# Patient Record
Sex: Female | Born: 1955
Health system: Southern US, Community
[De-identification: ages and names within clinical notes are randomized; demographics above are authoritative.]

## PROBLEM LIST (undated history)

## (undated) DIAGNOSIS — C801 Malignant (primary) neoplasm, unspecified: Secondary | ICD-10-CM

## (undated) DIAGNOSIS — C539 Malignant neoplasm of cervix uteri, unspecified: Secondary | ICD-10-CM

## (undated) DIAGNOSIS — J4520 Mild intermittent asthma, uncomplicated: Secondary | ICD-10-CM

## (undated) DIAGNOSIS — T7840XA Allergy, unspecified, initial encounter: Secondary | ICD-10-CM

## (undated) DIAGNOSIS — K219 Gastro-esophageal reflux disease without esophagitis: Secondary | ICD-10-CM

## (undated) DIAGNOSIS — Z5189 Encounter for other specified aftercare: Secondary | ICD-10-CM

## (undated) DIAGNOSIS — N189 Chronic kidney disease, unspecified: Secondary | ICD-10-CM

## (undated) DIAGNOSIS — Z87442 Personal history of urinary calculi: Secondary | ICD-10-CM

## (undated) HISTORY — DX: Gastro-esophageal reflux disease without esophagitis: K21.9

## (undated) HISTORY — DX: Mild intermittent asthma, uncomplicated: J45.20

## (undated) HISTORY — DX: Chronic kidney disease, unspecified: N18.9

## (undated) HISTORY — DX: Encounter for other specified aftercare: Z51.89

## (undated) HISTORY — DX: Allergy, unspecified, initial encounter: T78.40XA

## (undated) HISTORY — PX: OTHER SURGICAL HISTORY: SHX169

## (undated) HISTORY — PX: HAND SURGERY: SHX662

---
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
## 1998-01-10 ENCOUNTER — Other Ambulatory Visit: Admission: RE | Admit: 1998-01-10 | Discharge: 1998-01-10 | Payer: Self-pay | Admitting: Obstetrics & Gynecology

## 1998-02-12 ENCOUNTER — Ambulatory Visit (HOSPITAL_COMMUNITY): Admission: RE | Admit: 1998-02-12 | Discharge: 1998-02-12 | Payer: Self-pay | Admitting: Obstetrics & Gynecology

## 1998-04-17 ENCOUNTER — Other Ambulatory Visit: Admission: RE | Admit: 1998-04-17 | Discharge: 1998-04-17 | Payer: Self-pay | Admitting: Obstetrics & Gynecology

## 1998-12-09 ENCOUNTER — Other Ambulatory Visit: Admission: RE | Admit: 1998-12-09 | Discharge: 1998-12-09 | Payer: Self-pay | Admitting: Obstetrics & Gynecology

## 2000-03-04 ENCOUNTER — Other Ambulatory Visit: Admission: RE | Admit: 2000-03-04 | Discharge: 2000-03-04 | Payer: Self-pay | Admitting: Obstetrics & Gynecology

## 2001-07-01 ENCOUNTER — Other Ambulatory Visit: Admission: RE | Admit: 2001-07-01 | Discharge: 2001-07-01 | Payer: Self-pay | Admitting: Obstetrics & Gynecology

## 2002-10-19 ENCOUNTER — Other Ambulatory Visit: Admission: RE | Admit: 2002-10-19 | Discharge: 2002-10-19 | Payer: Self-pay | Admitting: Obstetrics & Gynecology

## 2004-03-10 ENCOUNTER — Other Ambulatory Visit: Admission: RE | Admit: 2004-03-10 | Discharge: 2004-03-10 | Payer: Self-pay | Admitting: Obstetrics & Gynecology

## 2005-02-12 ENCOUNTER — Emergency Department (HOSPITAL_COMMUNITY): Admission: EM | Admit: 2005-02-12 | Discharge: 2005-02-12 | Payer: Self-pay | Admitting: Emergency Medicine

## 2006-07-03 ENCOUNTER — Emergency Department (HOSPITAL_COMMUNITY): Admission: EM | Admit: 2006-07-03 | Discharge: 2006-07-04 | Payer: Self-pay | Admitting: Emergency Medicine

## 2006-07-05 ENCOUNTER — Ambulatory Visit: Payer: Self-pay | Admitting: Internal Medicine

## 2006-07-05 ENCOUNTER — Ambulatory Visit (HOSPITAL_COMMUNITY): Admission: RE | Admit: 2006-07-05 | Discharge: 2006-07-06 | Payer: Self-pay | Admitting: Orthopedic Surgery

## 2006-12-17 ENCOUNTER — Ambulatory Visit: Payer: Self-pay | Admitting: Internal Medicine

## 2007-04-29 DIAGNOSIS — K219 Gastro-esophageal reflux disease without esophagitis: Secondary | ICD-10-CM | POA: Insufficient documentation

## 2007-04-29 DIAGNOSIS — J309 Allergic rhinitis, unspecified: Secondary | ICD-10-CM | POA: Insufficient documentation

## 2007-09-21 ENCOUNTER — Ambulatory Visit: Payer: Self-pay | Admitting: Internal Medicine

## 2008-03-05 ENCOUNTER — Emergency Department (HOSPITAL_COMMUNITY): Admission: EM | Admit: 2008-03-05 | Discharge: 2008-03-05 | Payer: Self-pay | Admitting: Emergency Medicine

## 2008-03-13 ENCOUNTER — Ambulatory Visit: Payer: Self-pay | Admitting: Internal Medicine

## 2008-03-13 DIAGNOSIS — M25569 Pain in unspecified knee: Secondary | ICD-10-CM | POA: Insufficient documentation

## 2008-03-14 ENCOUNTER — Ambulatory Visit: Payer: Self-pay | Admitting: Internal Medicine

## 2008-03-26 ENCOUNTER — Telehealth: Payer: Self-pay | Admitting: Internal Medicine

## 2008-03-28 ENCOUNTER — Ambulatory Visit: Payer: Self-pay | Admitting: Internal Medicine

## 2009-03-20 ENCOUNTER — Emergency Department (HOSPITAL_COMMUNITY): Admission: EM | Admit: 2009-03-20 | Discharge: 2009-03-20 | Payer: Self-pay | Admitting: Emergency Medicine

## 2010-12-26 NOTE — Op Note (Signed)
NAMEZAYLEY, ARRAS                 ACCOUNT NO.:  [id number]   MEDICAL RECORD NO.:  [id number]          PATIENT TYPE:  OIB   LOCATION:  5015                         FACILITY:  [facility]   PHYSICIAN:  Nadara Mustard, MD     DATE OF BIRTH:  [birth year]   DATE OF PROCEDURE:  07/05/2006  DATE OF DISCHARGE:                               OPERATIVE REPORT   PREOPERATIVE DIAGNOSIS:  Abscess right hand.   PROCEDURE:  Irrigation debridement abscess right hand.   SURGEON.:  Nadara Mustard, M.D.   ANESTHESIA:  General.   ESTIMATED BLOOD LOSS:  Minimal.   ANTIBIOTICS:  Vancomycin 1 gram started intraoperatively. Preoperatively  the patient was on Augmentin. Cultures were obtained x2.   TOURNIQUET TIME:  None.   DISPOSITION:  To PACU in stable condition.   INDICATIONS FOR PROCEDURE:  The patient is a 55 year old woman who 5  days ago had a fragment of glass get stuck in her right hand.  The  patient went to [facility] emergency room after the  trauma and after she had removed the glass and states that she received  an IM antibiotic injection and was appropriately started on Augmentin.  The patient has had progressive  redness and swelling despite  appropriate antibiotic care and was see my office today. Examination  showed a large swelling abscess over the dorsal aspect of the first web  space right hand.  She also had some swelling in the thenar eminence.  Due to the patient's swelling and abscess despite appropriate antibiotic  treatment, the patient presents at this time for irrigation debridement  of the abscess.  Risks and benefits were discussed including persistent  infection, neurovascular injury, need for additional surgery.  The  patient states she understands and wished proceed at this time.   DESCRIPTION OF PROCEDURE:  The patient was brought to OR room one and  underwent general anesthetic.  After adequate level of anesthesia  obtained the patient's right  upper extremity was prepped using DuraPrep  and draped into a sterile field.  An incision was made over the thenar  eminence in line with the skin creases.  Blunt dissection was carried  down and this was irrigated.  There was no purulence, no abscess, no  foreign body.  A second incision was then made dorsally over the first  web space.  There was a large purulent abscess.  Cultures were obtained  x2.  This was irrigated with normal saline. There was no foreign body.  After irrigation debridement, the extent of the abscess was irrigated  and there was no further pockets of purulence.  A one-quarter inch  Penrose drain was placed in both wounds after irrigation and the wounds  were loosely closed with 2-0 nylon.  The wounds were  covered with Adaptic, orthopedic sponges, sterile Webril, ABD and a  Coban dressing.  The patient was extubated, taken to PACU in stable  condition.  Plan for IV vancomycin. Plan on discharge most likely with  doxycycline with the presumed infection of MRSA and discharge once she  is  stable and the wound is healing and cellulitis resolving.      Nadara Mustard, MD  Electronically Signed     MVD/MEDQ  D:  07/05/2006  T:  07/06/2006  Job:  [id number]

## 2010-12-26 NOTE — Consult Note (Signed)
Laura Mcdonald, Laura Mcdonald                 ACCOUNT NO.:  [id number]   MEDICAL RECORD NO.:  [id number]          PATIENT TYPE:  EMS   LOCATION:  ED                           FACILITY:  [facility]   PHYSICIAN:  Valetta Mole. Swords, MD    DATE OF BIRTH:  06-May-1956   DATE OF CONSULTATION:  DATE OF DISCHARGE:                                 CONSULTATION   DATE OF EVALUATION:  July 04, 2006.   REQUESTING PHYSICIAN:  Dr. Joen Laura.   HISTORY OF PRESENT ILLNESS:  Ms. Halls is a healthy 55 year old female.  While doing outside work 2 days ago she fell on an outside lantern.  The  lantern broke, and a large piece of glass penetrated the webbing between  her first and second fingers on her right hand.  After removing the  glass she soaked the wound in hydrogen peroxide and alcohol.  Over the  past 2 days she has had progressive swelling of the area.  The area  remains uncomfortable.  She has noted swelling and mild erythema of the  hand, especially the palmar aspect next to the thumb.  She has not noted  any streaking erythema or swelling proximal to the hand.  She denies any  fevers or chills.  The pain is rated as 4/10.  It is much better after  taking oral pain relievers in the Emergency Department.  She has also  been given a dose of Unasyn.   PAST MEDICAL HISTORY:  Essentially unremarkable.   MEDICATIONS:  None.   SOCIAL HISTORY:  She owns a Education officer, environmental business.  She is a nonsmoker.   FAMILY HISTORY:  Noncontributory.   REVIEW OF SYSTEMS:  She denies any fevers, chills, chest pain, shortness  of breath, PND, orthopnea, syncope, rashes.  She denies any other  complaints on a complete review of systems.   PHYSICAL EXAMINATION:  Afebrile, blood pressure 124/64, heart rate 80,  respirations 20.  GENERAL:  She appears as a well-developed, well-nourished female in no  acute distress.  HEENT EXAM:  Atraumatic, normocephalic.  NECK:  Supple without lymphadenopathy, thyromegaly, jugular venous  distention, or carotid bruits.  CHEST:  Clear to auscultation without any increased work of breathing.  CARDIAC EXAM:  S1 and S2 are regular.  ABDOMINAL EXAM:  Normoactive bowel sounds, soft, nontender.  EXTREMITIES:  There is no clubbing, cyanosis, or edema.  There is no  axillary or cervical adenopathy.  On the right hand there is some soft  tissue swelling of the right hand on the flexor and extensor aspects.  There is no swelling proximal to the wrist.  The area is somewhat warm.  There is no drainage from the wound site which is between the first and  second fingers.  There is no pitting edema.  There is no streaking  erythema proximal to the wrist.  There is no axillary adenopathy.  She  has full range of motion of her wrist.   ASSESSMENT AND PLAN:  Puncture wound to the hand.  She has had her  tetanus immunization.  She has been given a dose  of Unasyn.  She is not  having any systemic signs or symptoms.  I do not think she needs any  laboratory evaluation at this time.  I think she needs aggressive  antibiotic therapy which has been given with IV Unasyn.  We  will continue p.o. Augmentin.  I will follow the patient up in  approximately 6 hours and will reevaluate her at that time.  She will  take an Augmentin 875 mg p.o. one-time dose in approximately 4 hours.  She will call me if she has any questions.  I have given her my cell  phone number and pager number.      Bruce Rexene Edison Swords, MD  Electronically Signed     BHS/MEDQ  D:  07/04/2006  T:  07/04/2006  Job:  [id number]   cc:   Joen Laura, MD  [address] [place], [place] [address]

## 2019-10-19 ENCOUNTER — Ambulatory Visit: Payer: Medicare Other | Attending: Internal Medicine

## 2019-10-19 DIAGNOSIS — Z23 Encounter for immunization: Secondary | ICD-10-CM

## 2019-10-19 NOTE — Progress Notes (Signed)
   Covid-19 Vaccination Clinic  Name:  Laura Mcdonald    MRN: [id number] DOB: 11/21/1955  10/19/2019  Laura Mcdonald was observed post Covid-19 immunization for 15 minutes without incident. She was provided with Vaccine Information Sheet and instruction to access the V-Safe system.   Laura Mcdonald was instructed to call 911 with any severe reactions post vaccine: Marland Kitchen Difficulty breathing  . Swelling of face and throat  . A fast heartbeat  . A bad rash all over body  . Dizziness and weakness   Immunizations Administered    Name Date Dose VIS Date Route   Pfizer COVID-19 Vaccine 10/19/2019  8:42 AM 0.3 mL 07/21/2019 Intramuscular   Manufacturer: [place]   Lot: [id number]   [place]: [id number]

## 2019-11-06 ENCOUNTER — Other Ambulatory Visit: Payer: Self-pay

## 2019-11-07 ENCOUNTER — Ambulatory Visit (INDEPENDENT_AMBULATORY_CARE_PROVIDER_SITE_OTHER): Payer: Medicare Other | Admitting: Internal Medicine

## 2019-11-07 ENCOUNTER — Encounter: Payer: Self-pay | Admitting: Gastroenterology

## 2019-11-07 ENCOUNTER — Encounter: Payer: Self-pay | Admitting: Internal Medicine

## 2019-11-07 VITALS — BP 140/90 | HR 71 | Temp 97.2°F | Ht 65.5 in | Wt 202.7 lb

## 2019-11-07 DIAGNOSIS — Z1231 Encounter for screening mammogram for malignant neoplasm of breast: Secondary | ICD-10-CM

## 2019-11-07 DIAGNOSIS — R030 Elevated blood-pressure reading, without diagnosis of hypertension: Secondary | ICD-10-CM

## 2019-11-07 DIAGNOSIS — Z13820 Encounter for screening for osteoporosis: Secondary | ICD-10-CM

## 2019-11-07 DIAGNOSIS — E669 Obesity, unspecified: Secondary | ICD-10-CM | POA: Diagnosis not present

## 2019-11-07 DIAGNOSIS — J4520 Mild intermittent asthma, uncomplicated: Secondary | ICD-10-CM

## 2019-11-07 DIAGNOSIS — Z780 Asymptomatic menopausal state: Secondary | ICD-10-CM

## 2019-11-07 DIAGNOSIS — C539 Malignant neoplasm of cervix uteri, unspecified: Secondary | ICD-10-CM | POA: Insufficient documentation

## 2019-11-07 DIAGNOSIS — Z1211 Encounter for screening for malignant neoplasm of colon: Secondary | ICD-10-CM

## 2019-11-07 NOTE — Patient Instructions (Signed)
-  Nice seeing you today!!  -Pneumonia vaccine 6 weeks after last COVID vaccine.  -Schedule follow up for your annual wellness visit as soon as possible.

## 2019-11-07 NOTE — Progress Notes (Signed)
New Patient Office Visit     This visit occurred during the SARS-CoV-2 public health emergency.  Safety protocols were in place, including screening questions prior to the visit, additional usage of staff PPE, and extensive cleaning of exam room while observing appropriate contact time as indicated for disinfecting solutions.    CC/Reason for Visit: Establish care, discuss chronic conditions Previous PCP: None Last Visit: Unknown  HPI: Laura Mcdonald is a 64 y.o. female who is coming in today for the above mentioned reasons. Past Medical History is significant for: Mild intermittent asthma only uses rescue inhaler, and a history of cervical cancer who has not seen her gynecologist in at least 3 years since he retired.  She has no acute complaints today.  She is here to establish care.  She still works part-time in Probation officer.  She has 5 children, all grown.  She was a smoker but quit 36 years ago, no alcohol, no known drug allergies.  Her past surgical history is significant for 2 ectopic pregnancies and a LEEP procedure for cervical cancer.  Family history significant for a mother with a stroke and a father who is deceased from cancer.   Past Medical/Surgical History: Past Medical History:  Diagnosis Date  . Cervical cancer ([place])   . Mild intermittent asthma     History reviewed. No pertinent surgical history.  Social History:  reports that she has quit smoking. She has never used smokeless tobacco. She reports that she does not drink alcohol or use drugs.  Allergies: No Known Allergies  Family History:  Family History  Problem Relation Age of Onset  . CVA Mother   . Lung cancer Father      Current Outpatient Medications:  .  albuterol (VENTOLIN HFA) 108 (90 Base) MCG/ACT inhaler, Inhale into the lungs every 6 (six) hours as needed for wheezing or shortness of breath., Disp: , Rfl:  .  BLACK ELDERBERRY PO, Take by mouth., Disp: , Rfl:  .  cetirizine (ZYRTEC)  10 MG tablet, Take 10 mg by mouth daily., Disp: , Rfl:  .  Multiple Vitamin (MULTIVITAMIN ADULT PO), Take by mouth., Disp: , Rfl:  .  Thiamine HCl (VITAMIN B-1) 250 MG tablet, Take 250 mg by mouth daily., Disp: , Rfl:   Review of Systems:  Constitutional: Denies fever, chills, diaphoresis, appetite change and fatigue.  HEENT: Denies photophobia, eye pain, redness, hearing loss, ear pain, congestion, sore throat, rhinorrhea, sneezing, mouth sores, trouble swallowing, neck pain, neck stiffness and tinnitus.   Respiratory: Denies SOB, DOE, cough, chest tightness,  and wheezing.   Cardiovascular: Denies chest pain, palpitations and leg swelling.  Gastrointestinal: Denies nausea, vomiting, abdominal pain, diarrhea, constipation, blood in stool and abdominal distention.  Genitourinary: Denies dysuria, urgency, frequency, hematuria, flank pain and difficulty urinating.  Endocrine: Denies: hot or cold intolerance, sweats, changes in hair or nails, polyuria, polydipsia. Musculoskeletal: Denies myalgias, back pain, joint swelling, arthralgias and gait problem.  Skin: Denies pallor, rash and wound.  Neurological: Denies dizziness, seizures, syncope, weakness, light-headedness, numbness and headaches.  Hematological: Denies adenopathy. Easy bruising, personal or family bleeding history  Psychiatric/Behavioral: Denies suicidal ideation, mood changes, confusion, nervousness, sleep disturbance and agitation    Physical Exam: Vitals:   11/07/19 1034  BP: 140/90  Pulse: 71  Temp: (!) 97.2 F (36.2 C)  TempSrc: Temporal  SpO2: 97%  Weight: 202 lb 11.2 oz (91.9 kg)  Height: 5' 5.5" (1.664 m)   Body mass index is 33.[place]  kg/m.  Constitutional: NAD, calm, comfortable Eyes: PERRL, lids and conjunctivae normal, wears lenses ENMT: Mucous membranes are moist.  Respiratory: clear to auscultation bilaterally, no wheezing, no crackles. Normal respiratory effort. No accessory muscle use.  Cardiovascular:  Regular rate and rhythm, no murmurs / rubs / gallops. No extremity edema.  Neurologic: Grossly intact and nonfocal Psychiatric: Normal judgment and insight. Alert and oriented x 3. Normal mood.    Impression and Plan:  Mild intermittent asthma without complication -Well-controlled, does not recall the last time she had to use her rescue inhaler.  Screening for malignant neoplasm of colon  - Plan: Ambulatory referral to Gastroenterology  Malignant neoplasm of cervix, unspecified site [name])  - Plan: Ambulatory referral to Gynecology  Obesity (BMI 30.0-34.9) -Discussed healthy lifestyle, including increased physical activity and better food choices to promote weight loss.  Elevated BP without diagnosis of hypertension -His blood pressure today is 140/90. -Have advised ambulatory blood pressure monitoring, will follow at next in office visit.  Screening for osteoporosis -DEXA scan requested  Encounter for screening mammogram for malignant neoplasm of breast  - Plan: MM Digital Screening    Patient Instructions  -Nice seeing you today!!  -Pneumonia vaccine 6 weeks after last COVID vaccine.  -Schedule follow up for your annual wellness visit as soon as possible.       Lelon Frohlich, MD [facility]

## 2019-11-07 NOTE — Addendum Note (Signed)
Addended by: Westley Hummer B on: 11/07/2019 11:09 AM   Modules accepted: Orders

## 2019-11-13 ENCOUNTER — Ambulatory Visit: Payer: Medicare Other | Attending: Internal Medicine

## 2019-11-13 DIAGNOSIS — Z23 Encounter for immunization: Secondary | ICD-10-CM

## 2019-11-13 NOTE — Progress Notes (Signed)
   Covid-19 Vaccination Clinic  Name:  Laura Mcdonald    MRN: [id number] DOB: 05-Jul-1956  11/13/2019  [name] was observed post Covid-19 immunization for 15 minutes without incident. She was provided with Vaccine Information Sheet and instruction to access the V-Safe system.   [name] was instructed to call 911 with any severe reactions post vaccine: Marland Kitchen Difficulty breathing  . Swelling of face and throat  . A fast heartbeat  . A bad rash all over body  . Dizziness and weakness   Immunizations Administered    Name Date Dose VIS Date Route   Pfizer COVID-19 Vaccine 11/13/2019  8:42 AM 0.3 mL 07/21/2019 Intramuscular   Manufacturer: [place]   Lot: [id number]   [place]: [id number]

## 2019-11-29 ENCOUNTER — Ambulatory Visit (AMBULATORY_SURGERY_CENTER): Payer: Self-pay | Admitting: *Deleted

## 2019-11-29 ENCOUNTER — Other Ambulatory Visit: Payer: Self-pay

## 2019-11-29 VITALS — Temp 97.1°F | Ht 65.5 in | Wt 205.0 lb

## 2019-11-29 DIAGNOSIS — Z1211 Encounter for screening for malignant neoplasm of colon: Secondary | ICD-10-CM

## 2019-11-29 MED ORDER — SUTAB 1479-225-188 MG PO TABS: 24.0000 | ORAL_TABLET | ORAL | 0 refills | Status: DC

## 2019-11-29 NOTE — Progress Notes (Signed)
11-13-19 comp covid vaccines   No egg or soy allergy known to patient  No issues with past sedation with any surgeries  or procedures, no intubation problems  No diet pills per patient No home 02 use per patient  No blood thinners per patient  Pt denies issues with constipation  No A fib or A flutter  EMMI video sent to pt's e mail   Due to the COVID-19 pandemic we are asking patients to follow these guidelines. Please only bring one care partner. Please be aware that your care partner may wait in the car in the parking lot or if they feel like they will be too hot to wait in the car, they may wait in the lobby on the 4th floor. All care partners are required to wear a mask the entire time (we do not have any that we can provide them), they need to practice social distancing, and we will do a Covid check for all patient's and care partners when you arrive. Also we will check their temperature and your temperature. If the care partner waits in their car they need to stay in the parking lot the entire time and we will call them on their cell phone when the patient is ready for discharge so they can bring the car to the front of the building. Also all patient's will need to wear a mask into building. Sutab code and coupon

## 2019-12-05 ENCOUNTER — Telehealth: Payer: Self-pay | Admitting: Gastroenterology

## 2019-12-05 DIAGNOSIS — Z1211 Encounter for screening for malignant neoplasm of colon: Secondary | ICD-10-CM

## 2019-12-05 MED ORDER — SUTAB 1479-225-188 MG PO TABS: 24.0000 | ORAL_TABLET | ORAL | 0 refills | Status: DC

## 2019-12-05 NOTE — Telephone Encounter (Signed)
Pt called to inform that [facility] has not yet receive order for sutab. Pt is requesting if prescription can be sent to a different [facility] on [address].

## 2019-12-05 NOTE — Telephone Encounter (Signed)
Sent Sutab to [address]  Lelan Pons pV

## 2019-12-11 NOTE — Telephone Encounter (Signed)
Patient returned your call.

## 2019-12-11 NOTE — Telephone Encounter (Signed)
Pt states that copay for sutab is over $180. She would like something more affordable. Pls call her.

## 2019-12-11 NOTE — Telephone Encounter (Signed)
Coupon Sutab called into [facility], cost $40 per pharmacist.   Patient then called and notified. She will pick it up tomorrow, they are going to order this for her.

## 2019-12-13 ENCOUNTER — Ambulatory Visit (AMBULATORY_SURGERY_CENTER): Payer: Medicare Other | Admitting: Gastroenterology

## 2019-12-13 ENCOUNTER — Encounter: Payer: Self-pay | Admitting: Gastroenterology

## 2019-12-13 ENCOUNTER — Other Ambulatory Visit: Payer: Self-pay

## 2019-12-13 VITALS — BP 152/80 | HR 55 | Temp 96.9°F | Resp 16 | Ht 65.0 in | Wt 205.0 lb

## 2019-12-13 DIAGNOSIS — K635 Polyp of colon: Secondary | ICD-10-CM

## 2019-12-13 DIAGNOSIS — K6289 Other specified diseases of anus and rectum: Secondary | ICD-10-CM

## 2019-12-13 DIAGNOSIS — Z1211 Encounter for screening for malignant neoplasm of colon: Secondary | ICD-10-CM

## 2019-12-13 DIAGNOSIS — D125 Benign neoplasm of sigmoid colon: Secondary | ICD-10-CM

## 2019-12-13 DIAGNOSIS — D122 Benign neoplasm of ascending colon: Secondary | ICD-10-CM

## 2019-12-13 DIAGNOSIS — D123 Benign neoplasm of transverse colon: Secondary | ICD-10-CM

## 2019-12-13 DIAGNOSIS — D128 Benign neoplasm of rectum: Secondary | ICD-10-CM | POA: Diagnosis not present

## 2019-12-13 MED ORDER — SODIUM CHLORIDE 0.9 % IV SOLN: 500.0000 mL | Freq: Once | INTRAVENOUS | Status: DC

## 2019-12-13 NOTE — Op Note (Signed)
[place] Patient Name: Laura Mcdonald Procedure Date: 12/13/2019 7:02 AM MRN: [id number] Endoscopist: Mauri Pole , MD Age: 64 Referring MD:  Date of Birth: [birth year] Gender: Female Account #: [id number] Procedure:                Colonoscopy Indications:              Screening for colorectal malignant neoplasm Medicines:                Monitored Anesthesia Care Procedure:                Pre-Anesthesia Assessment:                           - Prior to the procedure, a History and Physical                            was performed, and patient medications and                            allergies were reviewed. The patient's tolerance of                            previous anesthesia was also reviewed. The risks                            and benefits of the procedure and the sedation                            options and risks were discussed with the patient.                            All questions were answered, and informed consent                            was obtained. Prior Anticoagulants: The patient has                            taken no previous anticoagulant or antiplatelet                            agents. ASA Grade Assessment: II - A patient with                            mild systemic disease. After reviewing the risks                            and benefits, the patient was deemed in                            satisfactory condition to undergo the procedure.                           After obtaining informed consent, the colonoscope  was passed under direct vision. Throughout the                            procedure, the patient's blood pressure, pulse, and                            oxygen saturations were monitored continuously. The                            Colonoscope was introduced through the anus and                            advanced to the the cecum, identified by                            appendiceal orifice and  ileocecal valve. The                            colonoscopy was performed without difficulty. The                            patient tolerated the procedure well. The quality                            of the bowel preparation was excellent. The                            ileocecal valve, appendiceal orifice, and rectum                            were photographed. Scope In: 8:04:53 AM Scope Out: 8:29:51 AM Scope Withdrawal Time: 0 hours 21 minutes 22 seconds  Total Procedure Duration: 0 hours 24 minutes 58 seconds  Findings:                 The perianal and digital rectal examinations were                            normal.                           Four sessile polyps were found in the rectum,                            sigmoid colon and hepatic flexure. The polyps were                            5 to 11 mm in size. These polyps were removed with                            a cold snare. Resection and retrieval were complete.                           Three sessile polyps were found in the transverse  colon and ascending colon. The polyps were 1 to 2                            mm in size. These polyps were removed with a cold                            biopsy forceps. Resection and retrieval were                            complete.                           A 20 mm polypoid lesion was found in the rectum,                            15cm from anal verge. The lesion was infiltrative.                            No bleeding was present. This was biopsied with a                            cold forceps for histology. Opposite fold area was                            tattooed with an injection of 1 mL of Spot (carbon                            black).                           A few small-mouthed diverticula were found in the                            sigmoid colon.                           Non-bleeding internal hemorrhoids were found during                             retroflexion. The hemorrhoids were small. Complications:            No immediate complications. Estimated Blood Loss:     Estimated blood loss was minimal. Impression:               - Four 5 to 11 mm polyps in the rectum, in the                            sigmoid colon and at the hepatic flexure, removed                            with a cold snare. Resected and retrieved.                           - Three 1 to 2 mm polyps in  the transverse colon                            and in the ascending colon, removed with a cold                            biopsy forceps. Resected and retrieved.                           - Rule out malignancy, polypoid lesion in the                            rectum. Biopsied. Tattooed.                           - Diverticulosis in the sigmoid colon.                           - Non-bleeding internal hemorrhoids. Recommendation:           - Patient has a contact number available for                            emergencies. The signs and symptoms of potential                            delayed complications were discussed with the                            patient. Return to normal activities tomorrow.                            Written discharge instructions were provided to the                            patient.                           - Resume previous diet.                           - Continue present medications.                           - Await pathology results.                           - Repeat colonoscopy date to be determined after                            pending pathology results are reviewed for                            surveillance based on pathology results. Mauri Pole, MD 12/13/2019 8:36:57 AM This report has been signed electronically.

## 2019-12-13 NOTE — Patient Instructions (Addendum)
NO NSAIDS FOR 2 WEEKS - NO ASPIRIN ADVIL,MOTRIN,ALEVE  Handouts on polyps, diverticulosis,& hemorrhoids given to you today  Await pathology results on polyps removed and on biopsies done      YOU HAD AN ENDOSCOPIC PROCEDURE TODAY AT [place]:   Refer to the procedure report that was given to you for any specific questions about what was found during the examination.  If the procedure report does not answer your questions, please call your gastroenterologist to clarify.  If you requested that your care partner not be given the details of your procedure findings, then the procedure report has been included in a sealed envelope for you to review at your convenience later.  YOU SHOULD EXPECT: Some feelings of bloating in the abdomen. Passage of more gas than usual.  Walking can help get rid of the air that was put into your GI tract during the procedure and reduce the bloating. If you had a lower endoscopy (such as a colonoscopy or flexible sigmoidoscopy) you may notice spotting of blood in your stool or on the toilet paper. If you underwent a bowel prep for your procedure, you may not have a normal bowel movement for a few days.  Please Note:  You might notice some irritation and congestion in your nose or some drainage.  This is from the oxygen used during your procedure.  There is no need for concern and it should clear up in a day or so.  SYMPTOMS TO REPORT IMMEDIATELY:   Following lower endoscopy (colonoscopy or flexible sigmoidoscopy):  Excessive amounts of blood in the stool  Significant tenderness or worsening of abdominal pains  Swelling of the abdomen that is new, acute  Fever of 100F or higher   Following upper endoscopy (EGD)  Vomiting of blood or coffee ground material  New chest pain or pain under the shoulder blades  Painful or persistently difficult swallowing  New shortness of breath  Fever of 100F or higher  Black, tarry-looking stools  For  urgent or emergent issues, a gastroenterologist can be reached at any hour by calling [phone]. Do not use MyChart messaging for urgent concerns.    DIET:  We do recommend a small meal at first, but then you may proceed to your regular diet.  Drink plenty of fluids but you should avoid alcoholic beverages for 24 hours.  ACTIVITY:  You should plan to take it easy for the rest of today and you should NOT DRIVE or use heavy machinery until tomorrow (because of the sedation medicines used during the test).    FOLLOW UP: Our staff will call the number listed on your records 48-72 hours following your procedure to check on you and address any questions or concerns that you may have regarding the information given to you following your procedure. If we do not reach you, we will leave a message.  We will attempt to reach you two times.  During this call, we will ask if you have developed any symptoms of COVID 19. If you develop any symptoms (ie: fever, flu-like symptoms, shortness of breath, cough etc.) before then, please call [phone].  If you test positive for Covid 19 in the 2 weeks post procedure, please call and report this information to [place].    If any biopsies were taken you will be contacted by phone or by letter within the next 1-3 weeks.  Please call us at [phone] if you have not heard about the biopsies in 3  weeks.    SIGNATURES/CONFIDENTIALITY: You and/or your care partner have signed paperwork which will be entered into your electronic medical record.  These signatures attest to the fact that that the information above on your After Visit Summary has been reviewed and is understood.  Full responsibility of the confidentiality of this discharge information lies with you and/or your care-partner.

## 2019-12-13 NOTE — Progress Notes (Signed)
Temp check by:JB Vital check by:DT  The patient states no changes in medical or surgical history since pre-visit screening on 11/29/19.

## 2019-12-13 NOTE — Progress Notes (Signed)
A and O x3. Report to RN. Tolerated MAC anesthesia well.

## 2019-12-13 NOTE — Progress Notes (Signed)
Called to room to assist during endoscopic procedure.  Patient ID and intended procedure confirmed with present staff. Received instructions for my participation in the procedure from the performing physician.  

## 2019-12-15 ENCOUNTER — Telehealth: Payer: Self-pay | Admitting: *Deleted

## 2019-12-15 NOTE — Telephone Encounter (Signed)
  Follow up Call-  Call back number 12/13/2019  Post procedure Call Back phone  # [phone]  Permission to leave phone message Yes  Some recent data might be hidden     Patient questions:  Do you have a fever, pain , or abdominal swelling? No. Pain Score  0 *  Have you tolerated food without any problems? Yes.    Have you been able to return to your normal activities? Yes.    Do you have any questions about your discharge instructions: Diet   No. Medications  No. Follow up visit  No.  Do you have questions or concerns about your Care? No.  Actions: * If pain score is 4 or above: No action needed, pain <4.  1. Have you developed a fever since your procedure? no  2.   Have you had an respiratory symptoms (SOB or cough) since your procedure? no  3.   Have you tested positive for COVID 19 since your procedure no  4.   Have you had any family members/close contacts diagnosed with the COVID 19 since your procedure?  no   If yes to any of these questions please route to Joylene John, RN and Erenest Rasher, RN

## 2019-12-28 ENCOUNTER — Ambulatory Visit (INDEPENDENT_AMBULATORY_CARE_PROVIDER_SITE_OTHER): Payer: Medicare Other | Admitting: Gastroenterology

## 2019-12-28 ENCOUNTER — Encounter: Payer: Self-pay | Admitting: Gastroenterology

## 2019-12-28 ENCOUNTER — Other Ambulatory Visit (INDEPENDENT_AMBULATORY_CARE_PROVIDER_SITE_OTHER): Payer: Medicare Other

## 2019-12-28 VITALS — BP 140/74 | HR 68 | Ht 65.5 in | Wt 205.4 lb

## 2019-12-28 DIAGNOSIS — D128 Benign neoplasm of rectum: Secondary | ICD-10-CM

## 2019-12-28 DIAGNOSIS — K621 Rectal polyp: Secondary | ICD-10-CM | POA: Diagnosis not present

## 2019-12-28 DIAGNOSIS — R933 Abnormal findings on diagnostic imaging of other parts of digestive tract: Secondary | ICD-10-CM

## 2019-12-28 LAB — BASIC METABOLIC PANEL
BUN: 13 mg/dL (ref 6–23)
CO2: 24 mEq/L (ref 19–32)
Calcium: 10.3 mg/dL (ref 8.4–10.5)
Chloride: 108 mEq/L (ref 96–112)
Creatinine, Ser: 0.81 mg/dL (ref 0.40–1.20)
GFR: 85.57 mL/min (ref >60.00)
Glucose, Bld: 105 mg/dL — ABNORMAL HIGH (ref 70–99)
Potassium: 3.9 mEq/L (ref 3.5–5.1)
Sodium: 137 mEq/L (ref 135–145)

## 2019-12-28 LAB — PROTIME-INR
INR: 1.1 ratio — ABNORMAL HIGH (ref 0.8–1.0)
Prothrombin Time: 12.2 s (ref 9.6–13.1)

## 2019-12-28 LAB — CBC
HCT: 41.4 % (ref 36.0–46.0)
Hemoglobin: 14 g/dL (ref 12.0–15.0)
MCHC: 33.7 g/dL (ref 30.0–36.0)
MCV: 86.9 fl (ref 78.0–100.0)
Platelets: 311 10*3/uL (ref 150.0–400.0)
RBC: 4.77 Mil/uL (ref 3.87–5.11)
RDW: 13.4 % (ref 11.5–15.5)
WBC: 9.5 10*3/uL (ref 4.0–10.5)

## 2019-12-28 MED ORDER — SUTAB 1479-225-188 MG PO TABS: 24.0000 | ORAL_TABLET | ORAL | 0 refills | Status: DC

## 2019-12-28 NOTE — Patient Instructions (Addendum)
You have been scheduled for an endoscopy. Please follow written instructions given to you at your visit today. If you use inhalers (even only as needed), please bring them with you on the day of your procedure.   If you are age 66 or older, your body mass index should be between 23-30. Your Body mass index is 33.66 kg/m. If this is out of the aforementioned range listed, please consider follow up with your Primary Care Provider.  If you are age 76 or younger, your body mass index should be between 19-25. Your Body mass index is 33.66 kg/m. If this is out of the aformentioned range listed, please consider follow up with your Primary Care Provider.   We have sent the following medications to your pharmacy for you to pick up at your convenience: Sutab  Your provider has requested that you go to the basement level for lab work before leaving today. Press "B" on the elevator. The lab is located at the first door on the left as you exit the elevator.  Due to recent changes in healthcare laws, you may see the results of your imaging and laboratory studies on MyChart before your provider has had a chance to review them.  We understand that in some cases there may be results that are confusing or concerning to you. Not all laboratory results come back in the same time frame and the provider may be waiting for multiple results in order to interpret others.  Please give [place] 48 hours in order for your provider to thoroughly review all the results before contacting the office for clarification of your results.   Thank you for choosing me and [facility].  Dr. Rush Landmark

## 2019-12-31 DIAGNOSIS — K621 Rectal polyp: Secondary | ICD-10-CM | POA: Insufficient documentation

## 2019-12-31 DIAGNOSIS — R933 Abnormal findings on diagnostic imaging of other parts of digestive tract: Secondary | ICD-10-CM | POA: Insufficient documentation

## 2019-12-31 NOTE — Progress Notes (Signed)
GASTROENTEROLOGY OUTPATIENT CLINIC VISIT   Primary Care Provider Isaac Bliss, Rayford Halsted, MD [place] [address] [phone]  Referring Provider Dr. Silverio Decamp  Patient Profile: Laura Mcdonald is a 64 y.o. female with a pmh significant for allergies, asthma, prior cervical cancer, GERD, diverticulosis, adenomatous colon polyps.  The patient presents to the [facility] for an evaluation and management of problem(s) noted below:  Problem List 1. Adenomatous rectal polyp   2. Rectal polyp   3. Abnormal colonoscopy     History of Present Illness The patient presented earlier this month to Dr. Silverio Decamp for a screening colonoscopy.  At that time multiple polyps were found as well as abnormal polyp in the proximal rectum.  This was biopsied and returned as a tubular adenoma with high-grade dysplasia.  Due to the size as well as the surrounding mucosa, it was felt that an attempt at advanced polyp resection may be best.  It is for this reason that the patient is referred for further discussion of potential polypectomy.  Patient has no other significant GI complaints at this time.  She does have some concern regards to the finding but is hopeful that this can be taken care of.  She understands that this is a precancerous lesion.  GI Review of Systems Positive as above Negative for dysphagia, odynophagia, pain, change in bowel habits, melena, hematochezia  Review of Systems General: Denies fevers/chills/weight loss unintentionally HEENT: Denies oral lesions Cardiovascular: Denies chest pain Pulmonary: Denies shortness of breath Gastroenterological: See HPI Genitourinary: Denies darkened urine Hematological: Denies easy bruising/bleeding Dermatological: Denies jaundice Psychological: Mood is stable   Medications Current Outpatient Medications  Medication Sig Dispense Refill  . albuterol (VENTOLIN HFA) 108 (90 Base) MCG/ACT inhaler  Inhale into the lungs every 6 (six) hours as needed for wheezing or shortness of breath.    . APPLE CIDER VINEGAR PO Take by mouth. 2 gummies a day    . BLACK ELDERBERRY PO Take by mouth.    . cetirizine (ZYRTEC) 10 MG tablet Take 10 mg by mouth daily.    . cholecalciferol (VITAMIN D3) 25 MCG (1000 UNIT) tablet Take 1,000 Units by mouth daily.    . Multiple Vitamin (MULTIVITAMIN ADULT PO) Take by mouth.    . Thiamine HCl (VITAMIN B-1) 250 MG tablet Take 250 mg by mouth daily.    . Sodium Sulfate-Mag Sulfate-KCl (SUTAB) [phone] MG TABS Take 24 tablets by mouth as directed. 24 tablet 0   No current facility-administered medications for this visit.    Allergies No Known Allergies  Histories Past Medical History:  Diagnosis Date  . Allergy   . Cervical cancer ([place])   . GERD (gastroesophageal reflux disease)    past hx but better wirg weight loss   . Mild intermittent asthma    Past Surgical History:  Procedure Laterality Date  . cervical cancer surgery  1995~   Dr Stann Mainland   . HAND SURGERY Right   . tubal preganancy     Social History   Socioeconomic History  . Marital status: Married    Spouse name: Not on file  . Number of children: Not on file  . Years of education: Not on file  . Highest education level: Not on file  Occupational History  . Not on file  Tobacco Use  . Smoking status: Former Research scientist (life sciences)  . Smokeless tobacco: Never Used  Substance and Sexual Activity  . Alcohol use: Never  . Drug use: Never  .  Sexual activity: Not on file  Other Topics Concern  . Not on file  Social History Narrative  . Not on file   Social Determinants of Health   Financial Resource Strain:   . Difficulty of Paying Living Expenses:   Food Insecurity:   . Worried About Charity fundraiser in the Last Year:   . Arboriculturist in the Last Year:   Transportation Needs:   . Film/video editor (Medical):   Marland Kitchen Lack of Transportation (Non-Medical):   Physical Activity:   . Days  of Exercise per Week:   . Minutes of Exercise per Session:   Stress:   . Feeling of Stress :   Social Connections:   . Frequency of Communication with Friends and Family:   . Frequency of Social Gatherings with Friends and Family:   . Attends Religious Services:   . Active Member of Clubs or Organizations:   . Attends Archivist Meetings:   Marland Kitchen Marital Status:   Intimate Partner Violence:   . Fear of Current or Ex-Partner:   . Emotionally Abused:   Marland Kitchen Physically Abused:   . Sexually Abused:    Family History  Problem Relation Age of Onset  . CVA Mother   . Lung cancer Father   . Colon cancer Neg Hx   . Colon polyps Neg Hx   . Esophageal cancer Neg Hx   . Stomach cancer Neg Hx   . Rectal cancer Neg Hx    I have reviewed her medical, social, and family history in detail and updated the electronic medical record as necessary.    PHYSICAL EXAMINATION  BP 140/74   Pulse 68   Ht 5' 5.5" (1.664 m)   Wt 205 lb 6.4 oz (93.2 kg)   BMI 33.66 kg/m  Wt Readings from Last 3 Encounters:  12/28/19 205 lb 6.4 oz (93.2 kg)  12/13/19 205 lb (93 kg)  11/29/19 205 lb (93 kg)  GEN: NAD, appears stated age, doesn't appear chronically ill PSYCH: Cooperative, without pressured speech EYE: Conjunctivae pink, sclerae anicteric ENT: MMM CV: Nontachycardic RESP: No audible wheezing present GI: Soft, NT/ND, without rebound or guarding MSK/EXT: No lower extremity edema SKIN: No jaundice NEURO:  Alert & Oriented x 3, no focal deficits   REVIEW OF DATA  I reviewed the following data at the time of this encounter:  GI Procedures and Studies  May 2021 colonoscopy - Four 5 to 11 mm polyps in the rectum, in the sigmoid colon and at the hepatic flexure, removed with a cold snare. Resected and retrieved. - Three 1 to 2 mm polyps in the transverse colon and in the ascending colon, removed with a cold biopsy forceps. Resected and retrieved. - Rule out malignancy, polypoid lesion in the  rectum. Biopsied. Tattooed. - Diverticulosis in the sigmoid colon. - Non-bleeding internal hemorrhoids. Pathology Diagnosis 1. Colon, polyp(s), transverse, hepatic flexure and ascending, polyp (4) - TUBULAR ADENOMA(S) WITHOUT HIGH-GRADE DYSPLASIA OR MALIGNANCY - SESSILE SERRATED ADENOMA(S) WITHOUT CYTOLOGIC DYSPLASIA 2. Colon, polyp(s), sigmoid, rectum, polyps (3) - HYPERPLASTIC POLYP(S) 3. Rectum, biopsy, polyp - TUBULAR ADENOMA(S) WITH HIGH-GRADE DYSPLASIA. SEE NOTE  Laboratory Studies  Reviewed those in epic  Imaging Studies  No relevant studies to review   ASSESSMENT  Laura Mcdonald is a 64 y.o. female with a pmh significant for allergies, asthma, prior cervical cancer, GERD, diverticulosis, adenomatous colon polyps.  The patient is seen today for evaluation and management of:  1. Adenomatous rectal polyp  2. Rectal polyp   3. Abnormal colonoscopy    The patient is clinically and hemodynamically stable.  Based upon the description and endoscopic pictures I do feel that it is reasonable to pursue an Advanced Polypectomy attempt of the polyp/lesion.  We discussed some of the techniques of advanced polypectomy which include Endoscopic Mucosal Resection, OVESCO Full-Thickness Resection, Endorotor Morcellation, and Tissue Ablation via Fulguration.  The risks and benefits of endoscopic evaluation were discussed with the patient; these include but are not limited to the risk of perforation, infection, bleeding, missed lesions, lack of diagnosis, severe illness requiring hospitalization, as well as anesthesia and sedation related illnesses.  During attempts at advanced resection, the risks of bleeding and perforation/leak are increased as opposed to diagnostic and screening procedures, and that was discussed with the patient as well.   In addition, I explained that with the possible need for piecemeal resection, subsequent short-interval endoscopic evaluation for follow up and potential  retreatment of the lesion/area may be necessary.  I did offer, a referral to surgery in order for patient to have opportunity to discuss surgical management/intervention prior to finalizing decision for attempt at endoscopic removal, however, the patient deferred on this.  If, after attempt at removal of the polyp/lesion, it is found that the patient has a complication or that an invasive lesion or malignant lesion is found, or that the polyp/lesion continues to recur, the patient is aware and understands that surgery may still be indicated/required.  All patient questions were answered, to the best of my ability, and the patient agrees to the aforementioned plan of action with follow-up as indicated.   PLAN  Preprocedure laboratories as outlined below Proceed with scheduling colonoscopy with EMR Follow-up based on final pathology based on resection attempt   Orders Placed This Encounter  Procedures  . Procedural/ Surgical Case Request: COLONOSCOPY WITH PROPOFOL, ENDOSCOPIC MUCOSAL RESECTION  . CBC  . Basic Metabolic Panel (BMET)  . INR/PT  . Ambulatory referral to Gastroenterology    New Prescriptions   SODIUM SULFATE-MAG SULFATE-KCL (SUTAB) [phone] MG TABS    Take 24 tablets by mouth as directed.   Modified Medications   No medications on file    Planned Follow Up No follow-ups on file.   Total Time in Face-to-Face and in Coordination of Care for patient including independent/personal interpretation/review of prior testing, medical history, examination, medication adjustment, communicating results with the patient directly, and documentation with the EHR is 30 minutes.   Justice Britain, MD [facility] Advanced Endoscopy Office # [id number]

## 2019-12-31 NOTE — H&P (View-Only) (Signed)
GASTROENTEROLOGY OUTPATIENT CLINIC VISIT   Primary Care Provider Isaac Bliss, Rayford Halsted, MD [place] [address] [phone]  Referring Provider Dr. Silverio Decamp  Patient Profile: Laura Mcdonald is a 64 y.o. female with a pmh significant for allergies, asthma, prior cervical cancer, GERD, diverticulosis, adenomatous colon polyps.  The patient presents to the [facility] for an evaluation and management of problem(s) noted below:  Problem List 1. Adenomatous rectal polyp   2. Rectal polyp   3. Abnormal colonoscopy     History of Present Illness The patient presented earlier this month to Dr. Silverio Decamp for a screening colonoscopy.  At that time multiple polyps were found as well as abnormal polyp in the proximal rectum.  This was biopsied and returned as a tubular adenoma with high-grade dysplasia.  Due to the size as well as the surrounding mucosa, it was felt that an attempt at advanced polyp resection may be best.  It is for this reason that the patient is referred for further discussion of potential polypectomy.  Patient has no other significant GI complaints at this time.  She does have some concern regards to the finding but is hopeful that this can be taken care of.  She understands that this is a precancerous lesion.  GI Review of Systems Positive as above Negative for dysphagia, odynophagia, pain, change in bowel habits, melena, hematochezia  Review of Systems General: Denies fevers/chills/weight loss unintentionally HEENT: Denies oral lesions Cardiovascular: Denies chest pain Pulmonary: Denies shortness of breath Gastroenterological: See HPI Genitourinary: Denies darkened urine Hematological: Denies easy bruising/bleeding Dermatological: Denies jaundice Psychological: Mood is stable   Medications Current Outpatient Medications  Medication Sig Dispense Refill  . albuterol (VENTOLIN HFA) 108 (90 Base) MCG/ACT inhaler  Inhale into the lungs every 6 (six) hours as needed for wheezing or shortness of breath.    . APPLE CIDER VINEGAR PO Take by mouth. 2 gummies a day    . BLACK ELDERBERRY PO Take by mouth.    . cetirizine (ZYRTEC) 10 MG tablet Take 10 mg by mouth daily.    . cholecalciferol (VITAMIN D3) 25 MCG (1000 UNIT) tablet Take 1,000 Units by mouth daily.    . Multiple Vitamin (MULTIVITAMIN ADULT PO) Take by mouth.    . Thiamine HCl (VITAMIN B-1) 250 MG tablet Take 250 mg by mouth daily.    . Sodium Sulfate-Mag Sulfate-KCl (SUTAB) [phone] MG TABS Take 24 tablets by mouth as directed. 24 tablet 0   No current facility-administered medications for this visit.    Allergies No Known Allergies  Histories Past Medical History:  Diagnosis Date  . Allergy   . Cervical cancer ([place])   . GERD (gastroesophageal reflux disease)    past hx but better wirg weight loss   . Mild intermittent asthma    Past Surgical History:  Procedure Laterality Date  . cervical cancer surgery  1995~   Dr Stann Mainland   . HAND SURGERY Right   . tubal preganancy     Social History   Socioeconomic History  . Marital status: Married    Spouse name: Not on file  . Number of children: Not on file  . Years of education: Not on file  . Highest education level: Not on file  Occupational History  . Not on file  Tobacco Use  . Smoking status: Former Research scientist (life sciences)  . Smokeless tobacco: Never Used  Substance and Sexual Activity  . Alcohol use: Never  . Drug use: Never  .  Sexual activity: Not on file  Other Topics Concern  . Not on file  Social History Narrative  . Not on file   Social Determinants of Health   Financial Resource Strain:   . Difficulty of Paying Living Expenses:   Food Insecurity:   . Worried About Charity fundraiser in the Last Year:   . Arboriculturist in the Last Year:   Transportation Needs:   . Film/video editor (Medical):   Marland Kitchen Lack of Transportation (Non-Medical):   Physical Activity:   . Days  of Exercise per Week:   . Minutes of Exercise per Session:   Stress:   . Feeling of Stress :   Social Connections:   . Frequency of Communication with Friends and Family:   . Frequency of Social Gatherings with Friends and Family:   . Attends Religious Services:   . Active Member of Clubs or Organizations:   . Attends Archivist Meetings:   Marland Kitchen Marital Status:   Intimate Partner Violence:   . Fear of Current or Ex-Partner:   . Emotionally Abused:   Marland Kitchen Physically Abused:   . Sexually Abused:    Family History  Problem Relation Age of Onset  . CVA Mother   . Lung cancer Father   . Colon cancer Neg Hx   . Colon polyps Neg Hx   . Esophageal cancer Neg Hx   . Stomach cancer Neg Hx   . Rectal cancer Neg Hx    I have reviewed her medical, social, and family history in detail and updated the electronic medical record as necessary.    PHYSICAL EXAMINATION  BP 140/74   Pulse 68   Ht 5' 5.5" (1.664 m)   Wt 205 lb 6.4 oz (93.2 kg)   BMI 33.66 kg/m  Wt Readings from Last 3 Encounters:  12/28/19 205 lb 6.4 oz (93.2 kg)  12/13/19 205 lb (93 kg)  11/29/19 205 lb (93 kg)  GEN: NAD, appears stated age, doesn't appear chronically ill PSYCH: Cooperative, without pressured speech EYE: Conjunctivae pink, sclerae anicteric ENT: MMM CV: Nontachycardic RESP: No audible wheezing present GI: Soft, NT/ND, without rebound or guarding MSK/EXT: No lower extremity edema SKIN: No jaundice NEURO:  Alert & Oriented x 3, no focal deficits   REVIEW OF DATA  I reviewed the following data at the time of this encounter:  GI Procedures and Studies  May 2021 colonoscopy - Four 5 to 11 mm polyps in the rectum, in the sigmoid colon and at the hepatic flexure, removed with a cold snare. Resected and retrieved. - Three 1 to 2 mm polyps in the transverse colon and in the ascending colon, removed with a cold biopsy forceps. Resected and retrieved. - Rule out malignancy, polypoid lesion in the  rectum. Biopsied. Tattooed. - Diverticulosis in the sigmoid colon. - Non-bleeding internal hemorrhoids. Pathology Diagnosis 1. Colon, polyp(s), transverse, hepatic flexure and ascending, polyp (4) - TUBULAR ADENOMA(S) WITHOUT HIGH-GRADE DYSPLASIA OR MALIGNANCY - SESSILE SERRATED ADENOMA(S) WITHOUT CYTOLOGIC DYSPLASIA 2. Colon, polyp(s), sigmoid, rectum, polyps (3) - HYPERPLASTIC POLYP(S) 3. Rectum, biopsy, polyp - TUBULAR ADENOMA(S) WITH HIGH-GRADE DYSPLASIA. SEE NOTE  Laboratory Studies  Reviewed those in epic  Imaging Studies  No relevant studies to review   ASSESSMENT  Ms. Basch is a 64 y.o. female with a pmh significant for allergies, asthma, prior cervical cancer, GERD, diverticulosis, adenomatous colon polyps.  The patient is seen today for evaluation and management of:  1. Adenomatous rectal polyp  2. Rectal polyp   3. Abnormal colonoscopy    The patient is clinically and hemodynamically stable.  Based upon the description and endoscopic pictures I do feel that it is reasonable to pursue an Advanced Polypectomy attempt of the polyp/lesion.  We discussed some of the techniques of advanced polypectomy which include Endoscopic Mucosal Resection, OVESCO Full-Thickness Resection, Endorotor Morcellation, and Tissue Ablation via Fulguration.  The risks and benefits of endoscopic evaluation were discussed with the patient; these include but are not limited to the risk of perforation, infection, bleeding, missed lesions, lack of diagnosis, severe illness requiring hospitalization, as well as anesthesia and sedation related illnesses.  During attempts at advanced resection, the risks of bleeding and perforation/leak are increased as opposed to diagnostic and screening procedures, and that was discussed with the patient as well.   In addition, I explained that with the possible need for piecemeal resection, subsequent short-interval endoscopic evaluation for follow up and potential  retreatment of the lesion/area may be necessary.  I did offer, a referral to surgery in order for patient to have opportunity to discuss surgical management/intervention prior to finalizing decision for attempt at endoscopic removal, however, the patient deferred on this.  If, after attempt at removal of the polyp/lesion, it is found that the patient has a complication or that an invasive lesion or malignant lesion is found, or that the polyp/lesion continues to recur, the patient is aware and understands that surgery may still be indicated/required.  All patient questions were answered, to the best of my ability, and the patient agrees to the aforementioned plan of action with follow-up as indicated.   PLAN  Preprocedure laboratories as outlined below Proceed with scheduling colonoscopy with EMR Follow-up based on final pathology based on resection attempt   Orders Placed This Encounter  Procedures  . Procedural/ Surgical Case Request: COLONOSCOPY WITH PROPOFOL, ENDOSCOPIC MUCOSAL RESECTION  . CBC  . Basic Metabolic Panel (BMET)  . INR/PT  . Ambulatory referral to Gastroenterology    New Prescriptions   SODIUM SULFATE-MAG SULFATE-KCL (SUTAB) [phone] MG TABS    Take 24 tablets by mouth as directed.   Modified Medications   No medications on file    Planned Follow Up No follow-ups on file.   Total Time in Face-to-Face and in Coordination of Care for patient including independent/personal interpretation/review of prior testing, medical history, examination, medication adjustment, communicating results with the patient directly, and documentation with the EHR is 30 minutes.   Justice Britain, MD [facility] Office # [phone]

## 2020-01-05 ENCOUNTER — Other Ambulatory Visit: Payer: Self-pay | Admitting: Obstetrics & Gynecology

## 2020-01-05 DIAGNOSIS — R928 Other abnormal and inconclusive findings on diagnostic imaging of breast: Secondary | ICD-10-CM

## 2020-01-11 ENCOUNTER — Ambulatory Visit
Admission: RE | Admit: 2020-01-11 | Discharge: 2020-01-11 | Disposition: A | Discharge status: Home / Self Care | Payer: Medicare Other | Source: Ambulatory Visit | Attending: Obstetrics & Gynecology | Admitting: Obstetrics & Gynecology

## 2020-01-11 ENCOUNTER — Other Ambulatory Visit (HOSPITAL_COMMUNITY)
Admission: RE | Admit: 2020-01-11 | Discharge: 2020-01-11 | Disposition: A | Discharge status: Home / Self Care | Payer: Medicare Other | Source: Ambulatory Visit | Attending: Gastroenterology | Admitting: Gastroenterology

## 2020-01-11 ENCOUNTER — Other Ambulatory Visit: Payer: Self-pay

## 2020-01-11 DIAGNOSIS — Z20822 Contact with and (suspected) exposure to covid-19: Secondary | ICD-10-CM | POA: Insufficient documentation

## 2020-01-11 DIAGNOSIS — R928 Other abnormal and inconclusive findings on diagnostic imaging of breast: Secondary | ICD-10-CM

## 2020-01-11 DIAGNOSIS — Z01812 Encounter for preprocedural laboratory examination: Secondary | ICD-10-CM | POA: Insufficient documentation

## 2020-01-11 LAB — SARS CORONAVIRUS 2 (TAT 6-24 HRS): SARS Coronavirus 2: NEGATIVE

## 2020-01-12 NOTE — Progress Notes (Addendum)
I was unable to reach Laura Mcdonald by phone.  I left  A message on voice mail.  I instructed the patient to arrive at [facility] entrance at 1215  , register in the [facility]. Do not eat after midnight, follow prep.  I  instructed the patient to take the following medications: Zyrtec.; Albuterol inhaler if needed- bring it with you in the am with just enough water to get them down:  I asked patient to shower, not wear any lotions, powders, cologne, jewelry, piercing, make-up or nail polish.  Wear clean clothes. Brush teeth. I informed patient that there will need to be a driver and someone to stay with him/her for the first 24 hours after procedure   I instructed  patient to call [phone] in the am if there were any questions or problems.

## 2020-01-15 ENCOUNTER — Ambulatory Visit (HOSPITAL_COMMUNITY): Payer: Medicare Other | Admitting: Anesthesiology

## 2020-01-15 ENCOUNTER — Encounter (HOSPITAL_COMMUNITY): Payer: Self-pay | Admitting: Gastroenterology

## 2020-01-15 ENCOUNTER — Other Ambulatory Visit: Payer: Self-pay

## 2020-01-15 ENCOUNTER — Encounter (HOSPITAL_COMMUNITY)
Admission: RE | Disposition: A | Discharge status: Home / Self Care | Payer: Self-pay | Source: Home / Self Care | Attending: Gastroenterology

## 2020-01-15 ENCOUNTER — Ambulatory Visit (HOSPITAL_COMMUNITY)
Admission: RE | Admit: 2020-01-15 | Discharge: 2020-01-15 | Disposition: A | Discharge status: Home / Self Care | Payer: Medicare Other | Attending: Gastroenterology | Admitting: Gastroenterology

## 2020-01-15 DIAGNOSIS — J4520 Mild intermittent asthma, uncomplicated: Secondary | ICD-10-CM | POA: Diagnosis not present

## 2020-01-15 DIAGNOSIS — D128 Benign neoplasm of rectum: Secondary | ICD-10-CM | POA: Diagnosis present

## 2020-01-15 DIAGNOSIS — D123 Benign neoplasm of transverse colon: Secondary | ICD-10-CM | POA: Insufficient documentation

## 2020-01-15 DIAGNOSIS — K5140 Inflammatory polyps of colon without complications: Secondary | ICD-10-CM | POA: Diagnosis not present

## 2020-01-15 DIAGNOSIS — C20 Malignant neoplasm of rectum: Secondary | ICD-10-CM | POA: Insufficient documentation

## 2020-01-15 DIAGNOSIS — K641 Second degree hemorrhoids: Secondary | ICD-10-CM | POA: Insufficient documentation

## 2020-01-15 DIAGNOSIS — Z87891 Personal history of nicotine dependence: Secondary | ICD-10-CM | POA: Diagnosis not present

## 2020-01-15 DIAGNOSIS — D122 Benign neoplasm of ascending colon: Secondary | ICD-10-CM | POA: Diagnosis not present

## 2020-01-15 DIAGNOSIS — K635 Polyp of colon: Secondary | ICD-10-CM

## 2020-01-15 HISTORY — PX: COLONOSCOPY WITH PROPOFOL: SHX5780

## 2020-01-15 HISTORY — PX: BIOPSY: SHX5522

## 2020-01-15 HISTORY — PX: SUBMUCOSAL LIFTING INJECTION: SHX6855

## 2020-01-15 SURGERY — COLONOSCOPY WITH PROPOFOL
Anesthesia: Monitor Anesthesia Care

## 2020-01-15 MED ORDER — LACTATED RINGERS IV SOLN: INTRAVENOUS | Status: DC

## 2020-01-15 MED ORDER — FENTANYL CITRATE (PF) 250 MCG/5ML IJ SOLN
INTRAMUSCULAR | Status: DC | PRN
  Administered 2020-01-15 (×2): 25 ug via INTRAVENOUS

## 2020-01-15 MED ORDER — PROPOFOL 10 MG/ML IV BOLUS
INTRAVENOUS | Status: DC | PRN
  Administered 2020-01-15: 15 mg via INTRAVENOUS
  Administered 2020-01-15: 25 mg via INTRAVENOUS

## 2020-01-15 MED ORDER — PROPOFOL 500 MG/50ML IV EMUL
INTRAVENOUS | Status: DC | PRN
  Administered 2020-01-15 (×2): 200 ug/kg/min via INTRAVENOUS
  Administered 2020-01-15: 150 ug/kg/min via INTRAVENOUS

## 2020-01-15 MED ORDER — GLYCOPYRROLATE PF 0.2 MG/ML IJ SOSY
PREFILLED_SYRINGE | INTRAMUSCULAR | Status: DC | PRN
Start: 2020-01-15 — End: 2020-01-15
  Administered 2020-01-15: .2 mg via INTRAVENOUS

## 2020-01-15 MED ORDER — FENTANYL CITRATE (PF) 100 MCG/2ML IJ SOLN
INTRAMUSCULAR | Status: AC
  Filled 2020-01-15: qty 2

## 2020-01-15 MED ORDER — PHENYLEPHRINE 40 MCG/ML (10ML) SYRINGE FOR IV PUSH (FOR BLOOD PRESSURE SUPPORT)
PREFILLED_SYRINGE | INTRAVENOUS | Status: DC | PRN
  Administered 2020-01-15: 160 ug via INTRAVENOUS
  Administered 2020-01-15: 120 ug via INTRAVENOUS

## 2020-01-15 SURGICAL SUPPLY — 22 items

## 2020-01-15 NOTE — Anesthesia Postprocedure Evaluation (Signed)
Anesthesia Post Note  Patient: Laura Mcdonald  Procedure(s) Performed: COLONOSCOPY WITH PROPOFOL (N/A ) SUBMUCOSAL LIFTING INJECTION BIOPSY     Patient location during evaluation: Endoscopy Anesthesia Type: MAC Level of consciousness: awake and alert Pain management: pain level controlled Vital Signs Assessment: post-procedure vital signs reviewed and stable Respiratory status: spontaneous breathing, nonlabored ventilation and respiratory function stable Cardiovascular status: stable and blood pressure returned to baseline Postop Assessment: no apparent nausea or vomiting Anesthetic complications: no    Last Vitals:  Vitals:   01/15/20 1426 01/15/20 1446  BP: 129/62 (!) 152/79  Pulse: 75   Resp: 12 15  Temp:    SpO2: 100% 100%    Last Pain:  Vitals:   01/15/20 1405  TempSrc: Axillary  PainSc: 0-No pain                 Sky Primo,W. EDMOND

## 2020-01-15 NOTE — Interval H&P Note (Signed)
History and Physical Interval Note:  01/15/2020 12:16 PM  Laura Mcdonald  has presented today for surgery, with the diagnosis of Rectal TA, Rectal Polyp.  The various methods of treatment have been discussed with the patient and family. After consideration of risks, benefits and other options for treatment, the patient has consented to  Procedure(s): COLONOSCOPY WITH PROPOFOL (N/A) ENDOSCOPIC MUCOSAL RESECTION (N/A) as a surgical intervention.  The patient's history has been reviewed, patient examined, no change in status, stable for surgery.  I have reviewed the patient's chart and labs.  Questions were answered to the patient's satisfaction.     [employer]

## 2020-01-15 NOTE — Transfer of Care (Signed)
Immediate Anesthesia Transfer of Care Note  Patient: Laura Mcdonald  Procedure(s) Performed: COLONOSCOPY WITH PROPOFOL (N/A ) SUBMUCOSAL LIFTING INJECTION BIOPSY  Patient Location: Endoscopy Unit  Anesthesia Type:MAC  Level of Consciousness: drowsy  Airway & Oxygen Therapy: Patient Spontanous Breathing and Patient connected to nasal cannula oxygen  Post-op Assessment: Report given to RN and Post -op Vital signs reviewed and stable  Post vital signs: Reviewed and stable  Last Vitals:  Vitals Value Taken Time  BP 94/54 01/15/20 1405  Temp    Pulse 90 01/15/20 1405  Resp 22 01/15/20 1405  SpO2 99 % 01/15/20 1405  Vitals shown include unvalidated device data.  Last Pain:  Vitals:   01/15/20 1233  TempSrc: Oral  PainSc: 0-No pain         Complications: No apparent anesthesia complications

## 2020-01-15 NOTE — Anesthesia Preprocedure Evaluation (Addendum)
Anesthesia Evaluation  Patient identified by MRN, date of birth, ID band Patient awake    Reviewed: Allergy & Precautions, H&P , NPO status , Patient's Chart, lab work & pertinent test results  Airway Mallampati: II  TM Distance: >3 FB Neck ROM: Full    Dental no notable dental hx. (+) Partial Upper, Dental Advisory Given   Pulmonary asthma , former smoker,    Pulmonary exam normal breath sounds clear to auscultation       Cardiovascular negative cardio ROS   Rhythm:Regular Rate:Normal     Neuro/Psych negative neurological ROS  negative psych ROS   GI/Hepatic Neg liver ROS, GERD  ,  Endo/Other  negative endocrine ROS  Renal/GU negative Renal ROS  negative genitourinary   Musculoskeletal   Abdominal   Peds  Hematology negative hematology ROS (+)   Anesthesia Other Findings   Reproductive/Obstetrics negative OB ROS                            Anesthesia Physical Anesthesia Plan  ASA: II  Anesthesia Plan: MAC   Post-op Pain Management:    Induction: Intravenous  PONV Risk Score and Plan: 2 and Propofol infusion and Treatment may vary due to age or medical condition  Airway Management Planned: Simple Face Mask and Natural Airway  Additional Equipment:   Intra-op Plan:   Post-operative Plan:   Informed Consent: I have reviewed the patients History and Physical, chart, labs and discussed the procedure including the risks, benefits and alternatives for the proposed anesthesia with the patient or authorized representative who has indicated his/her understanding and acceptance.     Dental advisory given  Plan Discussed with: CRNA  Anesthesia Plan Comments:         Anesthesia Quick Evaluation

## 2020-01-15 NOTE — Discharge Instructions (Signed)
YOU HAD AN ENDOSCOPIC PROCEDURE TODAY: Refer to the procedure report and other information in the discharge instructions given to you for any specific questions about what was found during the examination. If this information does not answer your questions, please call [facility] office at [phone] to clarify.  ° °YOU SHOULD EXPECT: Some feelings of bloating in the abdomen. Passage of more gas than usual. Walking can help get rid of the air that was put into your GI tract during the procedure and reduce the bloating. If you had a lower endoscopy (such as a colonoscopy or flexible sigmoidoscopy) you may notice spotting of blood in your stool or on the toilet paper. Some abdominal soreness may be present for a day or two, also. ° °DIET: Your first meal following the procedure should be a light meal and then it is ok to progress to your normal diet. A half-sandwich or bowl of soup is an example of a good first meal. Heavy or fried foods are harder to digest and may make you feel nauseous or bloated. Drink plenty of fluids but you should avoid alcoholic beverages for 24 hours. If you had a esophageal dilation, please see attached instructions for diet.   ° °ACTIVITY: Your care partner should take you home directly after the procedure. You should plan to take it easy, moving slowly for the rest of the day. You can resume normal activity the day after the procedure however YOU SHOULD NOT DRIVE, use power tools, machinery or perform tasks that involve climbing or major physical exertion for 24 hours (because of the sedation medicines used during the test).  ° °SYMPTOMS TO REPORT IMMEDIATELY: °A gastroenterologist can be reached at any hour. Please call [phone]  for any of the following symptoms:  °Following lower endoscopy (colonoscopy, flexible sigmoidoscopy) °Excessive amounts of blood in the stool  °Significant tenderness, worsening of abdominal pains  °Swelling of the abdomen that is new, acute  °Fever of 100° or  higher  °Following upper endoscopy (EGD, EUS, ERCP, esophageal dilation) °Vomiting of blood or coffee ground material  °New, significant abdominal pain  °New, significant chest pain or pain under the shoulder blades  °Painful or persistently difficult swallowing  °New shortness of breath  °Black, tarry-looking or red, bloody stools ° °FOLLOW UP:  °If any biopsies were taken you will be contacted by phone or by letter within the next 1-3 weeks. Call [phone]  if you have not heard about the biopsies in 3 weeks.  °Please also call with any specific questions about appointments or follow up tests. ° °

## 2020-01-15 NOTE — Anesthesia Procedure Notes (Signed)
Procedure Name: MAC Date/Time: 01/15/2020 1:05 PM Performed by: Imagene Riches, CRNA Pre-anesthesia Checklist: Patient identified, Emergency Drugs available, Suction available, Patient being monitored and Timeout performed Patient Re-evaluated:Patient Re-evaluated prior to induction Oxygen Delivery Method: Simple face mask

## 2020-01-15 NOTE — Op Note (Signed)
[facility] Patient Name: Laura Mcdonald Procedure Date : 01/15/2020 MRN: [id number] Attending MD: Justice Britain , MD Date of Birth: [birth year] CSN: [id number] Age: 64 Admit Type: Outpatient Procedure:                Colonoscopy Indications:              Excision of colonic polyp Providers:                Justice Britain, MD, Burtis Junes, RN, Laverda Sorenson, Technician Referring MD:             Mauri Pole, MD, Rayford Halsted Isaac Bliss Medicines:                Monitored Anesthesia Care Complications:            No immediate complications. Estimated Blood Loss:     Estimated blood loss was minimal. Procedure:                Pre-Anesthesia Assessment:                           - Prior to the procedure, a History and Physical                            was performed, and patient medications and                            allergies were reviewed. The patient's tolerance of                            previous anesthesia was also reviewed. The risks                            and benefits of the procedure and the sedation                            options and risks were discussed with the patient.                            All questions were answered, and informed consent                            was obtained. Prior Anticoagulants: The patient has                            taken no previous anticoagulant or antiplatelet                            agents. ASA Grade Assessment: II - A patient with                            mild systemic disease. After reviewing the risks  and benefits, the patient was deemed in                            satisfactory condition to undergo the procedure.                           After obtaining informed consent, the colonoscope                            was passed under direct vision. Throughout the                            procedure, the patient's blood pressure, pulse, and                             oxygen saturations were monitored continuously. The                            [id number]-H190DL ([id number] Olympus pediatric colonoscope                            was introduced through the anus and advanced to the                            5 cm into the ileum. The CF-[id number] ([id number])                            Olympus colonoscope was introduced through the anus                            and advanced to the the sigmoid colon for                            evaluation. This was the intended extent. The                            colonoscopy was technically difficult and complex.                            Successful completion of the procedure was aided by                            performing the maneuvers documented (below) in this                            report. The patient tolerated the procedure. The                            quality of the bowel preparation was good. The                            terminal ileum, ileocecal valve, appendiceal  orifice, and rectum were photographed. Scope In: 1:05:13 PM Scope Out: 1:56:21 PM Scope Withdrawal Time: 0 hours 46 minutes 24 seconds  Total Procedure Duration: 0 hours 51 minutes 8 seconds  Findings:      The digital rectal exam findings include palpable rectal nodularity/mass       at the very extent of my digit as well as hemorrhoids.      The in the ileum appeared normal.      Three sessile polyps were found in the transverse colon (1) and       ascending colon (2). The polyps were 3 to 6 mm in size. These polyps       were removed with a cold snare. Resection and retrieval were complete.      A 25 mm polypoid lesion was found in the mid rectum (9-11 cm). There was       tattoo that was seen in the rectum and under the lesion. The lesion was       infiltrative and ulcerated. There was loss of all Pit-Pattern in the       middle of the lesion as well as altered vascularity. It was hard to        palpation in the middle of the lesion. Attempt at Underwater EMR,       allowed some of the lesion to float but did not allow for complete       enclosure of the snare around the polyp (it kept flailing off). I then       attempted a typical EMR lift and the area was successfully injected with       10 mL Orise gel for lesion assessment, but the lesion could not be       lifted adequately. I could not ensnare the polyp. I transitioned to the       Adult Colonoscope and placed a PROVE-IT OVESCO FTRD cap and went to the       lesion. I tried to grasp it in multiple areas, but the lesion did not       want to come completely into the FTRD cap and it kept ripping tissue       from the lesion. Thus an attempt at resection via OVESCO was not       performed. I have a high concern for underlying invasiveness. I       proceeded with obtaining biopsies with a cold forceps for histology.      Normal mucosa was found in the entire colon otherwise.      Non-bleeding non-thrombosed internal hemorrhoids were found during       retroflexion, during perianal exam and during digital exam. The       hemorrhoids were Grade II (internal hemorrhoids that prolapse but reduce       spontaneously). Impression:               - Palpable rectal mass/nodularity at distal tip of                            digit and hemorrhoids found on digital rectal exam.                           - The examined portion of the ileum was normal.                           -  Three 3 to 6 mm polyps in the transverse colon                            and in the ascending colon, removed with a cold                            snare. Resected and retrieved.                           - Rule out malignancy, polypoid lesion in the mid                            rectum (9-11 cm). Attempts at resection noted above                            were not successful. Biopsied.                           - Normal mucosa in the entire examined colon                             otherwise.                           - Non-bleeding non-thrombosed internal hemorrhoids. Recommendation:           - The patient will be observed post-procedure,                            until all discharge criteria are met.                           - Discharge patient to home.                           - Patient has a contact number available for                            emergencies. The signs and symptoms of potential                            delayed complications were discussed with the                            patient. Return to normal activities tomorrow.                            Written discharge instructions were provided to the                            patient.                           - High fiber diet.                           -  Continue present medications.                           - Await pathology results - Rushed today.                           - Repeat colonoscopy for surveillance based on                            pathology results.                           - If pathology is confirmatory of malignancy                            (cancer) then I will recommend and order a                            CT-Chest/Abdomen/Pelvis as well as a Pelvic MRI for                            further staging as well as have a CEA level                            obtained. She will need Oncology/Colorectal Surgery                            referrals.                           - If pathology is not-confirmatory of malignancy                            (cancer) but rather just showing HGD changes or                            precancer, I do not believe that I can offer her an                            endoscopic resection technique that would be                            curative. She will need a CT-Abdomen/Pelvis at a                            minimum and possibly a Pelvic MRI. She will need a                            Colorectal Surgery  referral for TAMIS/TENS vs                            consideration of [facility] ESD attempt.                           -  I think the patient is in excellent, health, so                            if this is malignant true surgical options should                            be entertained for curative intent if possible.                           - The findings and recommendations were discussed                            with the patient.                           - The findings and recommendations were discussed                            with the patient's family. Procedure Code(s):        --- Professional ---                           [phone], 52, Colonoscopy, flexible; with removal of                            tumor(s), polyp(s), or other lesion(s) by snare                            technique                           [id number], 52, Colonoscopy, flexible; with directed                            submucosal injection(s), any substance Diagnosis Code(s):        --- Professional ---                           K62.89, Other specified diseases of anus and rectum                           K64.1, Second degree hemorrhoids                           K63.5, Polyp of colon                           D49.0, Neoplasm of unspecified behavior of                            digestive system CPT copyright 2019 [employer]. All rights reserved. The codes documented in this report are preliminary and upon coder review may  be revised to meet current compliance requirements. Justice Britain, MD 01/15/2020 2:34:45 PM Number of Addenda: 0

## 2020-01-16 ENCOUNTER — Other Ambulatory Visit: Payer: Self-pay

## 2020-01-16 ENCOUNTER — Encounter: Payer: Self-pay | Admitting: Gastroenterology

## 2020-01-16 DIAGNOSIS — D128 Benign neoplasm of rectum: Secondary | ICD-10-CM

## 2020-01-16 LAB — SURGICAL PATHOLOGY

## 2020-01-17 ENCOUNTER — Telehealth: Payer: Self-pay | Admitting: Nurse Practitioner

## 2020-01-17 ENCOUNTER — Other Ambulatory Visit: Payer: Medicare Other

## 2020-01-17 DIAGNOSIS — D128 Benign neoplasm of rectum: Secondary | ICD-10-CM

## 2020-01-17 NOTE — Telephone Encounter (Signed)
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 Laura Mcdonald has been cld and scheduled to see [name] 6/14 at 945am. Pt aware to arrive 15 minutes.

## 2020-01-18 LAB — CEA: CEA: <0.5 ng/mL

## 2020-01-18 NOTE — Progress Notes (Signed)
Spoke with patient regarding referral from Dr. Rush Landmark and abnormal findings.  She is aware of her upcoming appointment with medical oncology on Monday 6/14 at 9:45.  She knows our location.  I explained my role as GI nurse navigator.  She verbalized an understanding.

## 2020-01-19 ENCOUNTER — Telehealth: Payer: Self-pay | Admitting: Hematology

## 2020-01-19 NOTE — Telephone Encounter (Signed)
Created in error

## 2020-01-19 NOTE — Telephone Encounter (Signed)
Laura Mcdonald cld and lft a vm to reschedule her new pt appt. I rescheduled Laura Mcdonald to see Dr. Burr Medico on 6/15 at 8am. I lft the appt date and time on the pt's voicemail.

## 2020-01-22 ENCOUNTER — Ambulatory Visit: Payer: Medicare Other | Admitting: Nurse Practitioner

## 2020-01-22 DIAGNOSIS — C20 Malignant neoplasm of rectum: Secondary | ICD-10-CM | POA: Insufficient documentation

## 2020-01-22 NOTE — Progress Notes (Signed)
[place]   Telephone:(336) [phone] Fax:([phone]) [place] Note   Patient Care Team: Truitt Merle, MD as PCP - General (Hematology) Jonnie Finner, RN as Oncology Nurse Navigator Alla Feeling, NP as Nurse Practitioner (Nurse Practitioner)  Date of Service:  01/23/2020   CHIEF COMPLAINTS/PURPOSE OF CONSULTATION:  Newly Diagnosed Rectal Cancer   REFERRING PHYSICIAN:  Dr Rush Landmark  Oncology History Overview Note  Cancer Staging No matching staging information was found for the patient.    Rectal cancer ([place])  12/13/2019 Procedure   Colonosopy by Dr Silverio Decamp 12/13/19  IMPRESSION - Four 5 to 11 mm polyps in the rectum, in the sigmoid colon and at the hepatic flexure, removed with a cold snare. Resected and retrieved. - Three 1 to 2 mm polyps in the transverse colon and in the ascending colon, removed with a cold biopsy forceps. Resected and retrieved. - Rule out malignancy, polypoid lesion in the rectum. Biopsied. Tattooed. - Diverticulosis in the sigmoid colon. - Non-bleeding internal hemorrhoids.  Diagnosis 1. Colon, polyp(s), transverse, hepatic flexure and ascending, polyp (4) - TUBULAR ADENOMA(S) WITHOUT HIGH-GRADE DYSPLASIA OR MALIGNANCY - SESSILE SERRATED ADENOMA(S) WITHOUT CYTOLOGIC DYSPLASIA 2. Colon, polyp(s), sigmoid, rectum, polyps (3) - HYPERPLASTIC POLYP(S) 3. Rectum, biopsy, polyp - TUBULAR ADENOMA(S) WITH HIGH-GRADE DYSPLASIA. SEE NOTE Diagnosis Note 3. Evidence of invasive carcinoma is not seen in the submitted biopsies but a deeper, more severe process cannot be ruled out.   01/15/2020 Procedure   Colonoscopy by Dr Rush Landmark 01/15/20  IMPRESSION - Palpable rectal mass/nodularity at distal tip of digit and hemorrhoids found on digital rectal exam. - The examined portion of the ileum was normal. - Three 3 to 6 mm polyps in the transverse colon and in the ascending colon, removed with a cold snare. Resected and  retrieved. - Rule out malignancy, polypoid lesion in the mid rectum (9-11 cm). Attempts at resection noted above were not successful. Biopsied. - Normal mucosa in the entire examined colon otherwise. - Non-bleeding non-thrombosed internal hemorrhoids.   01/15/2020 Initial Biopsy   FINAL MICROSCOPIC DIAGNOSIS: 01/15/20  A. COLON, ASCENDING AND TRANSVERSE, POLYPECTOMY:  - Sessile serrated polyp(s) without cytologic dysplasia.  - Diminutive inflammatory polyp.  - Detached fragment of unremarkable stroma.   B. RECTUM, MASS, BIOPSY:  - At least intramucosal adenocarcinoma arising in a background of high  grade dysplasia.   COMMENT:   B.  The depth of invasion cannot be evaluated due to the superficial  nature of the biopsy.  Intradepartmental consultation (Dr. Vic Ripper).       01/22/2020 Initial Diagnosis   Rectal cancer ([facility])      HISTORY OF PRESENTING ILLNESS:  Laura Mcdonald 64 y.o. female is a here because of newly diagnosed rectal cancer. The patient was referred by Dr Rush Landmark. The patient presents to the clinic today accompanied by her daughter.  Her rectal mass was found by screening colonoscopy. This was her first colonoscopy. She denies rectal bleeding or pain before or around this time. She notes abdominal discomfort after certain foods which resolved with tums. Dr Rush Landmark tried to resect the tumor by another colonoscopy but was only able to biopsy so he referred her to me. Her path showed invasive adenocarcinoma. She is set to have staging scans and consult with Dr Marcello Moores about surgical resection.  She notes no current chest pain, abdominal issues, or change in BM. Her breathing issues are baseline. She notes after anesthesia tube use during her most recent colonoscopy  she had mouth and throat pain. She notes this has finally started to resolve.   Socially she is married with 5 adult children. She does not drink. She smoked 1 pack per week from [age] years old and quit  in 1985. 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  They have a PMHx of asthma and year around allergies. She uses rescue inhaler. Her breathing is exacerbates by weight gain. She denies recent GERD in the past 5-6 years. She had cervical cancer by whip surgery. She has also had Tubal pregnancy that lead to removal and hand surgery in the past. She notes her father had lung cancer and was a heavy smoker. Her brother had prostate cancer in his 73s. She notes her sister and daughter had colon polyps removed in the past and her sister also had perforation which required temporary colostomy bag.     REVIEW OF SYSTEMS:    Constitutional: Denies fevers, chills or abnormal night sweats Eyes: Denies blurriness of vision, double vision or watery eyes Ears, nose, mouth, throat, and face: Denies mucositis or sore throat Respiratory: Denies cough, dyspnea (+) wheezes Cardiovascular: Denies palpitation, chest discomfort or lower extremity swelling Gastrointestinal:  Denies nausea, heartburn or change in bowel habits Skin: Denies abnormal skin rashes Lymphatics: Denies new lymphadenopathy or easy bruising Neurological:Denies numbness, tingling or new weaknesses Behavioral/Psych: Mood is stable, no new changes  All other systems were reviewed with the patient and are negative.  MEDICAL HISTORY:  Past Medical History:  Diagnosis Date  . Allergy   . GERD (gastroesophageal reflux disease)    past hx but better wirg weight loss   . Mild intermittent asthma     SURGICAL HISTORY: Past Surgical History:  Procedure Laterality Date  . BIOPSY  01/15/2020   Procedure: BIOPSY;  Surgeon: Rush Landmark Telford Nab., MD;  Location: [facility];  Service: Gastroenterology;;  . cervical cancer surgery  1995~   Dr Stann Mainland   . COLONOSCOPY WITH PROPOFOL N/A 01/15/2020   Procedure: COLONOSCOPY WITH PROPOFOL;  Surgeon: Rush Landmark Telford Nab., MD;  Location: [facility];  Service: Gastroenterology;  Laterality:  N/A;  . HAND SURGERY Right   . SUBMUCOSAL LIFTING INJECTION  01/15/2020   Procedure: SUBMUCOSAL LIFTING INJECTION;  Surgeon: Rush Landmark Telford Nab., MD;  Location: [facility];  Service: Gastroenterology;;  . tubal preganancy      SOCIAL HISTORY: Social History   Socioeconomic History  . Marital status: Married    Spouse name: Not on file  . Number of children: 5  . Years of education: Not on file  . Highest education level: Not on file  Occupational History  . Occupation: health care aid   Tobacco Use  . Smoking status: Former Smoker    Packs/day: 0.25    Years: 20.00    Pack years: 5.00    Quit date: 08/11/1983    Years since quitting: 36.4  . Smokeless tobacco: Never Used  Vaping Use  . Vaping Use: Never used  Substance and Sexual Activity  . Alcohol use: Never  . Drug use: Never  . Sexual activity: Not on file  Other Topics Concern  . Not on file  Social History Narrative  . Not on file   Social Determinants of Health   Financial Resource Strain:   . Difficulty of Paying Living Expenses:   Food Insecurity:   . Worried About Charity fundraiser in the Last Year:   . [place] in the Last Year:  Transportation Needs:   . Film/video editor (Medical):   Marland Kitchen Lack of Transportation (Non-Medical):   Physical Activity:   . Days of Exercise per Week:   . Minutes of Exercise per Session:   Stress:   . Feeling of Stress :   Social Connections:   . Frequency of Communication with Friends and Family:   . Frequency of Social Gatherings with Friends and Family:   . Attends Religious Services:   . Active Member of Clubs or Organizations:   . Attends Archivist Meetings:   Marland Kitchen Marital Status:   Intimate Partner Violence:   . Fear of Current or Ex-Partner:   . Emotionally Abused:   Marland Kitchen Physically Abused:   . Sexually Abused:     FAMILY HISTORY: Family History  Problem Relation Age of Onset  . CVA Mother   . Lung cancer Father   . Colon polyps  Sister   . Cancer Brother 40       Prostate cancer   . Esophageal cancer Daughter   . Colon cancer Neg Hx   . Stomach cancer Neg Hx   . Rectal cancer Neg Hx     ALLERGIES:  has No Known Allergies.  MEDICATIONS:  Current Outpatient Medications  Medication Sig Dispense Refill  . albuterol (VENTOLIN HFA) 108 (90 Base) MCG/ACT inhaler Inhale 2 puffs into the lungs every 6 (six) hours as needed for wheezing or shortness of breath.     . APPLE CIDER VINEGAR PO Take 1 tablet by mouth daily. gummies a day    . BLACK ELDERBERRY PO Take 500 mg by mouth daily.     . cetirizine (ZYRTEC) 10 MG tablet Take 10 mg by mouth daily.    . cholecalciferol (VITAMIN D3) 25 MCG (1000 UNIT) tablet Take 1,000 Units by mouth daily.    . Multiple Vitamin (MULTIVITAMIN ADULT PO) Take by mouth.     No current facility-administered medications for this visit.    PHYSICAL EXAMINATION: ECOG PERFORMANCE STATUS: 0 - Asymptomatic  Vitals:   01/23/20 0810  BP: (!) 156/79  Pulse: 75  Resp: 16  Temp: 97.9 F (36.6 C)  SpO2: 93%   Filed Weights   01/23/20 0810  Weight: 206 lb 9.6 oz (93.7 kg)    GENERAL:alert, no distress and comfortable SKIN: skin color, texture, turgor are normal, no rashes or significant lesions EYES: normal, Conjunctiva are pink and non-injected, sclera clear  NECK: supple, thyroid normal size, non-tender, without nodularity LYMPH:  no palpable lymphadenopathy in the cervical, axillary  LUNGS: clear to auscultation and percussion (+) B/l wheezes HEART: regular rate & rhythm and no murmurs and no lower extremity edema ABDOMEN:abdomen soft, non-tender and normal bowel sounds Musculoskeletal:no cyanosis of digits and no clubbing  NEURO: alert & oriented x 3 with fluent speech, no focal motor/sensory deficits Rectal Exam Deferred today   LABORATORY DATA:  I have reviewed the data as listed CBC Latest Ref Rng & Units 12/28/2019  WBC 4.0 - 10.5 K/uL 9.5  Hemoglobin 12.0 - 15.0 g/dL  14.0  Hematocrit 36 - 46 % 41.4  Platelets 150 - 400 K/uL 311.0    CMP Latest Ref Rng & Units 12/28/2019  Glucose 70 - 99 mg/dL 105(H)  BUN 6 - 23 mg/dL 13  Creatinine 0.40 - 1.20 mg/dL 0.81  Sodium 135 - 145 mEq/L 137  Potassium 3.5 - 5.1 mEq/L 3.9  Chloride 96 - 112 mEq/L 108  CO2 19 - 32 mEq/L 24  Calcium 8.4 -  10.5 mg/dL 10.3     RADIOGRAPHIC STUDIES: I have personally reviewed the radiological images as listed and agreed with the findings in the report. US BREAST LTD UNI RIGHT INC AXILLA  Result Date: 01/11/2020 CLINICAL DATA:  Patient recalled from screening for asymmetry and possible distortion within the upper-outer right breast on the MLO view. EXAM: DIGITAL DIAGNOSTIC RIGHT MAMMOGRAM WITH CAD AND TOMO ULTRASOUND RIGHT BREAST COMPARISON:  Previous exam(s). ACR Breast Density Category b: There are scattered areas of fibroglandular density. FINDINGS: Questioned distortion resolved with additional imaging. There is a persistent asymmetry within the upper-outer right breast which partially effaces with additional imaging suggestive of dense fibroglandular tissue. Mammographic images were processed with CAD. On physical exam, no discrete mass is palpated upper-outer right breast. Targeted ultrasound is performed, showing normal tissue without suspicious mass upper outer right breast. IMPRESSION: No mammographic evidence for malignancy. RECOMMENDATION: Screening mammogram in one year.(Code:SM-B-01Y) I have discussed the findings and recommendations with the patient. If applicable, a reminder letter will be sent to the patient regarding the next appointment. BI-RADS CATEGORY  1: Negative. Electronically Signed   By: Lovey Newcomer M.D.   On: 01/11/2020 15:14   MM DIAG BREAST TOMO UNI RIGHT  Result Date: 01/11/2020 CLINICAL DATA:  Patient recalled from screening for asymmetry and possible distortion within the upper-outer right breast on the MLO view. EXAM: DIGITAL DIAGNOSTIC RIGHT MAMMOGRAM WITH  CAD AND TOMO ULTRASOUND RIGHT BREAST COMPARISON:  Previous exam(s). ACR Breast Density Category b: There are scattered areas of fibroglandular density. FINDINGS: Questioned distortion resolved with additional imaging. There is a persistent asymmetry within the upper-outer right breast which partially effaces with additional imaging suggestive of dense fibroglandular tissue. Mammographic images were processed with CAD. On physical exam, no discrete mass is palpated upper-outer right breast. Targeted ultrasound is performed, showing normal tissue without suspicious mass upper outer right breast. IMPRESSION: No mammographic evidence for malignancy. RECOMMENDATION: Screening mammogram in one year.(Code:SM-B-01Y) I have discussed the findings and recommendations with the patient. If applicable, a reminder letter will be sent to the patient regarding the next appointment. BI-RADS CATEGORY  1: Negative. Electronically Signed   By: Lovey Newcomer M.D.   On: 01/11/2020 15:14    ASSESSMENT & PLAN:  Laura Mcdonald is a 64 y.o. African American female with a history of H/o Cervical Cancer, Asthma    1. Rectal adenocarcinoma, cTxNxMx  -I discussed her colonoscopy and biopsy pathology findings with her and her daughter in great detail. Her 12/13/19 screening colonoscopy with Dr Silverio Decamp showed a large mid rectal polyp highly suspicious for cancer. Dr Rush Landmark attempted endoscopy resection with repeat colonoscopy on 01/15/20 but was unsuccessful and to biopsy was done. Pathology shows invasive intramucosal adenocarcinoma. Based on exam this appears to be early stage. She will proceed with CT CAP and MRI pelvis to complete staging.  -I discussed because tumor was not able to be removed by colonoscopy she will need surgery to remove this. She may proceed with this first if confirmed to be early stage disease.  -If staging scans show more locally advanced II-III cancer (T3 or above or positive node), then I would recommend  neoadjuvant chemo and radiation to down stage her cancer before surgery. I also discussed there is a small chance her cancer could be cured with chemo and RT alone if she does not do surgery.  -I discussed if she is found to have metastasis which is stage IV cancer or she does not proceed with surgery, her  cancer is then likely not curable, but still treatable. I suspect the chance of metastatic disease is very low giving the clinical presentation  -I will discuss her case in GI tumor Board with staging scans to finalize treatment options.  -She plans to consult with Dr Marcello Moores about surgery soon.  -Her baseline CEA was normal. Her labs from last month were normal. Physical exam today unremarkable.  -I will call her with scan results. F/u open  2. H/o Cervical Cancer, Genetic Testing  -She had Cervical cancer which only required Whip Surgery around 1995.  -She has family history of prostate cancer and polyps removed in her family. I discussed she may be eligible for genetic testing. She is interested, will send referral.  -I encouraged her to have her children have start screening colonoscopies by age 34.   3. Asthma  -She has asthma and year round allergies. She uses rescue inhaler as needed which she used this morning due to recent flare due to being outside. She does not have pulmonologist.  -She has b/l wheezes on exam today (01/23/20).   PLAN:  -Send genetic referral  -CT CAP w contrast tomorrow, will call her with results  -MRI Pelvis on 6/23, she will see Dr. Marcello Moores after MRI  -F/u open    Orders Placed This Encounter  Procedures  . Ambulatory referral to Genetics    Referral Priority:   Routine    Referral Type:   Consultation    Referral Reason:   Specialty Services Required    Number of Visits Requested:   1    All questions were answered. The patient knows to call the clinic with any problems, questions or concerns. The total time spent in the appointment was 45 minutes.      Truitt Merle, MD 01/23/2020 8:58 AM  I, Joslyn Devon, am acting as scribe for Truitt Merle, MD.   I have reviewed the above documentation for accuracy and completeness, and I agree with the above.

## 2020-01-23 ENCOUNTER — Encounter: Payer: Self-pay | Admitting: Hematology

## 2020-01-23 ENCOUNTER — Other Ambulatory Visit: Payer: Self-pay

## 2020-01-23 ENCOUNTER — Inpatient Hospital Stay: Payer: Medicare Other | Attending: Nurse Practitioner | Admitting: Hematology

## 2020-01-23 DIAGNOSIS — C20 Malignant neoplasm of rectum: Secondary | ICD-10-CM | POA: Insufficient documentation

## 2020-01-23 DIAGNOSIS — Z87891 Personal history of nicotine dependence: Secondary | ICD-10-CM | POA: Diagnosis not present

## 2020-01-23 DIAGNOSIS — K648 Other hemorrhoids: Secondary | ICD-10-CM | POA: Diagnosis not present

## 2020-01-23 DIAGNOSIS — D125 Benign neoplasm of sigmoid colon: Secondary | ICD-10-CM

## 2020-01-23 DIAGNOSIS — Z808 Family history of malignant neoplasm of other organs or systems: Secondary | ICD-10-CM | POA: Diagnosis not present

## 2020-01-23 DIAGNOSIS — Z801 Family history of malignant neoplasm of trachea, bronchus and lung: Secondary | ICD-10-CM | POA: Diagnosis not present

## 2020-01-23 DIAGNOSIS — J4520 Mild intermittent asthma, uncomplicated: Secondary | ICD-10-CM | POA: Diagnosis not present

## 2020-01-23 DIAGNOSIS — D122 Benign neoplasm of ascending colon: Secondary | ICD-10-CM | POA: Diagnosis not present

## 2020-01-23 DIAGNOSIS — Z8249 Family history of ischemic heart disease and other diseases of the circulatory system: Secondary | ICD-10-CM | POA: Diagnosis not present

## 2020-01-23 DIAGNOSIS — Z8541 Personal history of malignant neoplasm of cervix uteri: Secondary | ICD-10-CM | POA: Insufficient documentation

## 2020-01-23 DIAGNOSIS — Z8042 Family history of malignant neoplasm of prostate: Secondary | ICD-10-CM | POA: Diagnosis not present

## 2020-01-23 DIAGNOSIS — D123 Benign neoplasm of transverse colon: Secondary | ICD-10-CM | POA: Diagnosis not present

## 2020-01-23 DIAGNOSIS — K5730 Diverticulosis of large intestine without perforation or abscess without bleeding: Secondary | ICD-10-CM | POA: Diagnosis not present

## 2020-01-23 DIAGNOSIS — N6489 Other specified disorders of breast: Secondary | ICD-10-CM | POA: Insufficient documentation

## 2020-01-23 DIAGNOSIS — Z79899 Other long term (current) drug therapy: Secondary | ICD-10-CM | POA: Insufficient documentation

## 2020-01-23 NOTE — Progress Notes (Signed)
Met with patient and her daughter today at initial medical oncology appointment with Dr. Burr Medico.  She understands my role as nurse navigator and they both were given my card with my direct contact information.  She understands the plain to obtain a CT and MRI and has an appointment to see Dr. Marcello Moores on 6/29.  We will follow up after that to determine next steps.

## 2020-01-24 ENCOUNTER — Ambulatory Visit (HOSPITAL_COMMUNITY)
Admission: RE | Admit: 2020-01-24 | Discharge: 2020-01-24 | Disposition: A | Discharge status: Home / Self Care | Payer: Medicare Other | Source: Ambulatory Visit | Attending: Gastroenterology | Admitting: Gastroenterology

## 2020-01-24 ENCOUNTER — Telehealth: Payer: Self-pay | Admitting: Hematology

## 2020-01-24 DIAGNOSIS — D128 Benign neoplasm of rectum: Secondary | ICD-10-CM | POA: Diagnosis not present

## 2020-01-24 DIAGNOSIS — D179 Benign lipomatous neoplasm, unspecified: Secondary | ICD-10-CM | POA: Diagnosis not present

## 2020-01-24 DIAGNOSIS — R918 Other nonspecific abnormal finding of lung field: Secondary | ICD-10-CM | POA: Diagnosis not present

## 2020-01-24 DIAGNOSIS — D7389 Other diseases of spleen: Secondary | ICD-10-CM | POA: Diagnosis not present

## 2020-01-24 DIAGNOSIS — N200 Calculus of kidney: Secondary | ICD-10-CM | POA: Insufficient documentation

## 2020-01-24 MED ORDER — SODIUM CHLORIDE (PF) 0.9 % IJ SOLN
INTRAMUSCULAR | Status: AC
  Filled 2020-01-24: qty 50

## 2020-01-24 MED ORDER — IOHEXOL 300 MG/ML  SOLN
100.0000 mL | Freq: Once | INTRAMUSCULAR | Status: AC | PRN
  Administered 2020-01-24: 100 mL via INTRAVENOUS

## 2020-01-24 NOTE — Telephone Encounter (Signed)
Scheduled appt per 6/15 sch message - unable to reach pt - mailed reminder letter with appt date and time

## 2020-01-31 ENCOUNTER — Ambulatory Visit (HOSPITAL_COMMUNITY)
Admission: RE | Admit: 2020-01-31 | Discharge: 2020-01-31 | Disposition: A | Discharge status: Home / Self Care | Payer: Medicare Other | Source: Ambulatory Visit | Attending: Gastroenterology | Admitting: Gastroenterology

## 2020-01-31 ENCOUNTER — Other Ambulatory Visit: Payer: Self-pay

## 2020-01-31 DIAGNOSIS — D128 Benign neoplasm of rectum: Secondary | ICD-10-CM | POA: Diagnosis not present

## 2020-01-31 MED ORDER — GADOBUTROL 1 MMOL/ML IV SOLN
9.0000 mL | Freq: Once | INTRAVENOUS | Status: AC | PRN
  Administered 2020-01-31: 9 mL via INTRAVENOUS

## 2020-02-06 ENCOUNTER — Ambulatory Visit: Payer: Self-pay | Admitting: General Surgery

## 2020-02-06 NOTE — H&P (Signed)
The patient is a 64 year old female who presents with colorectal cancer. 64 year old female who is here today for a newly diagnosed rectal cancer. This was diagnosed with screening colonoscopy. An endoscopic resection was attempted but was unable to lift. Biopsy shows tubular adenoma with high-grade dysplasia and invasive carcinoma could not be ruled out. The area is tattooed. CT scans of the chest, abdomen and pelvis show a small nodule in the right lung, which is thought to be inflammatory. There is a small lesion in the spleen which may require MRI or PET CT in the future. MRI shows a T1/T2 mass in the mid rectum approximately 6.8 cm from the internal anal sphincter. There is no lymph node involvement noted. Patient has a history of 2 ectopic pregnancies and is status post resection of one ovary and tube.   Past Surgical History Sabino Gasser, [place]; 02/06/2020 9:30 AM) Colon Polyp Removal - Colonoscopy  Diagnostic Studies History Sabino Gasser, CMA; 02/06/2020 9:30 AM) Colonoscopy within last year Mammogram within last year Pap Smear 1-5 years ago  Allergies Sabino Gasser, [place]; 02/06/2020 9:31 AM) No Known Drug Allergies [02/06/2020]: Allergies Reconciled  Medication History Sabino Gasser, CMA; 02/06/2020 9:31 AM) Cetirizine HCl (10MG  Tablet, Oral) Active. Medications Reconciled  Social History Sabino Gasser, CMA; 02/06/2020 9:30 AM) Caffeine use Coffee, Tea. No alcohol use No drug use Tobacco use Former smoker.  Family History Sabino Gasser, [place]; 02/06/2020 9:30 AM) Alcohol Abuse Brother, Father, Sister. Arthritis Mother. Cerebrovascular Accident Mother. Diabetes Mellitus Family Members In General, Sister. Hypertension Mother, Sister. Prostate Cancer Brother, Father. Respiratory Condition Father. Thyroid problems Daughter.  Pregnancy / Birth History Sabino Gasser, [place]; 02/06/2020 9:30 AM) Age at menarche 48 years. Age of menopause <43 Gravida  5 Length (months) of breastfeeding 12-24 Maternal age 59-20 Para 5 Regular periods  Other Problems Sabino Gasser, [place]; 02/06/2020 9:30 AM) Cervical Cancer     Review of Systems Sabino Gasser CMA; 02/06/2020 9:30 AM) General Present- Night Sweats. Not Present- Appetite Loss, Chills, Fatigue, Fever, Weight Gain and Weight Loss. Skin Not Present- Change in Wart/Mole, Dryness, Hives, Jaundice, New Lesions, Non-Healing Wounds, Rash and Ulcer. HEENT Present- Sinus Pain and Wears glasses/contact lenses. Not Present- Earache, Hearing Loss, Hoarseness, Nose Bleed, Oral Ulcers, Ringing in the Ears, Seasonal Allergies, Sore Throat, Visual Disturbances and Yellow Eyes. Respiratory Present- Snoring. Not Present- Bloody sputum, Chronic Cough, Difficulty Breathing and Wheezing. Breast Not Present- Breast Mass, Breast Pain, Nipple Discharge and Skin Changes. Cardiovascular Not Present- Chest Pain, Difficulty Breathing Lying Down, Leg Cramps, Palpitations, Rapid Heart Rate, Shortness of Breath and Swelling of Extremities. Gastrointestinal Not Present- Abdominal Pain, Bloating, Bloody Stool, Change in Bowel Habits, Chronic diarrhea, Constipation, Difficulty Swallowing, Excessive gas, Gets full quickly at meals, Hemorrhoids, Indigestion, Nausea, Rectal Pain and Vomiting. Female Genitourinary Not Present- Frequency, Nocturia, Painful Urination, Pelvic Pain and Urgency. Musculoskeletal Not Present- Back Pain, Joint Pain, Joint Stiffness, Muscle Pain, Muscle Weakness and Swelling of Extremities. Neurological Not Present- Decreased Memory, Fainting, Headaches, Numbness, Seizures, Tingling, Tremor, Trouble walking and Weakness. Psychiatric Not Present- Anxiety, Bipolar, Change in Sleep Pattern, Depression, Fearful and Frequent crying. Endocrine Present- Hot flashes. Not Present- Cold Intolerance, Excessive Hunger, Hair Changes, Heat Intolerance and New Diabetes. Hematology Not Present- Blood Thinners, Easy  Bruising, Excessive bleeding, Gland problems, HIV and Persistent Infections.  Vitals Sabino Gasser CMA; 02/06/2020 9:32 AM) 02/06/2020 9:31 AM Weight: 208.2 lb Height: 65in Body Surface Area: 2.01 m Body Mass Index: 34.65 kg/m  Temp.: 98.3F(Tympanic)  Pulse: 80 (Regular)  BP: 136/84(Sitting, Left Arm, Standard)        Physical Exam Leighton Ruff MD; 4/54/0981 10:15 AM)  General Mental Status-Alert. General Appearance-Cooperative. CV: RRR Lungs: CTA Abdomen Palpation/Percussion Palpation and Percussion of the abdomen reveal - Soft and Non Tender.    Assessment & Plan Leighton Ruff MD; 1/91/4782 10:11 AM)  RECTAL CANCER ([facility]) Impression: 64 year old female who presents to the office for evaluation of a newly diagnosed rectal cancer. Patient has what appears to be a early stage cancer (T1/T2). MRI shows no signs of lymph node involvement. We discussed surgical resection upfront with probable diverting ileostomy versus chemotherapy and radiation alone as an alternative approach. We discussed the pitfalls of both treatments in detail. We discussed that surgery is the gold standard. Patient is very healthy with a history of pelvic surgery due to ectopic pregnancy. Her biggest concern is avoiding a permanent colostomy. She is okay with a temporary ileostomy. She does have a family member dealing with an ileostomy currently. After reviewing all the options, she has decided to proceed with upfront surgery. She will call the office if she changes her mind. We will try to get her on the surgical schedule as soon as possible. The surgery and anatomy were described to the patient as well as the risks of surgery and the possible complications. These include: Bleeding, deep abdominal infections and possible wound complications such as hernia and infection, damage to adjacent structures, leak of surgical connections, which can lead to other surgeries and possibly an ostomy,  possible need for other procedures, such as abscess drains in radiology, possible prolonged hospital stay, possible diarrhea from removal of part of the colon, possible constipation from narcotics, possible bowel, bladder or sexual dysfunction if having rectal surgery, prolonged fatigue/weakness or appetite loss, possible early recurrence of of disease, possible complications of their medical problems such as heart disease or arrhythmias or lung problems, death (less than 1%). I believe the patient understands and wishes to proceed with the surgery.

## 2020-02-06 NOTE — H&P (View-Only) (Signed)
The patient is a 64 year old female who presents with colorectal cancer. 64 year old female who is here today for a newly diagnosed rectal cancer. This was diagnosed with screening colonoscopy. An endoscopic resection was attempted but was unable to lift. Biopsy shows tubular adenoma with high-grade dysplasia and invasive carcinoma could not be ruled out. The area is tattooed. CT scans of the chest, abdomen and pelvis show a small nodule in the right lung, which is thought to be inflammatory. There is a small lesion in the spleen which may require MRI or PET CT in the future. MRI shows a T1/T2 mass in the mid rectum approximately 6.8 cm from the internal anal sphincter. There is no lymph node involvement noted. Patient has a history of 2 ectopic pregnancies and is status post resection of one ovary and tube.   Past Surgical History Sabino Gasser, [place]; 02/06/2020 9:30 AM) Colon Polyp Removal - Colonoscopy  Diagnostic Studies History Sabino Gasser, CMA; 02/06/2020 9:30 AM) Colonoscopy within last year Mammogram within last year Pap Smear 1-5 years ago  Allergies Sabino Gasser, [place]; 02/06/2020 9:31 AM) No Known Drug Allergies [02/06/2020]: Allergies Reconciled  Medication History Sabino Gasser, CMA; 02/06/2020 9:31 AM) Cetirizine HCl (10MG  Tablet, Oral) Active. Medications Reconciled  Social History Sabino Gasser, CMA; 02/06/2020 9:30 AM) Caffeine use Coffee, Tea. No alcohol use No drug use Tobacco use Former smoker.  Family History Sabino Gasser, [place]; 02/06/2020 9:30 AM) Alcohol Abuse Brother, Father, Sister. Arthritis Mother. Cerebrovascular Accident Mother. Diabetes Mellitus Family Members In General, Sister. Hypertension Mother, Sister. Prostate Cancer Brother, Father. Respiratory Condition Father. Thyroid problems Daughter.  Pregnancy / Birth History Sabino Gasser, [place]; 02/06/2020 9:30 AM) Age at menarche 42 years. Age of menopause <43 Gravida  5 Length (months) of breastfeeding 12-24 Maternal age 57-20 Para 5 Regular periods  Other Problems Sabino Gasser, [facility]; 02/06/2020 9:30 AM) Cervical Cancer     Review of Systems Sabino Gasser CMA; 02/06/2020 9:30 AM) General Present- Night Sweats. Not Present- Appetite Loss, Chills, Fatigue, Fever, Weight Gain and Weight Loss. Skin Not Present- Change in Wart/Mole, Dryness, Hives, Jaundice, New Lesions, Non-Healing Wounds, Rash and Ulcer. HEENT Present- Sinus Pain and Wears glasses/contact lenses. Not Present- Earache, Hearing Loss, Hoarseness, Nose Bleed, Oral Ulcers, Ringing in the Ears, Seasonal Allergies, Sore Throat, Visual Disturbances and Yellow Eyes. Respiratory Present- Snoring. Not Present- Bloody sputum, Chronic Cough, Difficulty Breathing and Wheezing. Breast Not Present- Breast Mass, Breast Pain, Nipple Discharge and Skin Changes. Cardiovascular Not Present- Chest Pain, Difficulty Breathing Lying Down, Leg Cramps, Palpitations, Rapid Heart Rate, Shortness of Breath and Swelling of Extremities. Gastrointestinal Not Present- Abdominal Pain, Bloating, Bloody Stool, Change in Bowel Habits, Chronic diarrhea, Constipation, Difficulty Swallowing, Excessive gas, Gets full quickly at meals, Hemorrhoids, Indigestion, Nausea, Rectal Pain and Vomiting. Female Genitourinary Not Present- Frequency, Nocturia, Painful Urination, Pelvic Pain and Urgency. Musculoskeletal Not Present- Back Pain, Joint Pain, Joint Stiffness, Muscle Pain, Muscle Weakness and Swelling of Extremities. Neurological Not Present- Decreased Memory, Fainting, Headaches, Numbness, Seizures, Tingling, Tremor, Trouble walking and Weakness. Psychiatric Not Present- Anxiety, Bipolar, Change in Sleep Pattern, Depression, Fearful and Frequent crying. Endocrine Present- Hot flashes. Not Present- Cold Intolerance, Excessive Hunger, Hair Changes, Heat Intolerance and New Diabetes. Hematology Not Present- Blood Thinners, Easy  Bruising, Excessive bleeding, Gland problems, HIV and Persistent Infections.  Vitals Sabino Gasser CMA; 02/06/2020 9:32 AM) 02/06/2020 9:31 AM Weight: 208.2 lb Height: 65in Body Surface Area: 2.01 m Body Mass Index: 34.65 kg/m  Temp.: 98.34F(Tympanic)  Pulse: 80 (Regular)  BP: 136/84(Sitting, Left Arm, Standard)        Physical Exam Leighton Ruff MD; 3/78/5885 10:15 AM)  General Mental Status-Alert. General Appearance-Cooperative. CV: RRR Lungs: CTA Abdomen Palpation/Percussion Palpation and Percussion of the abdomen reveal - Soft and Non Tender.    Assessment & Plan Leighton Ruff MD; 0/27/7412 10:11 AM)  RECTAL CANCER (C20) Impression: 65 year old female who presents to the office for evaluation of a newly diagnosed rectal cancer. Patient has what appears to be a early stage cancer (T1/T2). MRI shows no signs of lymph node involvement. We discussed surgical resection upfront with probable diverting ileostomy versus chemotherapy and radiation alone as an alternative approach. We discussed the pitfalls of both treatments in detail. We discussed that surgery is the gold standard. Patient is very healthy with a history of pelvic surgery due to ectopic pregnancy. Her biggest concern is avoiding a permanent colostomy. She is okay with a temporary ileostomy. She does have a family member dealing with an ileostomy currently. After reviewing all the options, she has decided to proceed with upfront surgery. She will call the office if she changes her mind. We will try to get her on the surgical schedule as soon as possible. The surgery and anatomy were described to the patient as well as the risks of surgery and the possible complications. These include: Bleeding, deep abdominal infections and possible wound complications such as hernia and infection, damage to adjacent structures, leak of surgical connections, which can lead to other surgeries and possibly an ostomy,  possible need for other procedures, such as abscess drains in radiology, possible prolonged hospital stay, possible diarrhea from removal of part of the colon, possible constipation from narcotics, possible bowel, bladder or sexual dysfunction if having rectal surgery, prolonged fatigue/weakness or appetite loss, possible early recurrence of of disease, possible complications of their medical problems such as heart disease or arrhythmias or lung problems, death (less than 1%). I believe the patient understands and wishes to proceed with the surgery.

## 2020-02-07 ENCOUNTER — Telehealth: Payer: Self-pay | Admitting: Gastroenterology

## 2020-02-07 ENCOUNTER — Other Ambulatory Visit: Payer: Self-pay

## 2020-02-07 NOTE — Telephone Encounter (Signed)
Dr Rush Landmark the pt is aware that you are out of the office and is not expecting a quick response but would like to speak with you about other options for treatments.  She is concerned and has a lot of questions.

## 2020-02-07 NOTE — Telephone Encounter (Signed)
Patient would like to ask you a question please call her

## 2020-02-08 NOTE — Patient Instructions (Addendum)
DUE TO COVID-19 ONLY ONE VISITOR IS ALLOWED TO COME WITH YOU AND STAY IN THE WAITING ROOM ONLY DURING PRE OP AND PROCEDURE DAY OF SURGERY. TWO  VISITOR MAY VISIT WITH YOU AFTER SURGERY IN YOUR PRIVATE ROOM DURING VISITING HOURS ONLY!  YOU NEED TO HAVE A COVID 19 TEST ON_7-5-21______ @_______ , THIS TEST MUST BE DONE BEFORE SURGERY, COME  [address] [place] , [address].  ([place]) ONCE YOUR COVID TEST IS COMPLETED, PLEASE BEGIN THE QUARANTINE INSTRUCTIONS AS OUTLINED IN YOUR HANDOUT.                Laura Mcdonald  02/08/2020   Your procedure is scheduled on: 02-15-20   Report to [facility] Main  Entrance   Report to admitting at       1205 PM     Call this number if you have problems the morning of surgery [phone]    Remember: Follow bowel prep per surgeons order  Follow a clear liquid diet  The day of your bowel prep to prevent dehydration   DRINK 2 PRESURGERY ENSURE DRINKS THE NIGHT BEFORE SURGERY AT  1000 PM AND 1 PRESURGERY DRINK THE DAY OF THE PROCEDURE 3 HOURS PRIOR TO SCHEDULED SURGERY. NOTHING BY MOUTH EXCEPT CLEAR LIQUIDS UNTIL THREE HOURS PRIOR TO SCHEDULED SURGERY. PLEASE FINISH PRESURGERY ENSURE DRINK PER SURGEON ORDER 3 HOURS PRIOR TO SCHEDULED SURGERY TIME WHICH NEEDS TO BE COMPLETED AT 11:00 am_________.    CLEAR LIQUID DIET   Foods Allowed                                                                           Foods Excluded  Coffee and tea, regular and decaf  NO CREAMER                           liquids that you cannot  Plain Jell-O any favor except red or purple                                           see through such as: Fruit ices (not with fruit pulp)                                                    milk, soups, orange juice  Iced Popsicles                                                  All solid food                                   Cranberry, grape and apple juices Sports drinks like Gatorade Lightly seasoned  clear broth or consume(fat free)  Sugar, honey syrup  Sample Menu Breakfast                                Lunch                                     Supper Cranberry juice                    Beef broth                            Chicken broth Jell-O                                     Grape juice                           Apple juice Coffee or tea                        Jell-O                                      Popsicle                                                Coffee or tea                        Coffee or tea  _____________________________________________________________________    BRUSH YOUR TEETH MORNING OF SURGERY AND RINSE YOUR MOUTH OUT, NO CHEWING GUM CANDY OR MINTS.     Take these medicines the morning of surgery with A SIP OF WATER: inhaler bring with you                                You may not have any metal on your body including hair pins and              piercings  Do not wear jewelry, make-up, lotions, powders or perfumes, deodorant             Do not wear nail polish on your fingernails.  Do not shave  48 hours prior to surgery.               Do not bring valuables to the hospital. [place].  Contacts, dentures or bridgework may not be worn into surgery.      Patients discharged the day of surgery will not be allowed to drive home. IF YOU ARE HAVING SURGERY AND GOING HOME THE SAME DAY, YOU MUST HAVE AN ADULT TO DRIVE YOU HOME AND BE WITH YOU FOR 24 HOURS. YOU MAY GO HOME BY TAXI OR UBER OR ORTHERWISE, BUT AN ADULT MUST ACCOMPANY YOU HOME AND STAY WITH YOU FOR 24 HOURS.  Name and phone number of your driver:  Special Instructions: N/A              Please read over the following fact sheets you were given: _____________________________________________________________________             [facility] - Preparing for Surgery Before surgery, you can play an important role.  Because skin is not sterile, your  skin needs to be as free of germs as possible.  You can reduce the number of germs on your skin by washing with CHG (chlorahexidine gluconate) soap before surgery.  CHG is an antiseptic cleaner which kills germs and bonds with the skin to continue killing germs even after washing. Please DO NOT use if you have an allergy to CHG or antibacterial soaps.  If your skin becomes reddened/irritated stop using the CHG and inform your nurse when you arrive at Short Stay. Do not shave (including legs and underarms) for at least 48 hours prior to the first CHG shower.  You may shave your face/neck. Please follow these instructions carefully:  1.  Shower with CHG Soap the night before surgery and the  morning of Surgery.  2.  If you choose to wash your hair, wash your hair first as usual with your  normal  shampoo.  3.  After you shampoo, rinse your hair and body thoroughly to remove the  shampoo.                           4.  Use CHG as you would any other liquid soap.  You can apply chg directly  to the skin and wash                       Gently with a scrungie or clean washcloth.  5.  Apply the CHG Soap to your body ONLY FROM THE NECK DOWN.   Do not use on face/ open                           Wound or open sores. Avoid contact with eyes, ears mouth and genitals (private parts).                       Wash face,  Genitals (private parts) with your normal soap.             6.  Wash thoroughly, paying special attention to the area where your surgery  will be performed.  7.  Thoroughly rinse your body with warm water from the neck down.  8.  DO NOT shower/wash with your normal soap after using and rinsing off  the CHG Soap.                9.  Pat yourself dry with a clean towel.            10.  Wear clean pajamas.            11.  Place clean sheets on your bed the night of your first shower and do not  sleep with pets. Day of Surgery : Do not apply any lotions/deodorants the morning of surgery.  Please wear  clean clothes to the hospital/surgery center.  FAILURE TO FOLLOW THESE INSTRUCTIONS MAY RESULT IN THE CANCELLATION OF YOUR SURGERY PATIENT SIGNATURE_________________________________  NURSE SIGNATURE__________________________________  ________________________________________________________________________   Laura Mcdonald  An incentive spirometer is a  tool that can help keep your lungs clear and active. This tool measures how well you are filling your lungs with each breath. Taking long deep breaths may help reverse or decrease the chance of developing breathing (pulmonary) problems (especially infection) following:  A long period of time when you are unable to move or be active. BEFORE THE PROCEDURE   If the spirometer includes an indicator to show your best effort, your nurse or respiratory therapist will set it to a desired goal.  If possible, sit up straight or lean slightly forward. Try not to slouch.  Hold the incentive spirometer in an upright position. INSTRUCTIONS FOR USE  1. Sit on the edge of your bed if possible, or sit up as far as you can in bed or on a chair. 2. Hold the incentive spirometer in an upright position. 3. Breathe out normally. 4. Place the mouthpiece in your mouth and seal your lips tightly around it. 5. Breathe in slowly and as deeply as possible, raising the piston or the ball toward the top of the column. 6. Hold your breath for 3-5 seconds or for as long as possible. Allow the piston or ball to fall to the bottom of the column. 7. Remove the mouthpiece from your mouth and breathe out normally. 8. Rest for a few seconds and repeat Steps 1 through 7 at least 10 times every 1-2 hours when you are awake. Take your time and take a few normal breaths between deep breaths. 9. The spirometer may include an indicator to show your best effort. Use the indicator as a goal to work toward during each repetition. 10. After each set of 10 deep breaths, practice  coughing to be sure your lungs are clear. If you have an incision (the cut made at the time of surgery), support your incision when coughing by placing a pillow or rolled up towels firmly against it. Once you are able to get out of bed, walk around indoors and cough well. You may stop using the incentive spirometer when instructed by your caregiver.  RISKS AND COMPLICATIONS  Take your time so you do not get dizzy or light-headed.  If you are in pain, you may need to take or ask for pain medication before doing incentive spirometry. It is harder to take a deep breath if you are having pain. AFTER USE  Rest and breathe slowly and easily.  It can be helpful to keep track of a log of your progress. Your caregiver can provide you with a simple table to help with this. If you are using the spirometer at home, follow these instructions: [place] IF:   You are having difficultly using the spirometer.  You have trouble using the spirometer as often as instructed.  Your pain medication is not giving enough relief while using the spirometer.  You develop fever of 100.5 F (38.1 C) or higher. SEEK IMMEDIATE MEDICAL CARE IF:   You cough up bloody sputum that had not been present before.  You develop fever of 102 F (38.9 C) or greater.  You develop worsening pain at or near the incision site. MAKE SURE YOU:   Understand these instructions.  Will watch your condition.  Will get help right away if you are not doing well or get worse. Document Released: 12/07/2006 Document Revised: 10/19/2011 Document Reviewed: 02/07/2007 [facility] Patient Information 2014 [place], [place].   ________________________________________________________________________

## 2020-02-08 NOTE — Progress Notes (Signed)
PCP - Isaac Bliss, Olam Idler ,MD Cardiologist - NO  PPM/ICD -  Device Orders -  Rep Notified -   Chest x-ray - CT chest 01-24-20 epic EKG -  Stress Test -  ECHO -  Cardiac Cath -   Sleep Study -  CPAP -   Fasting Blood Sugar -  Checks Blood Sugar _____ times a day  Blood Thinner Instructions: Aspirin Instructions:  ERAS Protcol - PRE-SURGERY Ensure or G2-   COVID TEST-   Activity- can climb a flight of stairs without SOB Anesthesia review:   Patient denies shortness of breath, fever, cough and chest pain at PAT appointment  NONE   All instructions explained to the patient, with a verbal understanding of the material. Patient agrees to go over the instructions while at home for a better understanding. Patient also instructed to self quarantine after being tested for COVID-19. The opportunity to ask questions was provided.

## 2020-02-09 ENCOUNTER — Telehealth: Payer: Self-pay | Admitting: Hematology

## 2020-02-09 NOTE — Telephone Encounter (Signed)
Patient called my office today, and wanted to have chemoradiation for her rectal cancer, instead of surgery.  She saw Dr. Marcello Moores earlier this week,. Per pt, she was told that she has option of surgery with temporary colostomy, versus chemoRT.  She did not like colostomy, and decided to take chemoradiation.  I explained to her that her tumor is very early stage (T1/2N0M0), the standard of care is surgery, chemoradiation will be only offered if she absolutely declines surgery.  Patient got upset, and confused about different opinions from me and Dr. Marcello Moores.  She states she will get a second opinion somewhere else, and hung up her phone.    I sent a message to Drs. Thomas and Mansouraty about the above, and will let our navigator Malachy Mood follow up on her next week to see how we can assist her for her second opinion.  Truitt Merle  02/09/2020 5:35 PM

## 2020-02-13 ENCOUNTER — Encounter (HOSPITAL_COMMUNITY)
Admission: RE | Admit: 2020-02-13 | Discharge: 2020-02-13 | Disposition: A | Discharge status: Home / Self Care | Payer: Medicare Other | Source: Ambulatory Visit | Attending: General Surgery | Admitting: General Surgery

## 2020-02-13 ENCOUNTER — Other Ambulatory Visit (HOSPITAL_COMMUNITY)
Admission: RE | Admit: 2020-02-13 | Discharge: 2020-02-13 | Disposition: A | Discharge status: Home / Self Care | Payer: Medicare Other | Source: Ambulatory Visit | Attending: General Surgery | Admitting: General Surgery

## 2020-02-13 ENCOUNTER — Encounter (HOSPITAL_COMMUNITY): Payer: Self-pay

## 2020-02-13 ENCOUNTER — Other Ambulatory Visit: Payer: Self-pay

## 2020-02-13 DIAGNOSIS — Z01812 Encounter for preprocedural laboratory examination: Secondary | ICD-10-CM | POA: Insufficient documentation

## 2020-02-13 DIAGNOSIS — Z20822 Contact with and (suspected) exposure to covid-19: Secondary | ICD-10-CM | POA: Insufficient documentation

## 2020-02-13 HISTORY — DX: Malignant (primary) neoplasm, unspecified: C80.1

## 2020-02-13 HISTORY — DX: Personal history of urinary calculi: Z87.442

## 2020-02-13 LAB — CBC
HCT: 41.1 % (ref 36.0–46.0)
Hemoglobin: 13.3 g/dL (ref 12.0–15.0)
MCH: 28.7 pg (ref 26.0–34.0)
MCHC: 32.4 g/dL (ref 30.0–36.0)
MCV: 88.8 fL (ref 80.0–100.0)
Platelets: 323 10*3/uL (ref 150–400)
RBC: 4.63 MIL/uL (ref 3.87–5.11)
RDW: 13 % (ref 11.5–15.5)
WBC: 9.1 10*3/uL (ref 4.0–10.5)
nRBC: 0 % (ref 0.0–0.2)

## 2020-02-13 LAB — SARS CORONAVIRUS 2 (TAT 6-24 HRS): SARS Coronavirus 2: NEGATIVE

## 2020-02-13 NOTE — Consult Note (Addendum)
[facility] Nurse requested for preoperative stoma site marking  Discussed surgical procedure and stoma creation with patient.  Explained role of the [facility] nurse team.  Provided the patient with educational booklet and provided samples of pouching options.  Answered patient's questions.   Examined patient sitting, and standing in order to place the marking in the patient's visual field, away from any creases or abdominal contour issues and within the rectus muscle.  Attempted to mark below the patient's belt line.   Marked for colostomy in the LLQ  __9__ cm to the left of the umbilicus and __5__[place] below the umbilicus.  Marked for ileostomy in the RLQ  _9___cm to the right of the umbilicus and  __6__ cm below the umbilicus.  Patient's abdomen cleansed with CHG wipes at site markings, allowed to air dry prior to marking.Covered mark with thin film transparent dressing to preserve mark until date of surgery. Provided pen and instructed pt to color in the marks if they begin to fade prior to surgery.  [facility] Nurse team will follow up with patient after surgery for continued ostomy care and teaching if she receives an ostomy during surgery.   Julien Girt MSN, RN, [place], [place], [place]

## 2020-02-14 ENCOUNTER — Encounter: Payer: Medicare Other | Admitting: Internal Medicine

## 2020-02-14 MED ORDER — BUPIVACAINE LIPOSOME 1.3 % IJ SUSP
20.0000 mL | INTRAMUSCULAR | Status: DC
  Filled 2020-02-14: qty 20

## 2020-02-15 ENCOUNTER — Inpatient Hospital Stay (HOSPITAL_COMMUNITY): Payer: Medicare Other | Admitting: Certified Registered Nurse Anesthetist

## 2020-02-15 ENCOUNTER — Other Ambulatory Visit: Payer: Self-pay

## 2020-02-15 ENCOUNTER — Encounter (HOSPITAL_COMMUNITY)
Admission: RE | Disposition: A | Discharge status: Home / Self Care | Payer: Self-pay | Source: Ambulatory Visit | Attending: General Surgery

## 2020-02-15 ENCOUNTER — Inpatient Hospital Stay (HOSPITAL_COMMUNITY)
Admission: RE | Admit: 2020-02-15 | Discharge: 2020-02-28 | DRG: 330 | Disposition: A | Discharge status: Home / Self Care | Payer: Medicare Other | Attending: General Surgery | Admitting: General Surgery

## 2020-02-15 ENCOUNTER — Encounter (HOSPITAL_COMMUNITY): Payer: Self-pay | Admitting: General Surgery

## 2020-02-15 DIAGNOSIS — C20 Malignant neoplasm of rectum: Secondary | ICD-10-CM | POA: Diagnosis not present

## 2020-02-15 DIAGNOSIS — Z20822 Contact with and (suspected) exposure to covid-19: Secondary | ICD-10-CM | POA: Diagnosis not present

## 2020-02-15 DIAGNOSIS — J4520 Mild intermittent asthma, uncomplicated: Secondary | ICD-10-CM | POA: Diagnosis not present

## 2020-02-15 DIAGNOSIS — K433 Parastomal hernia with obstruction, without gangrene: Secondary | ICD-10-CM | POA: Diagnosis not present

## 2020-02-15 DIAGNOSIS — R0789 Other chest pain: Secondary | ICD-10-CM | POA: Diagnosis not present

## 2020-02-15 DIAGNOSIS — R911 Solitary pulmonary nodule: Secondary | ICD-10-CM | POA: Diagnosis not present

## 2020-02-15 DIAGNOSIS — K567 Ileus, unspecified: Secondary | ICD-10-CM | POA: Diagnosis not present

## 2020-02-15 DIAGNOSIS — K219 Gastro-esophageal reflux disease without esophagitis: Secondary | ICD-10-CM | POA: Diagnosis present

## 2020-02-15 DIAGNOSIS — Z978 Presence of other specified devices: Secondary | ICD-10-CM

## 2020-02-15 DIAGNOSIS — Z0189 Encounter for other specified special examinations: Secondary | ICD-10-CM

## 2020-02-15 HISTORY — PX: XI ROBOTIC ASSISTED LOWER ANTERIOR RESECTION: SHX6558

## 2020-02-15 LAB — TYPE AND SCREEN
ABO/RH(D): A POS
Antibody Screen: NEGATIVE

## 2020-02-15 LAB — ABO/RH: ABO/RH(D): A POS

## 2020-02-15 SURGERY — RESECTION, RECTUM, LOW ANTERIOR, ROBOT-ASSISTED
Anesthesia: General | Site: Abdomen

## 2020-02-15 MED ORDER — PROPOFOL 10 MG/ML IV BOLUS
INTRAVENOUS | Status: DC | PRN
  Administered 2020-02-15: 150 mg via INTRAVENOUS

## 2020-02-15 MED ORDER — DIPHENHYDRAMINE HCL 12.5 MG/5ML PO ELIX: 12.5000 mg | ORAL_SOLUTION | Freq: Four times a day (QID) | ORAL | Status: DC | PRN

## 2020-02-15 MED ORDER — ONDANSETRON HCL 4 MG/2ML IJ SOLN
4.0000 mg | Freq: Four times a day (QID) | INTRAMUSCULAR | Status: DC | PRN
  Administered 2020-02-19 – 2020-02-25 (×6): 4 mg via INTRAVENOUS
  Filled 2020-02-15 (×8): qty 2

## 2020-02-15 MED ORDER — RINGERS IRRIGATION IR SOLN
Status: DC | PRN
  Administered 2020-02-15: 2000 mL
  Administered 2020-02-15: 1000 mL

## 2020-02-15 MED ORDER — GABAPENTIN 300 MG PO CAPS
300.0000 mg | ORAL_CAPSULE | Freq: Two times a day (BID) | ORAL | Status: DC
  Administered 2020-02-15 – 2020-02-28 (×20): 300 mg via ORAL
  Filled 2020-02-15 (×20): qty 1

## 2020-02-15 MED ORDER — ACETAMINOPHEN 500 MG PO TABS
1000.0000 mg | ORAL_TABLET | ORAL | Status: AC
  Administered 2020-02-15: 1000 mg via ORAL
  Filled 2020-02-15: qty 2

## 2020-02-15 MED ORDER — ENOXAPARIN SODIUM 40 MG/0.4ML ~~LOC~~ SOLN
40.0000 mg | SUBCUTANEOUS | Status: DC
  Administered 2020-02-16 – 2020-02-28 (×13): 40 mg via SUBCUTANEOUS
  Filled 2020-02-15 (×13): qty 0.4

## 2020-02-15 MED ORDER — HEPARIN SODIUM (PORCINE) 5000 UNIT/ML IJ SOLN
5000.0000 [IU] | Freq: Once | INTRAMUSCULAR | Status: AC
  Administered 2020-02-15: 5000 [IU] via SUBCUTANEOUS
  Filled 2020-02-15: qty 1

## 2020-02-15 MED ORDER — FENTANYL CITRATE (PF) 250 MCG/5ML IJ SOLN
INTRAMUSCULAR | Status: AC
  Filled 2020-02-15: qty 5

## 2020-02-15 MED ORDER — DEXAMETHASONE SODIUM PHOSPHATE 10 MG/ML IJ SOLN
INTRAMUSCULAR | Status: AC
  Filled 2020-02-15: qty 1

## 2020-02-15 MED ORDER — SACCHAROMYCES BOULARDII 250 MG PO CAPS
250.0000 mg | ORAL_CAPSULE | Freq: Two times a day (BID) | ORAL | Status: DC
  Administered 2020-02-15 – 2020-02-28 (×20): 250 mg via ORAL
  Filled 2020-02-15 (×20): qty 1

## 2020-02-15 MED ORDER — ROCURONIUM BROMIDE 10 MG/ML (PF) SYRINGE
PREFILLED_SYRINGE | INTRAVENOUS | Status: AC
  Filled 2020-02-15: qty 10

## 2020-02-15 MED ORDER — FENTANYL CITRATE (PF) 100 MCG/2ML IJ SOLN
INTRAMUSCULAR | Status: DC | PRN
  Administered 2020-02-15: 100 ug via INTRAVENOUS

## 2020-02-15 MED ORDER — SODIUM CHLORIDE 0.9 % IV SOLN
2.0000 g | Freq: Two times a day (BID) | INTRAVENOUS | Status: AC
  Administered 2020-02-15: 2 g via INTRAVENOUS
  Filled 2020-02-15: qty 2

## 2020-02-15 MED ORDER — 0.9 % SODIUM CHLORIDE (POUR BTL) OPTIME
TOPICAL | Status: DC | PRN
  Administered 2020-02-15: 2000 mL

## 2020-02-15 MED ORDER — METHYLENE BLUE 0.5 % INJ SOLN
INTRAVENOUS | Status: AC
  Filled 2020-02-15: qty 10

## 2020-02-15 MED ORDER — ONDANSETRON HCL 4 MG PO TABS
4.0000 mg | ORAL_TABLET | Freq: Four times a day (QID) | ORAL | Status: DC | PRN
  Administered 2020-02-24: 4 mg via ORAL
  Filled 2020-02-15: qty 1

## 2020-02-15 MED ORDER — SIMETHICONE 80 MG PO CHEW
40.0000 mg | CHEWABLE_TABLET | Freq: Four times a day (QID) | ORAL | Status: DC | PRN
  Administered 2020-02-17 – 2020-02-24 (×4): 40 mg via ORAL
  Filled 2020-02-15 (×4): qty 1

## 2020-02-15 MED ORDER — KCL IN DEXTROSE-NACL 20-5-0.45 MEQ/L-%-% IV SOLN
INTRAVENOUS | Status: DC
  Filled 2020-02-15: qty 1000

## 2020-02-15 MED ORDER — DIPHENHYDRAMINE HCL 50 MG/ML IJ SOLN: 12.5000 mg | Freq: Four times a day (QID) | INTRAMUSCULAR | Status: DC | PRN

## 2020-02-15 MED ORDER — GABAPENTIN 300 MG PO CAPS
300.0000 mg | ORAL_CAPSULE | ORAL | Status: AC
  Administered 2020-02-15: 300 mg via ORAL
  Filled 2020-02-15: qty 1

## 2020-02-15 MED ORDER — BUPIVACAINE LIPOSOME 1.3 % IJ SUSP
INTRAMUSCULAR | Status: DC | PRN
  Administered 2020-02-15: 20 mL

## 2020-02-15 MED ORDER — PHENYLEPHRINE HCL-NACL 10-0.9 MG/250ML-% IV SOLN
INTRAVENOUS | Status: DC | PRN
Start: 2020-02-15 — End: 2020-02-15
  Administered 2020-02-15: 50 ug/min via INTRAVENOUS

## 2020-02-15 MED ORDER — ACETAMINOPHEN 500 MG PO TABS
1000.0000 mg | ORAL_TABLET | Freq: Four times a day (QID) | ORAL | Status: AC
  Administered 2020-02-15 – 2020-02-22 (×15): 1000 mg via ORAL
  Filled 2020-02-15 (×17): qty 2

## 2020-02-15 MED ORDER — MIDAZOLAM HCL 2 MG/2ML IJ SOLN
INTRAMUSCULAR | Status: AC
  Filled 2020-02-15: qty 2

## 2020-02-15 MED ORDER — HYDROMORPHONE HCL 1 MG/ML IJ SOLN
0.5000 mg | INTRAMUSCULAR | Status: DC | PRN
  Administered 2020-02-16 – 2020-02-26 (×39): 0.5 mg via INTRAVENOUS
  Filled 2020-02-15 (×43): qty 0.5

## 2020-02-15 MED ORDER — ALBUTEROL SULFATE (2.5 MG/3ML) 0.083% IN NEBU: 3.0000 mL | INHALATION_SOLUTION | Freq: Four times a day (QID) | RESPIRATORY_TRACT | Status: DC | PRN

## 2020-02-15 MED ORDER — LACTATED RINGERS IV SOLN: INTRAVENOUS | Status: DC

## 2020-02-15 MED ORDER — ORAL CARE MOUTH RINSE: 15.0000 mL | Freq: Once | OROMUCOSAL | Status: AC

## 2020-02-15 MED ORDER — ONDANSETRON HCL 4 MG/2ML IJ SOLN
INTRAMUSCULAR | Status: DC | PRN
  Administered 2020-02-15: 4 mg via INTRAVENOUS

## 2020-02-15 MED ORDER — PROPOFOL 10 MG/ML IV BOLUS
INTRAVENOUS | Status: AC
  Filled 2020-02-15: qty 20

## 2020-02-15 MED ORDER — ALUM & MAG HYDROXIDE-SIMETH 200-200-20 MG/5ML PO SUSP
30.0000 mL | Freq: Four times a day (QID) | ORAL | Status: DC | PRN
  Administered 2020-02-17 – 2020-02-25 (×5): 30 mL via ORAL
  Filled 2020-02-15 (×6): qty 30

## 2020-02-15 MED ORDER — ENSURE SURGERY PO LIQD
237.0000 mL | Freq: Two times a day (BID) | ORAL | Status: DC
  Administered 2020-02-16 – 2020-02-27 (×8): 237 mL via ORAL
  Filled 2020-02-15 (×24): qty 237

## 2020-02-15 MED ORDER — ALVIMOPAN 12 MG PO CAPS
12.0000 mg | ORAL_CAPSULE | ORAL | Status: AC
  Administered 2020-02-15: 12 mg via ORAL
  Filled 2020-02-15: qty 1

## 2020-02-15 MED ORDER — ONDANSETRON HCL 4 MG/2ML IJ SOLN
INTRAMUSCULAR | Status: AC
  Filled 2020-02-15: qty 2

## 2020-02-15 MED ORDER — FENTANYL CITRATE (PF) 100 MCG/2ML IJ SOLN
INTRAMUSCULAR | Status: AC
  Filled 2020-02-15: qty 2

## 2020-02-15 MED ORDER — TRAMADOL HCL 50 MG PO TABS
50.0000 mg | ORAL_TABLET | Freq: Four times a day (QID) | ORAL | Status: DC | PRN
  Administered 2020-02-15 – 2020-02-27 (×9): 50 mg via ORAL
  Filled 2020-02-15 (×10): qty 1

## 2020-02-15 MED ORDER — PHENYLEPHRINE HCL (PRESSORS) 10 MG/ML IV SOLN
INTRAVENOUS | Status: AC
  Filled 2020-02-15: qty 1

## 2020-02-15 MED ORDER — ROCURONIUM BROMIDE 10 MG/ML (PF) SYRINGE
PREFILLED_SYRINGE | INTRAVENOUS | Status: DC | PRN
  Administered 2020-02-15: 60 mg via INTRAVENOUS

## 2020-02-15 MED ORDER — CHLORHEXIDINE GLUCONATE 0.12 % MT SOLN
15.0000 mL | Freq: Once | OROMUCOSAL | Status: AC
  Administered 2020-02-15: 15 mL via OROMUCOSAL

## 2020-02-15 MED ORDER — SODIUM CHLORIDE 0.9 % IV SOLN
2.0000 g | INTRAVENOUS | Status: AC
  Administered 2020-02-15: 2 g via INTRAVENOUS
  Filled 2020-02-15: qty 2

## 2020-02-15 MED ORDER — MIDAZOLAM HCL 5 MG/5ML IJ SOLN
INTRAMUSCULAR | Status: DC | PRN
  Administered 2020-02-15: 2 mg via INTRAVENOUS

## 2020-02-15 MED ORDER — PHENYLEPHRINE 40 MCG/ML (10ML) SYRINGE FOR IV PUSH (FOR BLOOD PRESSURE SUPPORT)
PREFILLED_SYRINGE | INTRAVENOUS | Status: DC | PRN
  Administered 2020-02-15: 80 ug via INTRAVENOUS

## 2020-02-15 MED ORDER — ALVIMOPAN 12 MG PO CAPS
12.0000 mg | ORAL_CAPSULE | Freq: Two times a day (BID) | ORAL | Status: DC
  Administered 2020-02-16 (×2): 12 mg via ORAL
  Filled 2020-02-15 (×2): qty 1

## 2020-02-15 MED ORDER — FENTANYL CITRATE (PF) 100 MCG/2ML IJ SOLN
25.0000 ug | INTRAMUSCULAR | Status: DC | PRN
  Administered 2020-02-15 (×2): 25 ug via INTRAVENOUS

## 2020-02-15 MED ORDER — LIDOCAINE 2% (20 MG/ML) 5 ML SYRINGE
INTRAMUSCULAR | Status: DC | PRN
  Administered 2020-02-15: 60 mg via INTRAVENOUS

## 2020-02-15 MED ORDER — LIDOCAINE 2% (20 MG/ML) 5 ML SYRINGE
INTRAMUSCULAR | Status: AC
  Filled 2020-02-15: qty 5

## 2020-02-15 MED ORDER — BUPIVACAINE-EPINEPHRINE 0.25% -1:200000 IJ SOLN
INTRAMUSCULAR | Status: DC | PRN
  Administered 2020-02-15: 30 mL

## 2020-02-15 MED ORDER — BUPIVACAINE-EPINEPHRINE (PF) 0.25% -1:200000 IJ SOLN
INTRAMUSCULAR | Status: AC
  Filled 2020-02-15: qty 30

## 2020-02-15 MED ORDER — DEXAMETHASONE SODIUM PHOSPHATE 10 MG/ML IJ SOLN
INTRAMUSCULAR | Status: DC | PRN
  Administered 2020-02-15: 10 mg via INTRAVENOUS

## 2020-02-15 SURGICAL SUPPLY — 92 items
BLADE EXTENDED COATED 6.5IN (ELECTRODE) IMPLANT
CANNULA REDUC XI 12-8 STAPL (CANNULA) ×2
CANNULA REDUCER 12-8 DVNC XI (CANNULA) ×1 IMPLANT
CELLS DAT CNTRL 66122 CELL SVR (MISCELLANEOUS) IMPLANT
COVER SURGICAL LIGHT HANDLE (MISCELLANEOUS) ×4 IMPLANT
COVER TIP SHEARS 8 DVNC (MISCELLANEOUS) ×1 IMPLANT
COVER TIP SHEARS 8MM DA VINCI (MISCELLANEOUS) ×2
COVER WAND RF STERILE (DRAPES) IMPLANT
DECANTER SPIKE VIAL GLASS SM (MISCELLANEOUS) ×2 IMPLANT
DERMABOND ADVANCED (GAUZE/BANDAGES/DRESSINGS) ×1
DERMABOND ADVANCED .7 DNX12 (GAUZE/BANDAGES/DRESSINGS) ×1 IMPLANT
DRAIN CHANNEL 19F RND (DRAIN) IMPLANT
DRAPE ARM DVNC X/XI (DISPOSABLE) ×4 IMPLANT
DRAPE COLUMN DVNC XI (DISPOSABLE) ×1 IMPLANT
DRAPE DA VINCI XI ARM (DISPOSABLE) ×8
DRAPE DA VINCI XI COLUMN (DISPOSABLE) ×2
DRAPE SURG IRRIG POUCH 19X23 (DRAPES) ×2 IMPLANT
DRSG OPSITE POSTOP 4X10 (GAUZE/BANDAGES/DRESSINGS) IMPLANT
DRSG OPSITE POSTOP 4X6 (GAUZE/BANDAGES/DRESSINGS) IMPLANT
DRSG OPSITE POSTOP 4X8 (GAUZE/BANDAGES/DRESSINGS) IMPLANT
DRSG TEGADERM 4X4.75 (GAUZE/BANDAGES/DRESSINGS) ×2 IMPLANT
ELECT PENCIL ROCKER SW 15FT (MISCELLANEOUS) ×2 IMPLANT
ELECT REM PT RETURN 15FT ADLT (MISCELLANEOUS) ×2 IMPLANT
ENDOLOOP SUT PDS II  0 18 (SUTURE)
ENDOLOOP SUT PDS II 0 18 (SUTURE) IMPLANT
EVACUATOR SILICONE 100CC (DRAIN) IMPLANT
GAUZE SPONGE 2X2 8PLY STRL LF (GAUZE/BANDAGES/DRESSINGS) ×1 IMPLANT
GLOVE BIO SURGEON STRL SZ 6.5 (GLOVE) ×6 IMPLANT
GLOVE BIOGEL PI IND STRL 7.0 (GLOVE) ×3 IMPLANT
GLOVE BIOGEL PI INDICATOR 7.0 (GLOVE) ×3
GOWN STRL REUS W/TWL XL LVL3 (GOWN DISPOSABLE) ×6 IMPLANT
GRASPER SUT TROCAR 14GX15 (MISCELLANEOUS) IMPLANT
HOLDER FOLEY CATH W/STRAP (MISCELLANEOUS) ×2 IMPLANT
IRRIG SUCT STRYKERFLOW 2 WTIP (MISCELLANEOUS) ×2
IRRIGATION SUCT STRKRFLW 2 WTP (MISCELLANEOUS) ×1 IMPLANT
KIT PROCEDURE DA VINCI SI (MISCELLANEOUS) ×2
KIT PROCEDURE DVNC SI (MISCELLANEOUS) ×1 IMPLANT
KIT SIGMOIDOSCOPE (SET/KITS/TRAYS/PACK) ×2 IMPLANT
KIT TURNOVER KIT A (KITS) IMPLANT
NEEDLE INSUFFLATION 14GA 120MM (NEEDLE) ×2 IMPLANT
PACK CARDIOVASCULAR III (CUSTOM PROCEDURE TRAY) ×2 IMPLANT
PACK COLON (CUSTOM PROCEDURE TRAY) ×2 IMPLANT
PAD POSITIONING PINK XL (MISCELLANEOUS) ×2 IMPLANT
PORT LAP GEL ALEXIS MED 5-9CM (MISCELLANEOUS) ×2 IMPLANT
RELOAD STAPLER 4.3X45 GRN DVNC (STAPLE) IMPLANT
RELOAD STAPLER 4.3X60 GRN DVNC (STAPLE) ×2 IMPLANT
RTRCTR WOUND ALEXIS 18CM MED (MISCELLANEOUS)
SCISSORS LAP 5X35 DISP (ENDOMECHANICALS) ×2 IMPLANT
SEAL CANN UNIV 5-8 DVNC XI (MISCELLANEOUS) ×4 IMPLANT
SEAL XI 5MM-8MM UNIVERSAL (MISCELLANEOUS) ×8
SEALER VESSEL DA VINCI XI (MISCELLANEOUS) ×2
SEALER VESSEL EXT DVNC XI (MISCELLANEOUS) ×1 IMPLANT
SOLUTION ELECTROLUBE (MISCELLANEOUS) ×2 IMPLANT
SPONGE GAUZE 2X2 STER 10/PKG (GAUZE/BANDAGES/DRESSINGS) ×1
STAPLER 45 DA VINCI SURE FORM (STAPLE)
STAPLER 45 SUREFORM DVNC (STAPLE) IMPLANT
STAPLER 60 DA VINCI SURE FORM (STAPLE) ×2
STAPLER 60 SUREFORM DVNC (STAPLE) ×1 IMPLANT
STAPLER CANNULA SEAL DVNC XI (STAPLE) IMPLANT
STAPLER CANNULA SEAL XI (STAPLE)
STAPLER ECHELON POWER CIR 29 (STAPLE) ×2 IMPLANT
STAPLER RELOAD 4.3X45 GREEN (STAPLE)
STAPLER RELOAD 4.3X45 GRN DVNC (STAPLE)
STAPLER RELOAD 4.3X60 GREEN (STAPLE) ×4
STAPLER RELOAD 4.3X60 GRN DVNC (STAPLE) ×2
STOPCOCK 4 WAY LG BORE MALE ST (IV SETS) ×4 IMPLANT
SUT ETHILON 2 0 PS N (SUTURE) IMPLANT
SUT NOVA NAB DX-16 0-1 5-0 T12 (SUTURE) ×4 IMPLANT
SUT PROLENE 2 0 KS (SUTURE) IMPLANT
SUT SILK 2 0 (SUTURE) ×2
SUT SILK 2 0 SH CR/8 (SUTURE) IMPLANT
SUT SILK 2-0 18XBRD TIE 12 (SUTURE) ×1 IMPLANT
SUT SILK 3 0 (SUTURE)
SUT SILK 3 0 SH CR/8 (SUTURE) ×2 IMPLANT
SUT SILK 3-0 18XBRD TIE 12 (SUTURE) IMPLANT
SUT V-LOC BARB 180 2/0GR6 GS22 (SUTURE)
SUT VIC AB 2-0 SH 18 (SUTURE) IMPLANT
SUT VIC AB 2-0 SH 27 (SUTURE)
SUT VIC AB 2-0 SH 27X BRD (SUTURE) IMPLANT
SUT VIC AB 3-0 SH 18 (SUTURE) IMPLANT
SUT VIC AB 4-0 PS2 27 (SUTURE) ×4 IMPLANT
SUT VICRYL 0 UR6 27IN ABS (SUTURE) ×2 IMPLANT
SUTURE V-LC BRB 180 2/0GR6GS22 (SUTURE) IMPLANT
SYR 10ML ECCENTRIC (SYRINGE) ×2 IMPLANT
SYS LAPSCP GELPORT 120MM (MISCELLANEOUS)
SYSTEM LAPSCP GELPORT 120MM (MISCELLANEOUS) IMPLANT
TOWEL OR 17X26 10 PK STRL BLUE (TOWEL DISPOSABLE) IMPLANT
TOWEL OR NON WOVEN STRL DISP B (DISPOSABLE) ×2 IMPLANT
TRAY FOLEY MTR SLVR 14FR STAT (SET/KITS/TRAYS/PACK) ×2 IMPLANT
TROCAR ADV FIXATION 5X100MM (TROCAR) ×2 IMPLANT
TUBING CONNECTING 10 (TUBING) ×4 IMPLANT
TUBING INSUFFLATION 10FT LAP (TUBING) ×2 IMPLANT

## 2020-02-15 NOTE — Interval H&P Note (Signed)
History and Physical Interval Note:  02/15/2020 12:42 PM  Monte Sereno  has presented today for surgery, with the diagnosis of RECTAL CANCER.  The various methods of treatment have been discussed with the patient and family. After consideration of risks, benefits and other options for treatment, the patient has consented to  Procedure(s): ROBOTIC LOW ANTERIOR RESECTION, DIVERTING ILEOSTOMY (N/A) as a surgical intervention.  The patient's history has been reviewed, patient examined, no change in status, stable for surgery.  I have reviewed the patient's chart and labs.  Questions were answered to the patient's satisfaction.     Rosario Adie, MD  [facility]

## 2020-02-15 NOTE — Anesthesia Procedure Notes (Signed)
Procedure Name: Intubation Date/Time: 02/15/2020 1:50 AM Performed by: Gerald Leitz, CRNA Pre-anesthesia Checklist: Patient identified, Patient being monitored, Timeout performed, Emergency Drugs available and Suction available Patient Re-evaluated:Patient Re-evaluated prior to induction Oxygen Delivery Method: Circle system utilized Preoxygenation: Pre-oxygenation with 100% oxygen Induction Type: IV induction Ventilation: Mask ventilation without difficulty Laryngoscope Size: Mac and 3 Grade View: Grade I Tube type: Oral Tube size: 7.0 mm Number of attempts: 1 Airway Equipment and Method: Stylet and Video-laryngoscopy Placement Confirmation: ETT inserted through vocal cords under direct vision,  positive ETCO2 and breath sounds checked- equal and bilateral Secured at: 21 cm Tube secured with: Tape Dental Injury: Teeth and Oropharynx as per pre-operative assessment

## 2020-02-15 NOTE — Anesthesia Postprocedure Evaluation (Signed)
Anesthesia Post Note  Patient: Laura Mcdonald  Procedure(s) Performed: ROBOTIC LOW ANTERIOR RESECTION, DIVERTING ILEOSTOMY (N/A Abdomen)     Patient location during evaluation: PACU Anesthesia Type: General Level of consciousness: awake and alert Pain management: pain level controlled Vital Signs Assessment: post-procedure vital signs reviewed and stable Respiratory status: spontaneous breathing, nonlabored ventilation, respiratory function stable and patient connected to nasal cannula oxygen Cardiovascular status: blood pressure returned to baseline and stable Postop Assessment: no apparent nausea or vomiting Anesthetic complications: no   No complications documented.  Last Vitals:  Vitals:   02/15/20 1950 02/15/20 2044  BP: (!) 159/77 123/68  Pulse: 80 67  Resp: 18 18  Temp: 36.8 C 36.6 C  SpO2: 100% 100%    Last Pain:  Vitals:   02/15/20 2044  TempSrc: Oral  PainSc:                  Effie Berkshire

## 2020-02-15 NOTE — Anesthesia Preprocedure Evaluation (Addendum)
Anesthesia Evaluation  Patient identified by MRN, date of birth, ID band Patient awake    Reviewed: Allergy & Precautions, NPO status , Patient's Chart, lab work & pertinent test results  Airway Mallampati: I  TM Distance: >3 FB Neck ROM: Full    Dental no notable dental hx. (+) Partial Upper,    Pulmonary asthma , former smoker,    Pulmonary exam normal breath sounds clear to auscultation       Cardiovascular negative cardio ROS Normal cardiovascular exam Rhythm:Regular Rate:Normal     Neuro/Psych negative neurological ROS  negative psych ROS   GI/Hepatic Neg liver ROS, GERD  ,  Endo/Other  negative endocrine ROS  Renal/GU negative Renal ROS  negative genitourinary   Musculoskeletal negative musculoskeletal ROS (+)   Abdominal   Peds  Hematology negative hematology ROS (+)   Anesthesia Other Findings   Reproductive/Obstetrics                           Anesthesia Physical Anesthesia Plan  ASA: II  Anesthesia Plan: General   Post-op Pain Management:    Induction: Intravenous  PONV Risk Score and Plan: 3 and Midazolam, Dexamethasone and Ondansetron  Airway Management Planned: Oral ETT and Video Laryngoscope Planned  Additional Equipment:   Intra-op Plan:   Post-operative Plan: Extubation in OR  Informed Consent: I have reviewed the patients History and Physical, chart, labs and discussed the procedure including the risks, benefits and alternatives for the proposed anesthesia with the patient or authorized representative who has indicated his/her understanding and acceptance.     Dental advisory given  Plan Discussed with: CRNA  Anesthesia Plan Comments:        Anesthesia Quick Evaluation

## 2020-02-15 NOTE — Transfer of Care (Signed)
Immediate Anesthesia Transfer of Care Note  Patient: Laura Mcdonald  Procedure(s) Performed: Procedure(s): ROBOTIC LOW ANTERIOR RESECTION, DIVERTING ILEOSTOMY (N/A)  Patient Location: PACU  Anesthesia Type:General  Level of Consciousness: Alert, Awake, Oriented  Airway & Oxygen Therapy: Patient Spontanous Breathing  Post-op Assessment: Report given to RN  Post vital signs: Reviewed and stable  Last Vitals:  Vitals:   02/15/20 1223  BP: (!) 180/95  Pulse: 73  Resp: 16  Temp: 36.7 C  SpO2: 171%    Complications: No apparent anesthesia complications

## 2020-02-15 NOTE — Op Note (Signed)
02/15/2020  5:46 PM  PATIENT:  Laura Mcdonald  64 y.o. female  Patient Care Team: Isaac Bliss, Rayford Halsted, MD as PCP - General (Internal Medicine) Jonnie Finner, RN as Oncology Nurse Navigator Alla Feeling, NP as Nurse Practitioner (Nurse Practitioner) Mansouraty, Telford Nab., MD as Consulting Physician (Gastroenterology) Leighton Ruff, MD as Consulting Physician (General Surgery)  PRE-OPERATIVE DIAGNOSIS:  RECTAL CANCER  POST-OPERATIVE DIAGNOSIS:  RECTAL CANCER  PROCEDURE:  Procedure(s): ROBOTIC LOW ANTERIOR RESECTION, DIVERTING ILEOSTOMY  SURGEON:  Surgeon(s): Michael Boston, MD Leighton Ruff, MD  ASSISTANT: Dr Johney Maine   ANESTHESIA:   local and general  EBL:  Total I/O In: 100 [IV Piggyback:100] Out: 8 [Urine:350; Blood:75]  Delay start of Pharmacological VTE agent (>24hrs) due to surgical blood loss or risk of bleeding:  no  DRAINS: (26F) Jackson-Pratt drain(s) with closed bulb suction in the pelvis   SPECIMEN:  Source of Specimen:  Rectosigmoid colon  DISPOSITION OF SPECIMEN:  PATHOLOGY  COUNTS:  YES  PLAN OF CARE: Admit to inpatient   PATIENT DISPOSITION:  PACU - hemodynamically stable.  INDICATION:    64 y.o. F with T1/2 mid rectal tumor on MRI.  Stage 1.  I recommended low anterior resection:  The anatomy & physiology of the digestive tract was discussed.  The pathophysiology was discussed.  Natural history risks without surgery was discussed.   I worked to give an overview of the disease and the frequent need to have multispecialty involvement.  I feel the risks of no intervention will lead to serious problems that outweigh the operative risks; therefore, I recommended a partial colectomy to remove the pathology.  Laparoscopic & open techniques were discussed.   Risks such as bleeding, infection, abscess, leak, reoperation, possible ostomy, hernia, heart attack, death, and other risks were discussed.  I noted a good likelihood this will help  address the problem.   Goals of post-operative recovery were discussed as well.    The patient expressed understanding & wished to proceed with surgery.  OR FINDINGS:   Patient had tattoo noted at the peritoneal reflection  No obvious metastatic disease on visceral parietal peritoneum or liver.  The anastomosis rests 6 cm from the anal verge by rigid proctoscopy.  DESCRIPTION:   Informed consent was confirmed.  The patient underwent general anaesthesia without difficulty.  The patient was positioned appropriately.  VTE prevention in place.  The patient's abdomen was clipped, prepped, & draped in a sterile fashion.  Surgical timeout confirmed our plan.  The patient was positioned in reverse Trendelenburg.  Abdominal entry was gained using a Varies needle.  Entry was clean.  I induced carbon dioxide insufflation.  An 57mm robotic port was placed in the RUQ.  Camera inspection revealed no injury.  Extra ports were carefully placed under direct laparoscopic visualization.  I laparoscopically reflected the greater omentum and the upper abdomen the small bowel in the upper abdomen. The patient was appropriately positioned and the robot was docked to the patient's left side.  Instruments were placed under direct visualization.    I mobilized the sigmoid colon off of the pelvic sidewall.  I scored the base of peritoneum of the right side of the mesentery of the left colon from the ligament of Treitz to the peritoneal reflection of the mid rectum.   I elevated the sigmoid mesentery and enetered into the retro-mesenteric plane. We were able to identify the left ureter and gonadal vessels. We kept those posterior within the retroperitoneum and elevated the left  colon mesentery off that. I did isolated IMA pedicle but did not ligate it yet.  I continued distally and got into the avascular plane posterior to the mesorectum. This allowed me to help mobilize the rectum as well by freeing the mesorectum off the  sacrum.  I mobilized the peritoneal coverings towards the peritoneal reflection on both the right and left sides of the rectum.  I could see the right and left ureters and stayed away from them.    I skeletonized the inferior mesenteric artery pedicle.  I went down to its takeoff from the aorta.   I isolated the inferior mesenteric vein off of the ligament of Treitz just cephalad to that as well.  After confirming the left ureter was out of the way, I went ahead and ligated the inferior mesenteric artery pedicle with bipolar robotic vessel sealer ~2cm above its takeoff from the aorta.   I did ligate the inferior mesenteric vein in a similar fashion.  We ensured hemostasis.  I continued down the mesenteric plane using the robotic vessel sealer and blunt dissection.  I divided down to the pelvic floor.  I then came around laterally and divided the lateral stalks on either side.  I continue my dissection up to the peritoneal reflection and divided this with the robotic vessel sealer.  I bluntly mobilized just distal to the tattoo mark.  I then skeletonized the mesorectum from the rectum using the robotic vessel sealer just distal to the tattoo.  Once this was complete a green load robotic stapler was placed into the right upper quadrant port site and the distal rectum was transected using 2 green load staplers.  I divided the mesentery of the proximal sigmoid colon.  We then injected 3 mL of indocyanine green intravenously to assess for perfusion.  There was good perfusion of the remaining colon and distal rectum.  I then made a enlargement of the 12 mm port and dissected down to the level of the fascia using electrocautery.  I made a cruciate incision on the fascia and bluntly dilated the peritoneum.  An [place] wound protector was placed.  The colon was brought out through this wound protector and transected at the previously marked margin.  This was done over a pursestring device and a 2 0 pursestring suture was  placed.  A 29 mm EEA anvil was then placed into the colon and the pursestring was tied tightly around this.  This was then placed back into the abdomen after the fat had been cleared around the anvil.  The abdomen was reinsufflated.  The EEA stapler was inserted into the rectum and brought out through the distal rectal stump.  An anastomosis was created.  There was no tension.  There was no leak when tested with insufflation under irrigation.  The anastomosis this approximately 6 cm from the sphincter complex.  At this point the terminal ileum was mobilized and brought out through the [facility] wound protector.  We confirmed proximal and distal limbs and then placed a red rubber catheter underneath of the bowel.  At this point the abdomen was desufflated and the [place] wound protector was removed.  The ports were removed as well and the port sites were closed using 4-0 Vicryl suture and Dermabond.  The ileostomy was then matured in standard [place] fashion using interrupted 2-0 Vicryl sutures.  An ostomy appliance was placed.  The patient was then awakened from anesthesia and sent to the postanesthesia care unit in stable condition.  All  counts were correct per operating room staff. An MD assistant was necessary for tissue manipulation, retraction and positioning due to the complexity of the case and hospital policies

## 2020-02-16 ENCOUNTER — Encounter (HOSPITAL_COMMUNITY): Payer: Self-pay | Admitting: General Surgery

## 2020-02-16 LAB — CBC
HCT: 36.6 % (ref 36.0–46.0)
Hemoglobin: 12.1 g/dL (ref 12.0–15.0)
MCH: 28.9 pg (ref 26.0–34.0)
MCHC: 33.1 g/dL (ref 30.0–36.0)
MCV: 87.4 fL (ref 80.0–100.0)
Platelets: 286 10*3/uL (ref 150–400)
RBC: 4.19 MIL/uL (ref 3.87–5.11)
RDW: 12.8 % (ref 11.5–15.5)
WBC: 14.2 10*3/uL — ABNORMAL HIGH (ref 4.0–10.5)
nRBC: 0 % (ref 0.0–0.2)

## 2020-02-16 LAB — BASIC METABOLIC PANEL
Anion gap: 7 (ref 5–15)
BUN: 11 mg/dL (ref 8–23)
CO2: 24 mmol/L (ref 22–32)
Calcium: 9.4 mg/dL (ref 8.9–10.3)
Chloride: 106 mmol/L (ref 98–111)
Creatinine, Ser: 0.91 mg/dL (ref 0.44–1.00)
GFR calc Af Amer: >60 mL/min (ref >60)
GFR calc non Af Amer: >60 mL/min (ref >60)
Glucose, Bld: 169 mg/dL — ABNORMAL HIGH (ref 70–99)
Potassium: 4 mmol/L (ref 3.5–5.1)
Sodium: 137 mmol/L (ref 135–145)

## 2020-02-16 NOTE — Consult Note (Addendum)
[place] Nurse ostomy consult note Pt had ileostomy surgery performed on 7/8.  Stoma type/location: Stoma is red and viable, slightly above skin level, red rubber rod in place. Stoma is 1 1/2 inches. Current pouch was leaking behind the barrier. Peristomal assessment:  Intact skin surrounding Output: 50cc liquid brown stool  Ostomy pouching: 2pc.  Education provided:  Demonstrated pouch change using barrier ring to attempt to maintain a seal, and 2 piece pouch.  Pt watched the procedure using a hand held mirror and asked appropriate questions.  She was able to open and close velcro to empty.  Educational materials left at the bedside and 5 sets of barrier rings/wafers/pouches.  Discussed dieatry precautions and reviewed pouching routines and ordering supplies.  Enrolled patient in [place] program: Yes [place] team member will see again on Mon to perform another teaching session.  Julien Girt MSN, RN, [place], [place], [place]

## 2020-02-16 NOTE — Progress Notes (Signed)
1 Day Post-Op Robotic LAR and diverting ileostomy Subjective: Doing well, pain controlled, tolerating clears, ambulating in the hall  Objective: Vital signs in last 24 hours: Temp:  [97.5 F (36.4 C)-98.7 F (37.1 C)] 98.2 F (36.8 C) (07/09 0639) Pulse Rate:  [50-82] 73 (07/09 0639) Resp:  [11-19] 18 (07/09 0639) BP: (115-180)/(64-101) 125/65 (07/09 0639) SpO2:  [96 %-100 %] 96 % (07/09 0639) Weight:  [93.9 kg] 93.9 kg (07/09 0151)   Intake/Output from previous day: 07/08 0701 - 07/09 0700 In: 2800.1 [P.O.:480; I.V.:2220.1; IV Piggyback:100] Out: 2595 [Urine:2125; Drains:395; Blood:75] Intake/Output this shift: No intake/output data recorded.   General appearance: alert and cooperative GI: normal findings: soft, non-tender Ostomy beefy red Incision: no significant drainage  Lab Results:  Recent Labs    02/13/20 1348 02/16/20 0430  WBC 9.1 14.2*  HGB 13.3 12.1  HCT 41.1 36.6  PLT 323 286   BMET Recent Labs    02/16/20 0430  NA 137  K 4.0  CL 106  CO2 24  GLUCOSE 169*  BUN 11  CREATININE 0.91  CALCIUM 9.4   PT/INR No results for input(s): LABPROT, INR in the last 72 hours. ABG No results for input(s): PHART, HCO3 in the last 72 hours.  Invalid input(s): PCO2, PO2  MEDS, Scheduled . acetaminophen  1,000 mg Oral Q6H  . alvimopan  12 mg Oral BID  . enoxaparin (LOVENOX) injection  40 mg Subcutaneous Q24H  . feeding supplement  237 mL Oral BID BM  . gabapentin  300 mg Oral BID  . saccharomyces boulardii  250 mg Oral BID    Studies/Results: No results found.  Assessment: s/p Procedure(s): ROBOTIC LOW ANTERIOR RESECTION, DIVERTING ILEOSTOMY Patient Active Problem List   Diagnosis Date Noted  . Rectal cancer ([place]) 01/22/2020  . Rectal polyp 12/31/2019  . Abnormal colonoscopy 12/31/2019  . Mild intermittent asthma   . Cervical cancer ([place])   . KNEE PAIN, RIGHT 03/13/2008  . ALLERGIC RHINITIS 04/29/2007  . GERD 04/29/2007    Expected post op  course  Plan: Advance diet as tolerated Ambulate Ostomy teaching   LOS: 1 day     .Rosario Adie, MD [facility] Surgery, [place]    02/16/2020 7:46 AM

## 2020-02-17 LAB — CBC
HCT: 36.7 % (ref 36.0–46.0)
Hemoglobin: 12.2 g/dL (ref 12.0–15.0)
MCH: 29 pg (ref 26.0–34.0)
MCHC: 33.2 g/dL (ref 30.0–36.0)
MCV: 87.2 fL (ref 80.0–100.0)
Platelets: 301 10*3/uL (ref 150–400)
RBC: 4.21 MIL/uL (ref 3.87–5.11)
RDW: 12.9 % (ref 11.5–15.5)
WBC: 15.1 10*3/uL — ABNORMAL HIGH (ref 4.0–10.5)
nRBC: 0 % (ref 0.0–0.2)

## 2020-02-17 LAB — BASIC METABOLIC PANEL
Anion gap: 6 (ref 5–15)
BUN: 13 mg/dL (ref 8–23)
CO2: 24 mmol/L (ref 22–32)
Calcium: 9.7 mg/dL (ref 8.9–10.3)
Chloride: 104 mmol/L (ref 98–111)
Creatinine, Ser: 0.82 mg/dL (ref 0.44–1.00)
GFR calc Af Amer: >60 mL/min (ref >60)
GFR calc non Af Amer: >60 mL/min (ref >60)
Glucose, Bld: 115 mg/dL — ABNORMAL HIGH (ref 70–99)
Potassium: 3.8 mmol/L (ref 3.5–5.1)
Sodium: 134 mmol/L — ABNORMAL LOW (ref 135–145)

## 2020-02-17 MED ORDER — CHLORHEXIDINE GLUCONATE CLOTH 2 % EX PADS
6.0000 | MEDICATED_PAD | Freq: Every day | CUTANEOUS | Status: DC
  Administered 2020-02-17: 6 via TOPICAL

## 2020-02-17 NOTE — Progress Notes (Signed)
MD Rosendo Gros notified of bleeding lap site, ice applied and pressure applied. Reinforced with gauze.

## 2020-02-17 NOTE — Progress Notes (Signed)
2 Days Post-Op   Subjective/Chief Complaint: She has no complaints this morning. She is tolerating p.o.   Objective: Vital signs in last 24 hours: Temp:  [98.3 F (36.8 C)-98.7 F (37.1 C)] 98.3 F (36.8 C) (07/10 0519) Pulse Rate:  [65-73] 73 (07/10 0519) Resp:  [16-18] 18 (07/10 0519) BP: (120-154)/(70-78) 142/78 (07/10 0519) SpO2:  [94 %-97 %] 94 % (07/10 0519) Last BM Date: 02/17/20  Intake/Output from previous day: 07/09 0701 - 07/10 0700 In: 840 [P.O.:840] Out: 3775 [Urine:3650; Drains:75; Stool:50] Intake/Output this shift: Total I/O In: 0  Out: 505 [Urine:400; Drains:105]  Exam: Awake and alert Abdomen is soft.  Ostomy is productive.  Drain is serosanguineous  Lab Results:  Recent Labs    02/16/20 0430 02/17/20 0517  WBC 14.2* 15.1*  HGB 12.1 12.2  HCT 36.6 36.7  PLT 286 301   BMET Recent Labs    02/16/20 0430 02/17/20 0517  NA 137 134*  K 4.0 3.8  CL 106 104  CO2 24 24  GLUCOSE 169* 115*  BUN 11 13  CREATININE 0.91 0.82  CALCIUM 9.4 9.7   PT/INR No results for input(s): LABPROT, INR in the last 72 hours. ABG No results for input(s): PHART, HCO3 in the last 72 hours.  Invalid input(s): PCO2, PO2  Studies/Results: No results found.  Anti-infectives: Anti-infectives (From admission, onward)   Start     Dose/Rate Route Frequency Ordered Stop   02/15/20 2200  cefoTEtan (CEFOTAN) 2 g in sodium chloride 0.9 % 100 mL IVPB        2 g 200 mL/hr over 30 Minutes Intravenous Every 12 hours 02/15/20 1959 02/15/20 2322   02/15/20 1215  cefoTEtan (CEFOTAN) 2 g in sodium chloride 0.9 % 100 mL IVPB        2 g 200 mL/hr over 30 Minutes Intravenous On call to O.R. 02/15/20 1212 02/15/20 1535      Assessment/Plan: s/p Procedure(s): ROBOTIC LOW ANTERIOR RESECTION, DIVERTING ILEOSTOMY (N/A)   Continuing current care Ostomy training in progress Continue to ambulate Repeat labs in the morning  LOS: 2 days    Coralie Keens 02/17/2020

## 2020-02-18 LAB — BASIC METABOLIC PANEL
Anion gap: 10 (ref 5–15)
BUN: 15 mg/dL (ref 8–23)
CO2: 25 mmol/L (ref 22–32)
Calcium: 10.5 mg/dL — ABNORMAL HIGH (ref 8.9–10.3)
Chloride: 100 mmol/L (ref 98–111)
Creatinine, Ser: 0.89 mg/dL (ref 0.44–1.00)
GFR calc Af Amer: >60 mL/min (ref >60)
GFR calc non Af Amer: >60 mL/min (ref >60)
Glucose, Bld: 116 mg/dL — ABNORMAL HIGH (ref 70–99)
Potassium: 3.6 mmol/L (ref 3.5–5.1)
Sodium: 135 mmol/L (ref 135–145)

## 2020-02-18 LAB — CBC
HCT: 40.8 % (ref 36.0–46.0)
Hemoglobin: 13.7 g/dL (ref 12.0–15.0)
MCH: 29.3 pg (ref 26.0–34.0)
MCHC: 33.6 g/dL (ref 30.0–36.0)
MCV: 87.4 fL (ref 80.0–100.0)
Platelets: 319 10*3/uL (ref 150–400)
RBC: 4.67 MIL/uL (ref 3.87–5.11)
RDW: 13 % (ref 11.5–15.5)
WBC: 15.2 10*3/uL — ABNORMAL HIGH (ref 4.0–10.5)
nRBC: 0 % (ref 0.0–0.2)

## 2020-02-18 NOTE — Plan of Care (Signed)
  Problem: Pain Managment: Goal: General experience of comfort will improve Outcome: Progressing   

## 2020-02-18 NOTE — Progress Notes (Signed)
3 Days Post-Op   Subjective/Chief Complaint: Feels better today Having a lot of ostomy output Tolerating po   Objective: Vital signs in last 24 hours: Temp:  [98.1 F (36.7 C)-98.4 F (36.9 C)] 98.1 F (36.7 C) (07/11 0544) Pulse Rate:  [61-68] 64 (07/11 0544) Resp:  [15-18] 18 (07/11 0544) BP: (134-141)/(75-87) 140/78 (07/11 0544) SpO2:  [92 %-96 %] 95 % (07/11 0544) Weight:  [93.1 kg] 93.1 kg (07/11 0610) Last BM Date: 02/17/20  Intake/Output from previous day: 07/10 0701 - 07/11 0700 In: 1220 [P.O.:1220] Out: 5600 [Urine:3000; Drains:625; TIRWE:3154] Intake/Output this shift: No intake/output data recorded.  Exam: Awake and alert Oozing from upper port site.  Pressure dressing applied Abdomen soft, ostomy pink Lab Results:  Recent Labs    02/17/20 0517 02/18/20 0453  WBC 15.1* 15.2*  HGB 12.2 13.7  HCT 36.7 40.8  PLT 301 319   BMET Recent Labs    02/17/20 0517 02/18/20 0453  NA 134* 135  K 3.8 3.6  CL 104 100  CO2 24 25  GLUCOSE 115* 116*  BUN 13 15  CREATININE 0.82 0.89  CALCIUM 9.7 10.5*   PT/INR No results for input(s): LABPROT, INR in the last 72 hours. ABG No results for input(s): PHART, HCO3 in the last 72 hours.  Invalid input(s): PCO2, PO2  Studies/Results: No results found.  Anti-infectives: Anti-infectives (From admission, onward)   Start     Dose/Rate Route Frequency Ordered Stop   02/15/20 2200  cefoTEtan (CEFOTAN) 2 g in sodium chloride 0.9 % 100 mL IVPB        2 g 200 mL/hr over 30 Minutes Intravenous Every 12 hours 02/15/20 1959 02/15/20 2322   02/15/20 1215  cefoTEtan (CEFOTAN) 2 g in sodium chloride 0.9 % 100 mL IVPB        2 g 200 mL/hr over 30 Minutes Intravenous On call to O.R. 02/15/20 1212 02/15/20 1535      Assessment/Plan: s/p Procedure(s): ROBOTIC LOW ANTERIOR RESECTION, DIVERTING ILEOSTOMY (N/A)  Continue current care Ostomy training Advance diet  LOS: 3 days    Coralie Keens 02/18/2020

## 2020-02-19 ENCOUNTER — Inpatient Hospital Stay (HOSPITAL_COMMUNITY): Payer: Medicare Other

## 2020-02-19 MED ORDER — SODIUM CHLORIDE 0.9 % IV SOLN
12.5000 mg | Freq: Four times a day (QID) | INTRAVENOUS | Status: DC | PRN
  Administered 2020-02-23 – 2020-02-25 (×2): 12.5 mg via INTRAVENOUS
  Filled 2020-02-19 (×5): qty 0.5

## 2020-02-19 MED ORDER — SODIUM CHLORIDE 0.9 % IV SOLN
INTRAVENOUS | Status: DC
  Administered 2020-02-23: 75 mL/h via INTRAVENOUS

## 2020-02-19 NOTE — Care Management Important Message (Signed)
Important Message  Patient Details IM Letter given to [place] Case Manager to present to the Patient Name: Laura Mcdonald MRN: [id number] Date of Birth: May 02, 1956   Medicare Important Message Given:  Yes     Kerin Salen 02/19/2020, 12:34 PM

## 2020-02-19 NOTE — Progress Notes (Signed)
4 Days Post-Op Robotic LAR and diverting ileostomy Subjective: Had some increased pain this am, less ostomy output.  Pain is crampy in nature  Objective: Vital signs in last 24 hours: Temp:  [97.6 F (36.4 C)-98.2 F (36.8 C)] 98.1 F (36.7 C) (07/12 0556) Pulse Rate:  [62-72] 72 (07/12 0556) Resp:  [16-19] 16 (07/12 0556) BP: (126-139)/(79-87) 126/87 (07/12 0556) SpO2:  [90 %-96 %] 93 % (07/12 0556) Weight:  [93.1 kg] 93.1 kg (07/12 0556)   Intake/Output from previous day: 07/11 0701 - 07/12 0700 In: 430 [P.O.:430] Out: 2015 [Urine:1150; Drains:240; Stool:625] Intake/Output this shift: Total I/O In: 30 [P.O.:30] Out: -    General appearance: alert and cooperative GI: normal findings: soft, non-tender Ostomy beefy red Incision: no significant drainage  Lab Results:  Recent Labs    02/17/20 0517 02/18/20 0453  WBC 15.1* 15.2*  HGB 12.2 13.7  HCT 36.7 40.8  PLT 301 319   BMET Recent Labs    02/17/20 0517 02/18/20 0453  NA 134* 135  K 3.8 3.6  CL 104 100  CO2 24 25  GLUCOSE 115* 116*  BUN 13 15  CREATININE 0.82 0.89  CALCIUM 9.7 10.5*   PT/INR No results for input(s): LABPROT, INR in the last 72 hours. ABG No results for input(s): PHART, HCO3 in the last 72 hours.  Invalid input(s): PCO2, PO2  MEDS, Scheduled . acetaminophen  1,000 mg Oral Q6H  . Chlorhexidine Gluconate Cloth  6 each Topical Daily  . enoxaparin (LOVENOX) injection  40 mg Subcutaneous Q24H  . feeding supplement  237 mL Oral BID BM  . gabapentin  300 mg Oral BID  . saccharomyces boulardii  250 mg Oral BID    Studies/Results: No results found.  Assessment: s/p Procedure(s): ROBOTIC LOW ANTERIOR RESECTION, DIVERTING ILEOSTOMY Patient Active Problem List   Diagnosis Date Noted  . Rectal cancer ([place]) 01/22/2020  . Rectal polyp 12/31/2019  . Abnormal colonoscopy 12/31/2019  . Mild intermittent asthma   . Cervical cancer ([place])   . KNEE PAIN, RIGHT 03/13/2008  . ALLERGIC  RHINITIS 04/29/2007  . GERD 04/29/2007    Expected post op ileus  Plan: NPO today Ambulate Increase IV fluids Recheck labs in AM Ostomy teaching   LOS: 4 days     .Rosario Adie, MD [facility] Surgery, [place]    02/19/2020 10:20 AM

## 2020-02-19 NOTE — Telephone Encounter (Signed)
Agree, thanks

## 2020-02-19 NOTE — Telephone Encounter (Signed)
Patient is already undergone robotic LAR with end ileostomy. No need for follow-up at this time. FYI Dr. Silverio Decamp about your patient. Thanks. GM

## 2020-02-20 ENCOUNTER — Inpatient Hospital Stay (HOSPITAL_COMMUNITY): Payer: Medicare Other

## 2020-02-20 LAB — CBC
HCT: 41.7 % (ref 36.0–46.0)
Hemoglobin: 14.4 g/dL (ref 12.0–15.0)
MCH: 29.1 pg (ref 26.0–34.0)
MCHC: 34.5 g/dL (ref 30.0–36.0)
MCV: 84.2 fL (ref 80.0–100.0)
Platelets: 378 10*3/uL (ref 150–400)
RBC: 4.95 MIL/uL (ref 3.87–5.11)
RDW: 13.1 % (ref 11.5–15.5)
WBC: 17.3 10*3/uL — ABNORMAL HIGH (ref 4.0–10.5)
nRBC: 0 % (ref 0.0–0.2)

## 2020-02-20 LAB — BASIC METABOLIC PANEL
Anion gap: 12 (ref 5–15)
BUN: 17 mg/dL (ref 8–23)
CO2: 26 mmol/L (ref 22–32)
Calcium: 9.5 mg/dL (ref 8.9–10.3)
Chloride: 97 mmol/L — ABNORMAL LOW (ref 98–111)
Creatinine, Ser: 0.9 mg/dL (ref 0.44–1.00)
GFR calc Af Amer: >60 mL/min (ref >60)
GFR calc non Af Amer: >60 mL/min (ref >60)
Glucose, Bld: 102 mg/dL — ABNORMAL HIGH (ref 70–99)
Potassium: 3.6 mmol/L (ref 3.5–5.1)
Sodium: 135 mmol/L (ref 135–145)

## 2020-02-20 MED ORDER — PHENOL 1.4 % MT LIQD
1.0000 | OROMUCOSAL | Status: DC | PRN
  Filled 2020-02-20: qty 177

## 2020-02-20 NOTE — Progress Notes (Signed)
5 Days Post-Op Robotic LAR and diverting ileostomy Subjective: Had NG inserted, abd feels better.  Min ostomy output  Objective: Vital signs in last 24 hours: Temp:  [98 F (36.7 C)-98.8 F (37.1 C)] 98.8 F (37.1 C) (07/13 4287) Pulse Rate:  [71-82] 82 (07/13 0638) Resp:  [16-21] 16 (07/13 6811) BP: (145-162)/(81-100) 145/82 (07/13 0638) SpO2:  [95 %-97 %] 95 % (07/13 5726) Weight:  [95.8 kg] 95.8 kg (07/13 0642)   Intake/Output from previous day: 07/12 0701 - 07/13 0700 In: 1806.1 [P.O.:30; I.V.:1776.1] Out: 2240 [Emesis/NG output:2100; Drains:140] Intake/Output this shift: Total I/O In: -  Out: 400 [Urine:400]   General appearance: alert and cooperative GI: normal findings: soft, non-tender Ostomy beefy red Incision: no significant drainage  Lab Results:  Recent Labs    02/18/20 0453 02/20/20 0505  WBC 15.2* 17.3*  HGB 13.7 14.4  HCT 40.8 41.7  PLT 319 378   BMET Recent Labs    02/18/20 0453 02/20/20 0505  NA 135 135  K 3.6 3.6  CL 100 97*  CO2 25 26  GLUCOSE 116* 102*  BUN 15 17  CREATININE 0.89 0.90  CALCIUM 10.5* 9.5   PT/INR No results for input(s): LABPROT, INR in the last 72 hours. ABG No results for input(s): PHART, HCO3 in the last 72 hours.  Invalid input(s): PCO2, PO2  MEDS, Scheduled  acetaminophen  1,000 mg Oral Q6H   Chlorhexidine Gluconate Cloth  6 each Topical Daily   enoxaparin (LOVENOX) injection  40 mg Subcutaneous Q24H   feeding supplement  237 mL Oral BID BM   gabapentin  300 mg Oral BID   saccharomyces boulardii  250 mg Oral BID    Studies/Results: DG Abd 1 View  Result Date: 02/19/2020 CLINICAL DATA:  Four days post robotic low anterior resection and diverting ileostomy. NG tube placement. EXAM: ABDOMEN - 1 VIEW COMPARISON:  None. FINDINGS: Tip and side port of the enteric tube below the diaphragm in the stomach. Dilated small bowel in the right abdomen up to 3.6 cm. There is gaseous gastric distension. Minor  atelectasis at the left lung base. Surgical drain in the pelvis is partially included. IMPRESSION: 1. Tip and side port of the enteric tube below the diaphragm in the stomach. 2. Dilated small bowel in the right abdomen partially included in the field of view, suspected postoperative ileus. Electronically Signed   By: Keith Rake M.D.   On: 02/19/2020 20:10    Assessment: s/p Procedure(s): ROBOTIC LOW ANTERIOR RESECTION, DIVERTING ILEOSTOMY Patient Active Problem List   Diagnosis Date Noted   Rectal cancer ([place]) 01/22/2020   Rectal polyp 12/31/2019   Abnormal colonoscopy 12/31/2019   Mild intermittent asthma    Cervical cancer ([place])    KNEE PAIN, RIGHT 03/13/2008   ALLERGIC RHINITIS 04/29/2007   GERD 04/29/2007    Expected post op ileus  Plan: NPO today, cont NG Ambulate in hall Cont IV fluids Recheck labs in AM Ostomy teaching   LOS: 5 days     .Rosario Adie, MD [facility] Surgery, [place]    02/20/2020 8:40 AM

## 2020-02-20 NOTE — Consult Note (Signed)
[place] Nurse ostomy follow up Stoma type/location: RLQ loop ileostomy. Red rubber catheter bridge in place. Daughter visiting with patient and highly participatory in visit. Patient with NGT and unable to fully participate although she is engaged and listening. Stomal assessment/size: 1 and 5/8 inches oblique Peristomal assessment: intact Treatment options for stomal/peristomal skin: skin barrier ring Output: minimal Ostomy pouching: 2pc. 2 and 3/4 inch pouching system with skin barrier ring  Education provided:   Explained stoma characteristics (budded, flush, color, texture, care) Demonstrated pouch change (cutting new skin barrier, measuring stoma, cleaning peristomal skin and stoma, use of barrier ring) Instructed on use of toilet paper to clean tail closure of pouch  Discussed stoma decreasing in size as edema decreases and need to adjust aperture of skin barrier to fit closely. Answered patient/family questions.    Enrolled patient in [place] Discharge program: Yes, previously  [place] Nurse will remove red rubber catheter rod with next pouch change or as surgeon (Dr. Marcello Moores) desires.  Please place order when appropriate.  [place] nursing team will follow, and will remain available to this patient, the nursing, surgical and medical teams.    Thank you, Maudie Flakes, MSN, RN, [place], [facility]ther Abbott  Pager# [phone]

## 2020-02-21 LAB — BASIC METABOLIC PANEL
Anion gap: 8 (ref 5–15)
BUN: 18 mg/dL (ref 8–23)
CO2: 26 mmol/L (ref 22–32)
Calcium: 9.2 mg/dL (ref 8.9–10.3)
Chloride: 105 mmol/L (ref 98–111)
Creatinine, Ser: 0.87 mg/dL (ref 0.44–1.00)
GFR calc Af Amer: >60 mL/min (ref >60)
GFR calc non Af Amer: >60 mL/min (ref >60)
Glucose, Bld: 92 mg/dL (ref 70–99)
Potassium: 3.8 mmol/L (ref 3.5–5.1)
Sodium: 139 mmol/L (ref 135–145)

## 2020-02-21 LAB — CBC
HCT: 39.7 % (ref 36.0–46.0)
Hemoglobin: 13.1 g/dL (ref 12.0–15.0)
MCH: 28.9 pg (ref 26.0–34.0)
MCHC: 33 g/dL (ref 30.0–36.0)
MCV: 87.6 fL (ref 80.0–100.0)
Platelets: 365 10*3/uL (ref 150–400)
RBC: 4.53 MIL/uL (ref 3.87–5.11)
RDW: 13.3 % (ref 11.5–15.5)
WBC: 13.2 10*3/uL — ABNORMAL HIGH (ref 4.0–10.5)
nRBC: 0 % (ref 0.0–0.2)

## 2020-02-21 NOTE — Progress Notes (Signed)
6 Days Post-Op Robotic LAR and diverting ileostomy Subjective: NG working well, abd feels better.  Some air in ostomy Objective: Vital signs in last 24 hours: Temp:  [98.4 F (36.9 C)-98.7 F (37.1 C)] 98.4 F (36.9 C) (07/14 0709) Pulse Rate:  [68-81] 68 (07/14 0709) Resp:  [18] 18 (07/14 0709) BP: (127-149)/(66-84) 149/84 (07/14 0709) SpO2:  [93 %-94 %] 93 % (07/14 0709)   Intake/Output from previous day: 07/13 0701 - 07/14 0700 In: 2349.3 [I.V.:2319.3] Out: 4210 [Urine:2200; Emesis/NG output:1850; Drains:160] Intake/Output this shift: No intake/output data recorded.   General appearance: alert and cooperative GI: normal findings: soft, non-tender Ostomy beefy red, air in bag Incision: no significant drainage  Lab Results:  Recent Labs    02/20/20 0505 02/21/20 0525  WBC 17.3* 13.2*  HGB 14.4 13.1  HCT 41.7 39.7  PLT 378 365   BMET Recent Labs    02/20/20 0505 02/21/20 0525  NA 135 139  K 3.6 3.8  CL 97* 105  CO2 26 26  GLUCOSE 102* 92  BUN 17 18  CREATININE 0.90 0.87  CALCIUM 9.5 9.2   PT/INR No results for input(s): LABPROT, INR in the last 72 hours. ABG No results for input(s): PHART, HCO3 in the last 72 hours.  Invalid input(s): PCO2, PO2  MEDS, Scheduled . acetaminophen  1,000 mg Oral Q6H  . Chlorhexidine Gluconate Cloth  6 each Topical Daily  . enoxaparin (LOVENOX) injection  40 mg Subcutaneous Q24H  . feeding supplement  237 mL Oral BID BM  . gabapentin  300 mg Oral BID  . saccharomyces boulardii  250 mg Oral BID    Studies/Results: DG Abd 1 View  Result Date: 02/19/2020 CLINICAL DATA:  Four days post robotic low anterior resection and diverting ileostomy. NG tube placement. EXAM: ABDOMEN - 1 VIEW COMPARISON:  None. FINDINGS: Tip and side port of the enteric tube below the diaphragm in the stomach. Dilated small bowel in the right abdomen up to 3.6 cm. There is gaseous gastric distension. Minor atelectasis at the left lung base. Surgical  drain in the pelvis is partially included. IMPRESSION: 1. Tip and side port of the enteric tube below the diaphragm in the stomach. 2. Dilated small bowel in the right abdomen partially included in the field of view, suspected postoperative ileus. Electronically Signed   By: Keith Rake M.D.   On: 02/19/2020 20:10    Assessment: s/p Procedure(s): ROBOTIC LOW ANTERIOR RESECTION, DIVERTING ILEOSTOMY Patient Active Problem List   Diagnosis Date Noted  . Rectal cancer ([place]) 01/22/2020  . Rectal polyp 12/31/2019  . Abnormal colonoscopy 12/31/2019  . Mild intermittent asthma   . Cervical cancer ([place])   . KNEE PAIN, RIGHT 03/13/2008  . ALLERGIC RHINITIS 04/29/2007  . GERD 04/29/2007    Expected post op ileus  Plan: NPO today, cont NG, ok for ice chips and pop sickles Ambulate in hall Cont IV fluids, will decrease as pt is diuresing  Cont ostomy teaching.  Ok to remove RR on next pouch change   LOS: 6 days     .Rosario Adie, [facility], [place]    02/21/2020 9:31 AM

## 2020-02-22 ENCOUNTER — Telehealth: Payer: Self-pay | Admitting: Hematology

## 2020-02-22 ENCOUNTER — Encounter: Payer: Medicare Other | Admitting: Genetic Counselor

## 2020-02-22 ENCOUNTER — Other Ambulatory Visit: Payer: Medicare Other

## 2020-02-22 NOTE — Care Management Important Message (Signed)
Important Message  Patient Details IM Letter given to Lennart Pall SW Case Manager to present to the Patient Name: Era Parr MRN: [id number] Date of Birth: [birth year]   Medicare Important Message Given:  Yes     Kerin Salen 02/22/2020, 12:49 PM

## 2020-02-22 NOTE — Progress Notes (Signed)
7 Days Post-Op Robotic LAR and diverting ileostomy Subjective: abd feels better.  Some air and stool in ostomy Objective: Vital signs in last 24 hours: Temp:  [98.3 F (36.8 C)-98.5 F (36.9 C)] 98.4 F (36.9 C) (07/15 0612) Pulse Rate:  [69-79] 69 (07/15 0612) Resp:  [16-18] 18 (07/15 0612) BP: (133-159)/(69-92) 159/92 (07/15 0612) SpO2:  [93 %-95 %] 93 % (07/15 0612)   Intake/Output from previous day: 07/14 0701 - 07/15 0700 In: 1614.9 [P.O.:660; I.V.:954.9] Out: 3645 [Urine:1500; Emesis/NG output:1600; Drains:245; Stool:300] Intake/Output this shift: No intake/output data recorded.   General appearance: alert and cooperative GI: normal findings: soft, non-tender Ostomy beefy red, air in bag Incision: no significant drainage  Lab Results:  Recent Labs    02/20/20 0505 02/21/20 0525  WBC 17.3* 13.2*  HGB 14.4 13.1  HCT 41.7 39.7  PLT 378 365   BMET Recent Labs    02/20/20 0505 02/21/20 0525  NA 135 139  K 3.6 3.8  CL 97* 105  CO2 26 26  GLUCOSE 102* 92  BUN 17 18  CREATININE 0.90 0.87  CALCIUM 9.5 9.2   PT/INR No results for input(s): LABPROT, INR in the last 72 hours. ABG No results for input(s): PHART, HCO3 in the last 72 hours.  Invalid input(s): PCO2, PO2  MEDS, Scheduled . acetaminophen  1,000 mg Oral Q6H  . Chlorhexidine Gluconate Cloth  6 each Topical Daily  . enoxaparin (LOVENOX) injection  40 mg Subcutaneous Q24H  . feeding supplement  237 mL Oral BID BM  . gabapentin  300 mg Oral BID  . saccharomyces boulardii  250 mg Oral BID    Studies/Results: No results found.  Assessment: s/p Procedure(s): ROBOTIC LOW ANTERIOR RESECTION, DIVERTING ILEOSTOMY Patient Active Problem List   Diagnosis Date Noted  . Rectal cancer ([place]) 01/22/2020  . Rectal polyp 12/31/2019  . Abnormal colonoscopy 12/31/2019  . Mild intermittent asthma   . Cervical cancer ([place])   . KNEE PAIN, RIGHT 03/13/2008  . ALLERGIC RHINITIS 04/29/2007  . GERD 04/29/2007     Expected post op ileus  Plan: D/c NG, ok for clears Ambulate in hall Cont IV fluids, will decrease as pt is diuresing  Cont ostomy teaching.  Ok to remove RR on next pouch change   LOS: 7 days     .Rosario Adie, [facility], [place]    02/22/2020 8:24 AM

## 2020-02-22 NOTE — Telephone Encounter (Signed)
R/s appt per 7/14 staff message from [place] and scheduled appt per 7/13 sch message - left message for patient with appt date and time

## 2020-02-23 NOTE — Progress Notes (Signed)
8 Days Post-Op Robotic LAR and diverting ileostomy Subjective: abd feels better.  Tolerating clears.  Good ostomy output, having some crampy pain Objective: Vital signs in last 24 hours: Temp:  [98.4 F (36.9 C)-98.7 F (37.1 C)] 98.4 F (36.9 C) (07/16 0612) Pulse Rate:  [61-77] 61 (07/16 0612) Resp:  [16-18] 16 (07/16 0612) BP: (136-155)/(81-90) 136/83 (07/16 0612) SpO2:  [94 %-99 %] 94 % (07/16 0612) Weight:  [88 kg] 88 kg (07/16 0500)   Intake/Output from previous day: 07/15 0701 - 07/16 0700 In: 3543.9 [P.O.:900; I.V.:2643.9] Out: 2840 [Urine:1800; Drains:300; Stool:740] Intake/Output this shift: Total I/O In: 240 [P.O.:240] Out: 675 [Urine:500; Drains:50; Stool:125]   General appearance: alert and cooperative GI: normal findings: soft, non-tender Ostomy beefy red, air in bag, RR catheter removed Incision: no significant drainage  Lab Results:  Recent Labs    02/21/20 0525  WBC 13.2*  HGB 13.1  HCT 39.7  PLT 365   BMET Recent Labs    02/21/20 0525  NA 139  K 3.8  CL 105  CO2 26  GLUCOSE 92  BUN 18  CREATININE 0.87  CALCIUM 9.2   PT/INR No results for input(s): LABPROT, INR in the last 72 hours. ABG No results for input(s): PHART, HCO3 in the last 72 hours.  Invalid input(s): PCO2, PO2  MEDS, Scheduled . Chlorhexidine Gluconate Cloth  6 each Topical Daily  . enoxaparin (LOVENOX) injection  40 mg Subcutaneous Q24H  . feeding supplement  237 mL Oral BID BM  . gabapentin  300 mg Oral BID  . saccharomyces boulardii  250 mg Oral BID    Studies/Results: No results found.  Assessment: s/p Procedure(s): ROBOTIC LOW ANTERIOR RESECTION, DIVERTING ILEOSTOMY Patient Active Problem List   Diagnosis Date Noted  . Rectal cancer ([place]) 01/22/2020  . Rectal polyp 12/31/2019  . Abnormal colonoscopy 12/31/2019  . Mild intermittent asthma   . Cervical cancer ([place])   . KNEE PAIN, RIGHT 03/13/2008  . ALLERGIC RHINITIS 04/29/2007  . GERD 04/29/2007     Expected post op ileus  Plan: Advance to soft diet RR catheter removed, hopefully will help with her crampy pain Monitor ostomy output over next 24-48h.  May need imodium if outputs are >1L a day Encourage PO fluid intake, prefer non-caffeinated beverages over water for electrolyte replacement Ambulate in hall Good UOP, will saline lock Cont ostomy teaching.     LOS: 8 days     .Rosario Adie, MD [facility], [place]    02/23/2020 10:01 AM

## 2020-02-23 NOTE — Discharge Instructions (Signed)

## 2020-02-23 NOTE — Consult Note (Signed)
[place] Nurse ostomy follow up Stoma type/location: RLQ loop ileostomy Stomal assessment/size: 1 and 5/8 inches, slightly oval shaped Peristomal assessment: intact Treatment options for stomal/peristomal skin: skin barrier ring Output: liquid green Ostomy pouching: 2pc. 2 and 3/4 inch pouching system with skin barrier ring  Education provided:   Demonstrated pouch change (cutting new skin barrier; pt cut new skin barrier;  measuring stoma, patient assisted with cleaning peristomal skin and stoma, patient placed barrier ring on the back of the skin barrier) WOC enlarged to fit stomal size  Discussed cleaning spout with toilet paper wick or wipes    Patient self reports assisting with emptying of pouch  Enrolled patient in [employer] Discharge program: Yes, previously   [place] Nurse will follow along with you for continued support with ostomy teaching and care [place] MSN, [place], [place], [place], [place]

## 2020-02-24 ENCOUNTER — Inpatient Hospital Stay (HOSPITAL_COMMUNITY): Payer: Medicare Other

## 2020-02-24 LAB — CBC
HCT: 40.9 % (ref 36.0–46.0)
Hemoglobin: 13.6 g/dL (ref 12.0–15.0)
MCH: 29.1 pg (ref 26.0–34.0)
MCHC: 33.3 g/dL (ref 30.0–36.0)
MCV: 87.4 fL (ref 80.0–100.0)
Platelets: 394 10*3/uL (ref 150–400)
RBC: 4.68 MIL/uL (ref 3.87–5.11)
RDW: 13.1 % (ref 11.5–15.5)
WBC: 13 10*3/uL — ABNORMAL HIGH (ref 4.0–10.5)
nRBC: 0 % (ref 0.0–0.2)

## 2020-02-24 MED ORDER — IOHEXOL 350 MG/ML SOLN
100.0000 mL | Freq: Once | INTRAVENOUS | Status: AC | PRN
  Administered 2020-02-24: 100 mL via INTRAVENOUS

## 2020-02-24 MED ORDER — IOHEXOL 9 MG/ML PO SOLN
ORAL | Status: AC
  Administered 2020-02-24: 500 mL
  Filled 2020-02-24: qty 1000

## 2020-02-24 MED ORDER — DEXTROSE-NACL 5-0.9 % IV SOLN: INTRAVENOUS | Status: DC

## 2020-02-24 MED ORDER — SODIUM CHLORIDE (PF) 0.9 % IJ SOLN
INTRAMUSCULAR | Status: AC
  Filled 2020-02-24: qty 50

## 2020-02-24 NOTE — Progress Notes (Signed)
9 Days Post-Op   Subjective/Chief Complaint: Patient complaining of right upper quadrant abdominal pain and right lower chest pain.  No hypoxemia.  Pain is sharp in nature and came on last night with associated nausea and vomiting.  Ostomy is functioning.   Objective: Vital signs in last 24 hours: Temp:  [97.3 F (36.3 C)-98.3 F (36.8 C)] 97.3 F (36.3 C) (07/17 2426) Pulse Rate:  [60-65] 65 (07/17 0835) Resp:  [18] 18 (07/17 0613) BP: (129-152)/(80-89) 150/86 (07/17 0835) SpO2:  [93 %-98 %] 98 % (07/17 0835) Weight:  [88.4 kg] 88.4 kg (07/17 0835) Last BM Date: 02/23/20  Intake/Output from previous day: 07/16 0701 - 07/17 0700 In: 1210 [P.O.:1160; I.V.:50] Out: 3100 [Urine:2600; Drains:50; Stool:450] Intake/Output this shift: No intake/output data recorded.  General appearance: alert and cooperative Resp: clear to auscultation bilaterally Cardio: regular rate and rhythm, S1, S2 normal, no murmur, click, rub or gallop Incision/Wound: Ostomy pink and viable.  Incisions clean dry intact.  No peritonitis.  Tender right upper quadrant of the right costal margin.  No significant pain with deep inspiration.  Lab Results:  No results for input(s): WBC, HGB, HCT, PLT in the last 72 hours. BMET No results for input(s): NA, K, CL, CO2, GLUCOSE, BUN, CREATININE, CALCIUM in the last 72 hours. PT/INR No results for input(s): LABPROT, INR in the last 72 hours. ABG No results for input(s): PHART, HCO3 in the last 72 hours.  Invalid input(s): PCO2, PO2  Studies/Results: No results found.  Anti-infectives: Anti-infectives (From admission, onward)   Start     Dose/Rate Route Frequency Ordered Stop   02/15/20 2200  cefoTEtan (CEFOTAN) 2 g in sodium chloride 0.9 % 100 mL IVPB        2 g 200 mL/hr over 30 Minutes Intravenous Every 12 hours 02/15/20 1959 02/15/20 2322   02/15/20 1215  cefoTEtan (CEFOTAN) 2 g in sodium chloride 0.9 % 100 mL IVPB        2 g 200 mL/hr over 30 Minutes  Intravenous On call to O.R. 02/15/20 1212 02/15/20 1535      Assessment/Plan: s/p Procedure(s): ROBOTIC LOW ANTERIOR RESECTION, DIVERTING ILEOSTOMY (N/A)   Nausea vomiting right upper quadrant abdominal pain-this is a change her last night.  Will check abdominal pelvic CT scan also CT angio to exclude pulmonary embolus given right upper quadrant and right lower chest wall pain.  It does not appear to be worse with inspiration but will need to exclude PE.  Replace IV fluids and limit p.o. intake for now.  Recheck labs.  LOS: 9 days    Joyice Faster Lauran Romanski 02/24/2020

## 2020-02-24 NOTE — Progress Notes (Signed)
Patient got wobbly and almost fell backwards during walk to BR this morning. We put her to bed. She didn't realize she stumbled but admitted to feeling dizzy.

## 2020-02-25 LAB — COMPREHENSIVE METABOLIC PANEL
ALT: 27 U/L (ref 0–44)
AST: 24 U/L (ref 15–41)
Albumin: 3.4 g/dL — ABNORMAL LOW (ref 3.5–5.0)
Alkaline Phosphatase: 51 U/L (ref 38–126)
Anion gap: 11 (ref 5–15)
BUN: 11 mg/dL (ref 8–23)
CO2: 26 mmol/L (ref 22–32)
Calcium: 9.9 mg/dL (ref 8.9–10.3)
Chloride: 99 mmol/L (ref 98–111)
Creatinine, Ser: 0.74 mg/dL (ref 0.44–1.00)
GFR calc Af Amer: >60 mL/min (ref >60)
GFR calc non Af Amer: >60 mL/min (ref >60)
Glucose, Bld: 132 mg/dL — ABNORMAL HIGH (ref 70–99)
Potassium: 3.6 mmol/L (ref 3.5–5.1)
Sodium: 136 mmol/L (ref 135–145)
Total Bilirubin: 1.2 mg/dL (ref 0.3–1.2)
Total Protein: 7 g/dL (ref 6.5–8.1)

## 2020-02-25 MED ORDER — PROCHLORPERAZINE EDISYLATE 10 MG/2ML IJ SOLN
10.0000 mg | Freq: Four times a day (QID) | INTRAMUSCULAR | Status: DC | PRN
  Administered 2020-02-25: 10 mg via INTRAVENOUS
  Filled 2020-02-25: qty 2

## 2020-02-25 NOTE — Progress Notes (Signed)
Assessment & Plan: POD#10 -  Status post robotic LAR with diverting loop ileostomy  Limited output from ileostomy, persistent nausea and emesis  Consider replacement of NG tube - patient requests not to have replaced this AM  NPO, IV hydration  CT scan with parastomal hernia with loop of small intestine with obstruction - discussed with patient  May require return to OR for revision of loop ileostomy due to acute parastomal hernia with obstruction.  Will continue NPO, IV hydration, and will place NG if persistent nausea and emesis.  Discussed with patient.        Armandina Gemma, MD       [facility]       Office: [phone]   Chief Complaint: Rectal cancer  Subjective: Patient with nausea, emesis overnight.  Little better this AM.  Does not want NG tube replaced.  Objective: Vital signs in last 24 hours: Temp:  [97.9 F (36.6 C)-98.3 F (36.8 C)] 97.9 F (36.6 C) (07/18 0544) Pulse Rate:  [57-71] 70 (07/18 0544) Resp:  [17-18] 18 (07/18 0544) BP: (146-157)/(78-86) 146/86 (07/18 0544) SpO2:  [97 %-100 %] 97 % (07/18 0544) Last BM Date: 02/24/20  Intake/Output from previous day: 07/17 0701 - 07/18 0700 In: 1558.5 [P.O.:260; I.V.:1298.5] Out: 2450 [Urine:2100; Stool:350] Intake/Output this shift: No intake/output data recorded.  Physical Exam: HEENT - sclerae clear, mucous membranes moist Neck - soft Chest - clear bilaterally Cor - RRR Abdomen - distended; ostomy on right viable, small liquid in bag; incisions dry and intact Ext - no edema, non-tender Neuro - alert & oriented, no focal deficits  Lab Results:  Recent Labs    02/24/20 0939  WBC 13.0*  HGB 13.6  HCT 40.9  PLT 394   BMET Recent Labs    02/25/20 0610  NA 136  K 3.6  CL 99  CO2 26  GLUCOSE 132*  BUN 11  CREATININE 0.74  CALCIUM 9.9   PT/INR No results for input(s): LABPROT, INR in the last 72 hours. Comprehensive Metabolic Panel:    Component Value Date/Time     NA 136 02/25/2020 0610   NA 139 02/21/2020 0525   K 3.6 02/25/2020 0610   K 3.8 02/21/2020 0525   CL 99 02/25/2020 0610   CL 105 02/21/2020 0525   CO2 26 02/25/2020 0610   CO2 26 02/21/2020 0525   BUN 11 02/25/2020 0610   BUN 18 02/21/2020 0525   CREATININE 0.74 02/25/2020 0610   CREATININE 0.87 02/21/2020 0525   GLUCOSE 132 (H) 02/25/2020 0610   GLUCOSE 92 02/21/2020 0525   CALCIUM 9.9 02/25/2020 0610   CALCIUM 9.2 02/21/2020 0525   AST 24 02/25/2020 0610   ALT 27 02/25/2020 0610   ALKPHOS 51 02/25/2020 0610   BILITOT 1.2 02/25/2020 0610   PROT 7.0 02/25/2020 0610   ALBUMIN 3.4 (L) 02/25/2020 0610    Studies/Results: CT ANGIO CHEST PE W OR WO CONTRAST  Result Date: 02/24/2020 CLINICAL DATA:  Shortness of breath. Right upper quadrant abdominal pain. EXAM: CT ANGIOGRAPHY CHEST CT ABDOMEN AND PELVIS WITH CONTRAST TECHNIQUE: Multidetector CT imaging of the chest was performed using the standard protocol during bolus administration of intravenous contrast. Multiplanar CT image reconstructions and MIPs were obtained to evaluate the vascular anatomy. Multidetector CT imaging of the abdomen and pelvis was performed using the standard protocol during bolus administration of intravenous contrast. CONTRAST:  179mL OMNIPAQUE IOHEXOL 350 MG/ML SOLN COMPARISON:  January 24, 2020. FINDINGS: CTA CHEST  FINDINGS Cardiovascular: Satisfactory opacification of the pulmonary arteries to the segmental level. No evidence of pulmonary embolism. Normal heart size. No pericardial effusion. Mediastinum/Nodes: No enlarged mediastinal, hilar, or axillary lymph nodes. Thyroid gland, trachea, and esophagus demonstrate no significant findings. Lungs/Pleura: No pneumothorax or pleural effusion is noted. Minimal bilateral posterior basilar subsegmental atelectasis is noted. Stable 5 mm nodule is noted laterally in the right upper lobe best seen on image number 54 of series 8. Musculoskeletal: No chest wall abnormality. No  acute or significant osseous findings. Review of the MIP images confirms the above findings. CT ABDOMEN and PELVIS FINDINGS Hepatobiliary: Hepatic steatosis. No gallstones or biliary dilatation is noted. Pancreas: Unremarkable. No pancreatic ductal dilatation or surrounding inflammatory changes. Spleen: Stable low density is noted anteriorly in the spleen most consistent with benign cyst or hemangioma. No other splenic abnormality is noted. Adrenals/Urinary Tract: Adrenal glands appear normal. Several right renal cortical calcifications are noted. No hydronephrosis or renal obstruction is noted. Urinary bladder is unremarkable. Stomach/Bowel: Mild gastric distention is noted. Status post low anterior resection of the rectum with diverting ileostomy. There is severe proximal small bowel dilatation which appears to be due to small bowel loop extending into peristomal hernia in the right lower quadrant, resulting in small bowel obstruction. Postsurgical changes are seen involving the rectum. No colonic dilatation is noted. Vascular/Lymphatic: No significant vascular findings are present. No enlarged abdominal or pelvic lymph nodes. Reproductive: Uterus and bilateral adnexa are unremarkable. Other: There is again noted large lipoma within the left gluteus muscles. No ascites is noted. Musculoskeletal: No acute or significant osseous findings. Review of the MIP images confirms the above findings. IMPRESSION: 1. No definite evidence of pulmonary embolus. 2. Right lower quadrant diverting ileostomy is noted, with moderate-sized peristomal hernia. This contains a loop of small bowel which results in obstruction of the more proximal small bowel. 3. Hepatic steatosis. 4. Stable 5 mm nodule is noted laterally in the right upper lobe. No follow-up needed if patient is low-risk. Non-contrast chest CT can be considered in 12 months if patient is high-risk. This recommendation follows the consensus statement: Guidelines for  Management of Incidental Pulmonary Nodules Detected on CT Images: From the Fleischner Society 2017; Radiology 2017; 284:228-243. Electronically Signed   By: Marijo Conception M.D.   On: 02/24/2020 14:15   CT ABDOMEN PELVIS W CONTRAST  Result Date: 02/24/2020 CLINICAL DATA:  Shortness of breath. Right upper quadrant abdominal pain. EXAM: CT ANGIOGRAPHY CHEST CT ABDOMEN AND PELVIS WITH CONTRAST TECHNIQUE: Multidetector CT imaging of the chest was performed using the standard protocol during bolus administration of intravenous contrast. Multiplanar CT image reconstructions and MIPs were obtained to evaluate the vascular anatomy. Multidetector CT imaging of the abdomen and pelvis was performed using the standard protocol during bolus administration of intravenous contrast. CONTRAST:  157mL OMNIPAQUE IOHEXOL 350 MG/ML SOLN COMPARISON:  January 24, 2020. FINDINGS: CTA CHEST FINDINGS Cardiovascular: Satisfactory opacification of the pulmonary arteries to the segmental level. No evidence of pulmonary embolism. Normal heart size. No pericardial effusion. Mediastinum/Nodes: No enlarged mediastinal, hilar, or axillary lymph nodes. Thyroid gland, trachea, and esophagus demonstrate no significant findings. Lungs/Pleura: No pneumothorax or pleural effusion is noted. Minimal bilateral posterior basilar subsegmental atelectasis is noted. Stable 5 mm nodule is noted laterally in the right upper lobe best seen on image number 54 of series 8. Musculoskeletal: No chest wall abnormality. No acute or significant osseous findings. Review of the MIP images confirms the above findings. CT ABDOMEN and PELVIS FINDINGS  Hepatobiliary: Hepatic steatosis. No gallstones or biliary dilatation is noted. Pancreas: Unremarkable. No pancreatic ductal dilatation or surrounding inflammatory changes. Spleen: Stable low density is noted anteriorly in the spleen most consistent with benign cyst or hemangioma. No other splenic abnormality is noted.  Adrenals/Urinary Tract: Adrenal glands appear normal. Several right renal cortical calcifications are noted. No hydronephrosis or renal obstruction is noted. Urinary bladder is unremarkable. Stomach/Bowel: Mild gastric distention is noted. Status post low anterior resection of the rectum with diverting ileostomy. There is severe proximal small bowel dilatation which appears to be due to small bowel loop extending into peristomal hernia in the right lower quadrant, resulting in small bowel obstruction. Postsurgical changes are seen involving the rectum. No colonic dilatation is noted. Vascular/Lymphatic: No significant vascular findings are present. No enlarged abdominal or pelvic lymph nodes. Reproductive: Uterus and bilateral adnexa are unremarkable. Other: There is again noted large lipoma within the left gluteus muscles. No ascites is noted. Musculoskeletal: No acute or significant osseous findings. Review of the MIP images confirms the above findings. IMPRESSION: 1. No definite evidence of pulmonary embolus. 2. Right lower quadrant diverting ileostomy is noted, with moderate-sized peristomal hernia. This contains a loop of small bowel which results in obstruction of the more proximal small bowel. 3. Hepatic steatosis. 4. Stable 5 mm nodule is noted laterally in the right upper lobe. No follow-up needed if patient is low-risk. Non-contrast chest CT can be considered in 12 months if patient is high-risk. This recommendation follows the consensus statement: Guidelines for Management of Incidental Pulmonary Nodules Detected on CT Images: From the Fleischner Society 2017; Radiology 2017; 284:228-243. Electronically Signed   By: Marijo Conception M.D.   On: 02/24/2020 14:15      Armandina Gemma 02/25/2020  Patient ID: Laura Mcdonald, female   DOB: [birth year], 64 y.o.   MRN: [id number]

## 2020-02-26 NOTE — Consult Note (Signed)
WOC in to visit with patient. She request teaching visit with [place] and her daughter tomorrow at 10:15-10:30. Will plan to see patient for pouch change and education then.   [place], [place], [place]

## 2020-02-26 NOTE — Progress Notes (Signed)
11 Days Post-Op Robotic LAR and diverting ileostomy Subjective: Had an episode of n/v on Sat.  CT scan shows possible obstruction at ostomy level.  Pt was NPO yesterday and had significant ostomy output and feels much better  Objective: Vital signs in last 24 hours: Temp:  [98.1 F (36.7 C)-98.4 F (36.9 C)] 98.2 F (36.8 C) (07/19 0553) Pulse Rate:  [63-72] 70 (07/19 0553) Resp:  [15-16] 15 (07/19 0553) BP: (132-160)/(78-83) 146/80 (07/19 0553) SpO2:  [94 %-98 %] 97 % (07/19 0553) Weight:  [86.9 kg] 86.9 kg (07/19 0611)   Intake/Output from previous day: 07/18 0701 - 07/19 0700 In: 1874.7 [P.O.:10; I.V.:1864.7] Out: 4750 [Urine:1200; Stool:3550] Intake/Output this shift: No intake/output data recorded.   General appearance: alert and cooperative GI: normal findings: soft, non-tender Ostomy beefy red, air and stool in bag Incision: no significant drainage  Lab Results:  Recent Labs    02/24/20 0939  WBC 13.0*  HGB 13.6  HCT 40.9  PLT 394   BMET Recent Labs    02/25/20 0610  NA 136  K 3.6  CL 99  CO2 26  GLUCOSE 132*  BUN 11  CREATININE 0.74  CALCIUM 9.9   PT/INR No results for input(s): LABPROT, INR in the last 72 hours. ABG No results for input(s): PHART, HCO3 in the last 72 hours.  Invalid input(s): PCO2, PO2  MEDS, Scheduled  enoxaparin (LOVENOX) injection  40 mg Subcutaneous Q24H   feeding supplement  237 mL Oral BID BM   gabapentin  300 mg Oral BID   saccharomyces boulardii  250 mg Oral BID    Studies/Results: CT ANGIO CHEST PE W OR WO CONTRAST  Result Date: 02/24/2020 CLINICAL DATA:  Shortness of breath. Right upper quadrant abdominal pain. EXAM: CT ANGIOGRAPHY CHEST CT ABDOMEN AND PELVIS WITH CONTRAST TECHNIQUE: Multidetector CT imaging of the chest was performed using the standard protocol during bolus administration of intravenous contrast. Multiplanar CT image reconstructions and MIPs were obtained to evaluate the vascular anatomy.  Multidetector CT imaging of the abdomen and pelvis was performed using the standard protocol during bolus administration of intravenous contrast. CONTRAST:  15mL OMNIPAQUE IOHEXOL 350 MG/ML SOLN COMPARISON:  January 24, 2020. FINDINGS: CTA CHEST FINDINGS Cardiovascular: Satisfactory opacification of the pulmonary arteries to the segmental level. No evidence of pulmonary embolism. Normal heart size. No pericardial effusion. Mediastinum/Nodes: No enlarged mediastinal, hilar, or axillary lymph nodes. Thyroid gland, trachea, and esophagus demonstrate no significant findings. Lungs/Pleura: No pneumothorax or pleural effusion is noted. Minimal bilateral posterior basilar subsegmental atelectasis is noted. Stable 5 mm nodule is noted laterally in the right upper lobe best seen on image number 54 of series 8. Musculoskeletal: No chest wall abnormality. No acute or significant osseous findings. Review of the MIP images confirms the above findings. CT ABDOMEN and PELVIS FINDINGS Hepatobiliary: Hepatic steatosis. No gallstones or biliary dilatation is noted. Pancreas: Unremarkable. No pancreatic ductal dilatation or surrounding inflammatory changes. Spleen: Stable low density is noted anteriorly in the spleen most consistent with benign cyst or hemangioma. No other splenic abnormality is noted. Adrenals/Urinary Tract: Adrenal glands appear normal. Several right renal cortical calcifications are noted. No hydronephrosis or renal obstruction is noted. Urinary bladder is unremarkable. Stomach/Bowel: Mild gastric distention is noted. Status post low anterior resection of the rectum with diverting ileostomy. There is severe proximal small bowel dilatation which appears to be due to small bowel loop extending into peristomal hernia in the right lower quadrant, resulting in small bowel obstruction. Postsurgical changes are  seen involving the rectum. No colonic dilatation is noted. Vascular/Lymphatic: No significant vascular findings  are present. No enlarged abdominal or pelvic lymph nodes. Reproductive: Uterus and bilateral adnexa are unremarkable. Other: There is again noted large lipoma within the left gluteus muscles. No ascites is noted. Musculoskeletal: No acute or significant osseous findings. Review of the MIP images confirms the above findings. IMPRESSION: 1. No definite evidence of pulmonary embolus. 2. Right lower quadrant diverting ileostomy is noted, with moderate-sized peristomal hernia. This contains a loop of small bowel which results in obstruction of the more proximal small bowel. 3. Hepatic steatosis. 4. Stable 5 mm nodule is noted laterally in the right upper lobe. No follow-up needed if patient is low-risk. Non-contrast chest CT can be considered in 12 months if patient is high-risk. This recommendation follows the consensus statement: Guidelines for Management of Incidental Pulmonary Nodules Detected on CT Images: From the Fleischner Society 2017; Radiology 2017; 284:228-243. Electronically Signed   By: Marijo Conception M.D.   On: 02/24/2020 14:15   CT ABDOMEN PELVIS W CONTRAST  Result Date: 02/24/2020 CLINICAL DATA:  Shortness of breath. Right upper quadrant abdominal pain. EXAM: CT ANGIOGRAPHY CHEST CT ABDOMEN AND PELVIS WITH CONTRAST TECHNIQUE: Multidetector CT imaging of the chest was performed using the standard protocol during bolus administration of intravenous contrast. Multiplanar CT image reconstructions and MIPs were obtained to evaluate the vascular anatomy. Multidetector CT imaging of the abdomen and pelvis was performed using the standard protocol during bolus administration of intravenous contrast. CONTRAST:  169mL OMNIPAQUE IOHEXOL 350 MG/ML SOLN COMPARISON:  January 24, 2020. FINDINGS: CTA CHEST FINDINGS Cardiovascular: Satisfactory opacification of the pulmonary arteries to the segmental level. No evidence of pulmonary embolism. Normal heart size. No pericardial effusion. Mediastinum/Nodes: No enlarged  mediastinal, hilar, or axillary lymph nodes. Thyroid gland, trachea, and esophagus demonstrate no significant findings. Lungs/Pleura: No pneumothorax or pleural effusion is noted. Minimal bilateral posterior basilar subsegmental atelectasis is noted. Stable 5 mm nodule is noted laterally in the right upper lobe best seen on image number 54 of series 8. Musculoskeletal: No chest wall abnormality. No acute or significant osseous findings. Review of the MIP images confirms the above findings. CT ABDOMEN and PELVIS FINDINGS Hepatobiliary: Hepatic steatosis. No gallstones or biliary dilatation is noted. Pancreas: Unremarkable. No pancreatic ductal dilatation or surrounding inflammatory changes. Spleen: Stable low density is noted anteriorly in the spleen most consistent with benign cyst or hemangioma. No other splenic abnormality is noted. Adrenals/Urinary Tract: Adrenal glands appear normal. Several right renal cortical calcifications are noted. No hydronephrosis or renal obstruction is noted. Urinary bladder is unremarkable. Stomach/Bowel: Mild gastric distention is noted. Status post low anterior resection of the rectum with diverting ileostomy. There is severe proximal small bowel dilatation which appears to be due to small bowel loop extending into peristomal hernia in the right lower quadrant, resulting in small bowel obstruction. Postsurgical changes are seen involving the rectum. No colonic dilatation is noted. Vascular/Lymphatic: No significant vascular findings are present. No enlarged abdominal or pelvic lymph nodes. Reproductive: Uterus and bilateral adnexa are unremarkable. Other: There is again noted large lipoma within the left gluteus muscles. No ascites is noted. Musculoskeletal: No acute or significant osseous findings. Review of the MIP images confirms the above findings. IMPRESSION: 1. No definite evidence of pulmonary embolus. 2. Right lower quadrant diverting ileostomy is noted, with moderate-sized  peristomal hernia. This contains a loop of small bowel which results in obstruction of the more proximal small bowel. 3. Hepatic steatosis.  4. Stable 5 mm nodule is noted laterally in the right upper lobe. No follow-up needed if patient is low-risk. Non-contrast chest CT can be considered in 12 months if patient is high-risk. This recommendation follows the consensus statement: Guidelines for Management of Incidental Pulmonary Nodules Detected on CT Images: From the Fleischner Society 2017; Radiology 2017; 284:228-243. Electronically Signed   By: Marijo Conception M.D.   On: 02/24/2020 14:15    Assessment: s/p Procedure(s): ROBOTIC LOW ANTERIOR RESECTION, DIVERTING ILEOSTOMY Patient Active Problem List   Diagnosis Date Noted   Rectal cancer ([place]) 01/22/2020   Rectal polyp 12/31/2019   Abnormal colonoscopy 12/31/2019   Mild intermittent asthma    Cervical cancer ([place])    KNEE PAIN, RIGHT 03/13/2008   ALLERGIC RHINITIS 04/29/2007   GERD 04/29/2007    Expected post op ileus  Plan: Resume diet Ambulate in hall Cont IV fluids, will decrease as pt eats more Cont ostomy teaching.     LOS: 11 days     .Rosario Adie, [facility], [place]    02/26/2020 8:36 AM

## 2020-02-26 NOTE — Care Management Important Message (Signed)
Important Message  Patient Details IM Letter given to [place] Case Manager to present to the Patient Name: Laura Mcdonald MRN: [id number] Date of Birth: 10-18-1955   Medicare Important Message Given:  Yes     Kerin Salen 02/26/2020, 12:38 PM

## 2020-02-27 LAB — SURGICAL PATHOLOGY

## 2020-02-27 NOTE — Consult Note (Signed)
[place] Nurse ostomy follow up Stoma type/location: RLQ, end ileostomy Stomal assessment/size: 1 1/2" slightly oval shaped, pink, moist Peristomal assessment: small area of mucocutaneous separation at the site of the support rod (where it was formerly)  Treatment options for stomal/peristomal skin:  Output liquid green; emptied 50cc, recorded 500 from the bathroom graduates.  Ostomy pouching: 2pc. 2 3/4" with 2" barrier ring Education provided:  Patient performed pouch change with daughter at the bedside. She was able to perform pouch change all steps with very minimal cuing. Emptied old pouch independently, cleaned spout.  Enrolled patient in [place] Start Discharge program: Yes  [place] Nurse will follow along with you for continued support with ostomy teaching and care [place] MSN, [place], [place], [place], [place]

## 2020-02-27 NOTE — Progress Notes (Signed)
12 Days Post-Op Robotic LAR and diverting ileostomy Subjective: Pt tolerating a soft diet.  No further nausea.  Did have some intense pain yesterday  Objective: Vital signs in last 24 hours: Temp:  [98.1 F (36.7 C)-99.1 F (37.3 C)] 98.3 F (36.8 C) (07/20 0533) Pulse Rate:  [57-70] 57 (07/20 0533) Resp:  [18-19] 18 (07/20 0533) BP: (124-149)/(73-81) 124/73 (07/20 0533) SpO2:  [95 %-98 %] 97 % (07/20 0533)   Intake/Output from previous day: 07/19 0701 - 07/20 0700 In: 2736.2 [P.O.:360; I.V.:2376.2] Out: 3625 [Urine:1850; Stool:1775] Intake/Output this shift: No intake/output data recorded.   General appearance: alert and cooperative GI: normal findings: soft, non-tender Ostomy beefy red, air and stool in bag Incision: no significant drainage  Lab Results:  Recent Labs    02/24/20 0939  WBC 13.0*  HGB 13.6  HCT 40.9  PLT 394   BMET Recent Labs    02/25/20 0610  NA 136  K 3.6  CL 99  CO2 26  GLUCOSE 132*  BUN 11  CREATININE 0.74  CALCIUM 9.9   PT/INR No results for input(s): LABPROT, INR in the last 72 hours. ABG No results for input(s): PHART, HCO3 in the last 72 hours.  Invalid input(s): PCO2, PO2  MEDS, Scheduled . enoxaparin (LOVENOX) injection  40 mg Subcutaneous Q24H  . feeding supplement  237 mL Oral BID BM  . gabapentin  300 mg Oral BID  . saccharomyces boulardii  250 mg Oral BID    Studies/Results: No results found.  Assessment: s/p Procedure(s): ROBOTIC LOW ANTERIOR RESECTION, DIVERTING ILEOSTOMY Patient Active Problem List   Diagnosis Date Noted  . Rectal cancer ([place]) 01/22/2020  . Rectal polyp 12/31/2019  . Abnormal colonoscopy 12/31/2019  . Mild intermittent asthma   . Cervical cancer ([place])   . KNEE PAIN, RIGHT 03/13/2008  . ALLERGIC RHINITIS 04/29/2007  . GERD 04/29/2007    Expected post op ileus  Plan: Cont soft diet Ambulate in hall Saline lock IV Cont ostomy teaching.   Possible d/c tom depending on ostomy  output   LOS: 12 days     .Rosario Adie, MD [facility], [place]    02/27/2020 8:30 AM

## 2020-02-28 MED ORDER — LOPERAMIDE HCL 2 MG PO TABS
2.0000 mg | ORAL_TABLET | Freq: Every evening | ORAL | 0 refills | Status: DC
Start: 2020-02-28 — End: 2020-11-07

## 2020-02-28 MED ORDER — TRAMADOL HCL 50 MG PO TABS: 50.0000 mg | ORAL_TABLET | Freq: Four times a day (QID) | ORAL | 0 refills | Status: DC | PRN

## 2020-02-28 NOTE — Discharge Summary (Signed)
Patient ID: Laura Mcdonald [id number] 64 y.o. 01-May-1956  02/15/2020  Discharge date and time: 02/28/2020 10:05 AM  Admitting Physician: Rosario Adie  Discharge Physician: Rosario Adie  Admission Diagnoses: Rectal cancer [facility]) [place]  Discharge Diagnoses: Rectal Cancer  Operations: ROBOTIC LOW ANTERIOR RESECTION, DIVERTING ILEOSTOMY    Discharged Condition: good    Hospital Course: Patient was admitted to the hospital after robotic assisted low anterior resection and diverting ileostomy.  Her diet was advanced as tolerated.  She developed a postoperative ileus and her diet was stopped and she was made n.p.o.  An NG tube was placed.  Her ostomy began to put out after a couple days of NG decompression.  The NG was removed and her diet was advanced.  She did develop some nausea and vomiting after this and a CT scan was ordered.  This showed postoperative ileus with no other signs of infection or obstruction.  Her ostomy began to function and her diet was advanced.  When she was able to keep yourself hydrated without IV fluids, she was felt to be in stable condition for discharge to home.  She will continue to monitor her fluid status at home.  She was counseled on signs of dehydration and ways to combat this at home.  She will take an Imodium at night to help with her ostomy output.  Consults: None  Significant Diagnostic Studies: labs: cbc, bmet  Treatments: IV hydration, analgesia: acetaminophen and tramadol and surgery: Robotic assisted low anterior resection with diverting ileostomy  Disposition: Home

## 2020-02-28 NOTE — Plan of Care (Signed)
resolved 

## 2020-02-29 ENCOUNTER — Encounter (HOSPITAL_COMMUNITY): Payer: Self-pay

## 2020-03-14 ENCOUNTER — Other Ambulatory Visit: Payer: Medicare Other

## 2020-03-14 ENCOUNTER — Encounter: Payer: Medicare Other | Admitting: Genetic Counselor

## 2020-03-14 ENCOUNTER — Other Ambulatory Visit: Payer: Self-pay | Admitting: Genetic Counselor

## 2020-03-14 ENCOUNTER — Ambulatory Visit: Payer: Medicare Other | Admitting: Hematology

## 2020-03-14 DIAGNOSIS — C20 Malignant neoplasm of rectum: Secondary | ICD-10-CM

## 2020-03-14 NOTE — Progress Notes (Signed)
Attempted to reach patient regarding her missed appointments today with Dr. [facility].  I left my direct number and asked that she call me back to see if she is willing to reschedule.

## 2020-03-25 ENCOUNTER — Telehealth: Payer: Self-pay | Admitting: Hematology

## 2020-03-25 NOTE — Telephone Encounter (Signed)
Scheduled appt per 8/16 sch msg - unable to reach pt - left message for patient with apt date and time

## 2020-03-27 ENCOUNTER — Other Ambulatory Visit: Payer: Self-pay | Admitting: General Surgery

## 2020-03-27 DIAGNOSIS — C20 Malignant neoplasm of rectum: Secondary | ICD-10-CM

## 2020-03-28 ENCOUNTER — Ambulatory Visit: Payer: Self-pay | Admitting: General Surgery

## 2020-03-28 ENCOUNTER — Ambulatory Visit
Admission: RE | Admit: 2020-03-28 | Discharge: 2020-03-28 | Disposition: A | Discharge status: Home / Self Care | Payer: Medicare Other | Source: Ambulatory Visit | Attending: General Surgery | Admitting: General Surgery

## 2020-03-28 DIAGNOSIS — C20 Malignant neoplasm of rectum: Secondary | ICD-10-CM

## 2020-03-28 NOTE — H&P (View-Only) (Signed)
The patient is a 64 year old female who presents with colorectal cancer. 64 year old female who is here today for a newly diagnosed rectal cancer. This was diagnosed with screening colonoscopy. An endoscopic resection was attempted but was unable to lift. Biopsy shows tubular adenoma with high-grade dysplasia and invasive carcinoma could not be ruled out. The area is tattooed. CT scans of the chest, abdomen and pelvis show a small nodule in the right lung, which is thought to be inflammatory. There is a small lesion in the spleen which may require MRI or PET CT in the future. MRI shows a T1/T2 mass in the mid rectum approximately 6.8 cm from the internal anal sphincter. There is no lymph node involvement noted. Patient has a history of 2 ectopic pregnancies and is status post resection of one ovary and tube.  Pt is here for follow-up status post robotic LAR and diverting ileostomy for rectal cancer on 02/15/20. Discharged on 02/28/20. She states she has been doing very well since surgery. She empties her bag about 4-5 times a day. She describes the stool output as pasty. She denies nausea, vomiting, abdominal pain, fevers, or issues with urine. She is walking 2.5-3 miles a day. She states occasionally she feels a pressure in her lower abdomen when walking for long periods of time, but otherwise feels great. She does mention one episode of passing mucus per rectum associated with some abdominal cramping prior. Cramping in her lower abdomen is resolved by ostomy output. She is ready to return to work.  Past Medical History:  Diagnosis Date  . Allergy   . Cancer ([place])    cervical anc rectal  . GERD (gastroesophageal reflux disease)    past hx but better wirg weight loss   . History of kidney stones   . Mild intermittent asthma    seasonal or allery induced   Past Surgical History:  Procedure Laterality Date  . BIOPSY  01/15/2020   Procedure: BIOPSY;  Surgeon: Rush Landmark Telford Nab.,  MD;  Location: [facility];  Service: Gastroenterology;;  . cervical cancer surgery  1995~   Dr Stann Mainland   . COLONOSCOPY WITH PROPOFOL N/A 01/15/2020   Procedure: COLONOSCOPY WITH PROPOFOL;  Surgeon: Rush Landmark Telford Nab., MD;  Location: [facility];  Service: Gastroenterology;  Laterality: N/A;  . HAND SURGERY Right   . SUBMUCOSAL LIFTING INJECTION  01/15/2020   Procedure: SUBMUCOSAL LIFTING INJECTION;  Surgeon: Rush Landmark Telford Nab., MD;  Location: [facility];  Service: Gastroenterology;;  . tubal preganancy    . XI ROBOTIC ASSISTED LOWER ANTERIOR RESECTION N/A 02/15/2020   Procedure: ROBOTIC LOW ANTERIOR RESECTION, DIVERTING ILEOSTOMY;  Surgeon: Leighton Ruff, MD;  Location: [facility];  Service: General;  Laterality: N/A;   Family History  Problem Relation Age of Onset  . CVA Mother   . Lung cancer Father   . Colon polyps Sister   . Cancer Brother 40       Prostate cancer   . Esophageal cancer Daughter   . Colon cancer Neg Hx   . Stomach cancer Neg Hx   . Rectal cancer Neg Hx    Social History   Socioeconomic History  . Marital status: Married    Spouse name: Not on file  . Number of children: 5  . Years of education: Not on file  . Highest education level: Not on file  Occupational History  . Occupation: health care aid   Tobacco Use  . Smoking status: Former Smoker    Packs/day: 0.25  Years: 20.00    Pack years: 5.00    Quit date: 08/11/1983    Years since quitting: 36.6  . Smokeless tobacco: Never Used  Vaping Use  . Vaping Use: Never used  Substance and Sexual Activity  . Alcohol use: Never  . Drug use: Never  . Sexual activity: Not Currently    Birth control/protection: None, Post-menopausal  Other Topics Concern  . Not on file  Social History Narrative  . Not on file   Social Determinants of Health   Financial Resource Strain:   . Difficulty of Paying Living Expenses: Not on file  Food Insecurity:   . Worried About Charity fundraiser in the Last Year:  Not on file  . Ran Out of Food in the Last Year: Not on file  Transportation Needs:   . Lack of Transportation (Medical): Not on file  . Lack of Transportation (Non-Medical): Not on file  Physical Activity:   . Days of Exercise per Week: Not on file  . Minutes of Exercise per Session: Not on file  Stress:   . Feeling of Stress : Not on file  Social Connections:   . Frequency of Communication with Friends and Family: Not on file  . Frequency of Social Gatherings with Friends and Family: Not on file  . Attends Religious Services: Not on file  . Active Member of Clubs or Organizations: Not on file  . Attends Archivist Meetings: Not on file  . Marital Status: Not on file  Intimate Partner Violence:   . Fear of Current or Ex-Partner: Not on file  . Emotionally Abused: Not on file  . Physically Abused: Not on file  . Sexually Abused: Not on file    Medication History Andreas Blower, CMA; 03/26/2020 11:21 AM) Imodium (2MG  Capsule, Oral) Active. Medications Reconciled No Known Allergies  Review of Systems - General ROS: negative for - chills, fatigue or fever Respiratory ROS: no cough, shortness of breath, or wheezing Cardiovascular ROS: no chest pain or dyspnea on exertion Gastrointestinal ROS: no abdominal pain, change in bowel habits, or black or bloody stools   Vitals (Armen Ferguson CMA; 03/26/2020 11:21 AM) 03/26/2020 11:20 AM Weight: 197.25 lb Height: 65in Body Surface Area: 1.97 m Body Mass Index: 32.82 kg/m  Temp.: 98.13F  Pulse: 68 (Regular)  P.OX: 98% (Room air) BP: 138/88(Sitting, Left Arm, Standard)     Physical Exam Leighton Ruff MD; 2/97/9892 11:31 AM) General Note: Alert, oriented, in no acute distress   Chest and Lung Exam Note: Effort normal   Abdomen Note: Soft, non-tender, non-distended. Incisions dry and intact. No surrounding erythema or drainage. Ileostomy with soft/liquid stool in bag. Stoma is  pink     Assessment & Plan Leighton Ruff MD; 08/28/4172 11:31 AM) RECTAL CANCER ([facility]) Impression: 64 year old female status post low anterior resection in early July 2021. A diverting ileostomy was performed. She is doing well with this and has had good ostomy outputs and good urine output. She will start working on decreasing her Imodium over the next few weeks. We have gotten a Gastrografin enema to evaluate for leak at her anastomosis.  This shows no sign of stricture or anastomotic leak.  We discussed ileostomy reversal in detail.  Risks include bleeding, hernia, infection and damage to adjacent structures.

## 2020-03-28 NOTE — H&P (Signed)
The patient is a 64 year old female who presents with colorectal cancer. 64 year old female who is here today for a newly diagnosed rectal cancer. This was diagnosed with screening colonoscopy. An endoscopic resection was attempted but was unable to lift. Biopsy shows tubular adenoma with high-grade dysplasia and invasive carcinoma could not be ruled out. The area is tattooed. CT scans of the chest, abdomen and pelvis show a small nodule in the right lung, which is thought to be inflammatory. There is a small lesion in the spleen which may require MRI or PET CT in the future. MRI shows a T1/T2 mass in the mid rectum approximately 6.8 cm from the internal anal sphincter. There is no lymph node involvement noted. Patient has a history of 2 ectopic pregnancies and is status post resection of one ovary and tube.  Pt is here for follow-up status post robotic LAR and diverting ileostomy for rectal cancer on 02/15/20. Discharged on 02/28/20. She states she has been doing very well since surgery. She empties her bag about 4-5 times a day. She describes the stool output as pasty. She denies nausea, vomiting, abdominal pain, fevers, or issues with urine. She is walking 2.5-3 miles a day. She states occasionally she feels a pressure in her lower abdomen when walking for long periods of time, but otherwise feels great. She does mention one episode of passing mucus per rectum associated with some abdominal cramping prior. Cramping in her lower abdomen is resolved by ostomy output. She is ready to return to work.  Past Medical History:  Diagnosis Date  . Allergy   . Cancer ([place])    cervical anc rectal  . GERD (gastroesophageal reflux disease)    past hx but better wirg weight loss   . History of kidney stones   . Mild intermittent asthma    seasonal or allery induced   Past Surgical History:  Procedure Laterality Date  . BIOPSY  01/15/2020   Procedure: BIOPSY;  Surgeon: Rush Landmark Telford Nab.,  MD;  Location: [facility];  Service: Gastroenterology;;  . cervical cancer surgery  1995~   Dr Stann Mainland   . COLONOSCOPY WITH PROPOFOL N/A 01/15/2020   Procedure: COLONOSCOPY WITH PROPOFOL;  Surgeon: Rush Landmark Telford Nab., MD;  Location: [facility];  Service: Gastroenterology;  Laterality: N/A;  . HAND SURGERY Right   . SUBMUCOSAL LIFTING INJECTION  01/15/2020   Procedure: SUBMUCOSAL LIFTING INJECTION;  Surgeon: Rush Landmark Telford Nab., MD;  Location: [facility];  Service: Gastroenterology;;  . tubal preganancy    . XI ROBOTIC ASSISTED LOWER ANTERIOR RESECTION N/A 02/15/2020   Procedure: ROBOTIC LOW ANTERIOR RESECTION, DIVERTING ILEOSTOMY;  Surgeon: Leighton Ruff, MD;  Location: [facility];  Service: General;  Laterality: N/A;   Family History  Problem Relation Age of Onset  . CVA Mother   . Lung cancer Father   . Colon polyps Sister   . Cancer Brother 40       Prostate cancer   . Esophageal cancer Daughter   . Colon cancer Neg Hx   . Stomach cancer Neg Hx   . Rectal cancer Neg Hx    Social History   Socioeconomic History  . Marital status: Married    Spouse name: Not on file  . Number of children: 5  . Years of education: Not on file  . Highest education level: Not on file  Occupational History  . Occupation: health care aid   Tobacco Use  . Smoking status: Former Smoker    Packs/day: 0.25  Years: 20.00    Pack years: 5.00    Quit date: 08/11/1983    Years since quitting: 36.6  . Smokeless tobacco: Never Used  Vaping Use  . Vaping Use: Never used  Substance and Sexual Activity  . Alcohol use: Never  . Drug use: Never  . Sexual activity: Not Currently    Birth control/protection: None, Post-menopausal  Other Topics Concern  . Not on file  Social History Narrative  . Not on file   Social Determinants of Health   Financial Resource Strain:   . Difficulty of Paying Living Expenses: Not on file  Food Insecurity:   . Worried About Charity fundraiser in the Last Year:  Not on file  . Ran Out of Food in the Last Year: Not on file  Transportation Needs:   . Lack of Transportation (Medical): Not on file  . Lack of Transportation (Non-Medical): Not on file  Physical Activity:   . Days of Exercise per Week: Not on file  . Minutes of Exercise per Session: Not on file  Stress:   . Feeling of Stress : Not on file  Social Connections:   . Frequency of Communication with Friends and Family: Not on file  . Frequency of Social Gatherings with Friends and Family: Not on file  . Attends Religious Services: Not on file  . Active Member of Clubs or Organizations: Not on file  . Attends Archivist Meetings: Not on file  . Marital Status: Not on file  Intimate Partner Violence:   . Fear of Current or Ex-Partner: Not on file  . Emotionally Abused: Not on file  . Physically Abused: Not on file  . Sexually Abused: Not on file    Medication History Andreas Blower, CMA; 03/26/2020 11:21 AM) Imodium (2MG  Capsule, Oral) Active. Medications Reconciled No Known Allergies  Review of Systems - General ROS: negative for - chills, fatigue or fever Respiratory ROS: no cough, shortness of breath, or wheezing Cardiovascular ROS: no chest pain or dyspnea on exertion Gastrointestinal ROS: no abdominal pain, change in bowel habits, or black or bloody stools   Vitals (Armen Ferguson CMA; 03/26/2020 11:21 AM) 03/26/2020 11:20 AM Weight: 197.25 lb Height: 65in Body Surface Area: 1.97 m Body Mass Index: 32.82 kg/m  Temp.: 98.49F  Pulse: 68 (Regular)  P.OX: 98% (Room air) BP: 138/88(Sitting, Left Arm, Standard)     Physical Exam Leighton Ruff MD; 8/46/6599 11:31 AM) General Note: Alert, oriented, in no acute distress   Chest and Lung Exam Note: Effort normal   Abdomen Note: Soft, non-tender, non-distended. Incisions dry and intact. No surrounding erythema or drainage. Ileostomy with soft/liquid stool in bag. Stoma is  pink     Assessment & Plan Leighton Ruff MD; 3/57/0177 11:31 AM) RECTAL CANCER ([facility]) Impression: 64 year old female status post low anterior resection in early July 2021. A diverting ileostomy was performed. She is doing well with this and has had good ostomy outputs and good urine output. She will start working on decreasing her Imodium over the next few weeks. We have gotten a Gastrografin enema to evaluate for leak at her anastomosis.  This shows no sign of stricture or anastomotic leak.  We discussed ileostomy reversal in detail.  Risks include bleeding, hernia, infection and damage to adjacent structures.

## 2020-04-10 ENCOUNTER — Ambulatory Visit: Payer: Medicare Other | Admitting: Hematology

## 2020-04-17 NOTE — Progress Notes (Signed)
DUE TO COVID-19 ONLY ONE VISITOR IS ALLOWED TO COME WITH YOU AND STAY IN THE WAITING ROOM ONLY DURING PRE OP AND PROCEDURE DAY OF SURGERY. THE 1 VISITOR  MAY VISIT WITH YOU AFTER SURGERY IN YOUR PRIVATE ROOM DURING VISITING HOURS ONLY!  YOU NEED TO HAVE A COVID 19 TEST ON_9/14/21 ______ @_______ , THIS TEST MUST BE DONE BEFORE SURGERY,  COVID TESTING SITE [address] [place] [address], IT IS ON THE RIGHT GOING OUT [address] APPROXIMATELY  2 MINUTES PAST [employer] ON THE RIGHT. ONCE YOUR COVID TEST IS COMPLETED,  PLEASE BEGIN THE QUARANTINE INSTRUCTIONS AS OUTLINED IN YOUR HANDOUT.                Laura Mcdonald  04/17/2020   Your procedure is scheduled on: 04/26/20   Report to [facility] Main  Entrance   Report to admitting at   [place] AM     Call this number if you have problems the morning of surgery [phone]    Remember: Do not eat food , candy gum or mints :After Midnight. You may have clear liquids from midnight until 0430AM    CLEAR LIQUID DIET   Foods Allowed                                                                       Coffee and tea, regular and decaf                              Plain Jell-O any favor except red or purple                                            Fruit ices (not with fruit pulp)                                      Iced Popsicles                                     Carbonated beverages, regular and diet                                    Cranberry, grape and apple juices Sports drinks like Gatorade Lightly seasoned clear broth or consume(fat free) Sugar, honey syrup   _____________________________________________________________________    BRUSH YOUR TEETH MORNING OF SURGERY AND RINSE YOUR MOUTH OUT, NO CHEWING GUM CANDY OR MINTS.     Take these medicines the morning of surgery with A SIP OF WATER: iNHALERS AS USUAL AND BRING                                  You may not have any metal on your body  including hair pins and  piercings  Do not wear jewelry, make-up, lotions, powders or perfumes, deodorant             Do not wear nail polish on your fingernails.  Do not shave  48 hours prior to surgery.              Men may shave face and neck.   Do not bring valuables to the hospital. [name].  Contacts, dentures or bridgework may not be worn into surgery.  Leave suitcase in the car. After surgery it may be brought to your room.     Patients discharged the day of surgery will not be allowed to drive home. IF YOU ARE HAVING SURGERY AND GOING HOME THE SAME DAY, YOU MUST HAVE AN ADULT TO DRIVE YOU HOME AND BE WITH YOU FOR 24 HOURS. YOU MAY GO HOME BY TAXI OR UBER OR ORTHERWISE, BUT AN ADULT MUST ACCOMPANY YOU HOME AND STAY WITH YOU FOR 24 HOURS.  Name and phone number of your driver:  Special Instructions: N/A              Please read over the following fact sheets you were given: _____________________________________________________________________  [facility] - Preparing for Surgery Before surgery, you can play an important role.  Because skin is not sterile, your skin needs to be as free of germs as possible.  You can reduce the number of germs on your skin by washing with CHG (chlorahexidine gluconate) soap before surgery.  CHG is an antiseptic cleaner which kills germs and bonds with the skin to continue killing germs even after washing. Please DO NOT use if you have an allergy to CHG or antibacterial soaps.  If your skin becomes reddened/irritated stop using the CHG and inform your nurse when you arrive at Short Stay. Do not shave (including legs and underarms) for at least 48 hours prior to the first CHG shower.  You may shave your face/neck. Please follow these instructions carefully:  1.  Shower with CHG Soap the night before surgery and the  morning of Surgery.  2.  If you choose to wash your hair, wash your hair first  as usual with your  normal  shampoo.  3.  After you shampoo, rinse your hair and body thoroughly to remove the  shampoo.                           4.  Use CHG as you would any other liquid soap.  You can apply chg directly  to the skin and wash                       Gently with a scrungie or clean washcloth.  5.  Apply the CHG Soap to your body ONLY FROM THE NECK DOWN.   Do not use on face/ open                           Wound or open sores. Avoid contact with eyes, ears mouth and genitals (private parts).                       Wash face,  Genitals (private parts) with your normal soap.             6.  Wash thoroughly, paying special attention to the area where your surgery  will be performed.  7.  Thoroughly rinse your body with warm water from the neck down.  8.  DO NOT shower/wash with your normal soap after using and rinsing off  the CHG Soap.                9.  Pat yourself dry with a clean towel.            10.  Wear clean pajamas.            11.  Place clean sheets on your bed the night of your first shower and do not  sleep with pets. Day of Surgery : Do not apply any lotions/deodorants the morning of surgery.  Please wear clean clothes to the hospital/surgery center.  FAILURE TO FOLLOW THESE INSTRUCTIONS MAY RESULT IN THE CANCELLATION OF YOUR SURGERY PATIENT SIGNATURE_________________________________  NURSE SIGNATURE__________________________________  ________________________________________________________________________

## 2020-04-18 ENCOUNTER — Other Ambulatory Visit: Payer: Self-pay

## 2020-04-18 ENCOUNTER — Encounter (HOSPITAL_COMMUNITY)
Admission: RE | Admit: 2020-04-18 | Discharge: 2020-04-18 | Disposition: A | Discharge status: Home / Self Care | Payer: Medicare Other | Source: Ambulatory Visit | Attending: General Surgery | Admitting: General Surgery

## 2020-04-18 ENCOUNTER — Encounter (HOSPITAL_COMMUNITY): Payer: Self-pay

## 2020-04-18 DIAGNOSIS — Z01812 Encounter for preprocedural laboratory examination: Secondary | ICD-10-CM | POA: Insufficient documentation

## 2020-04-18 LAB — CBC
HCT: 39.3 % (ref 36.0–46.0)
Hemoglobin: 12.7 g/dL (ref 12.0–15.0)
MCH: 28.3 pg (ref 26.0–34.0)
MCHC: 32.3 g/dL (ref 30.0–36.0)
MCV: 87.5 fL (ref 80.0–100.0)
Platelets: 318 10*3/uL (ref 150–400)
RBC: 4.49 MIL/uL (ref 3.87–5.11)
RDW: 12.8 % (ref 11.5–15.5)
WBC: 7 10*3/uL (ref 4.0–10.5)
nRBC: 0 % (ref 0.0–0.2)

## 2020-04-23 ENCOUNTER — Other Ambulatory Visit (HOSPITAL_COMMUNITY)
Admission: RE | Admit: 2020-04-23 | Discharge: 2020-04-23 | Disposition: A | Discharge status: Home / Self Care | Payer: Medicare Other | Source: Ambulatory Visit | Attending: General Surgery | Admitting: General Surgery

## 2020-04-23 DIAGNOSIS — Z20822 Contact with and (suspected) exposure to covid-19: Secondary | ICD-10-CM | POA: Insufficient documentation

## 2020-04-23 DIAGNOSIS — Z01812 Encounter for preprocedural laboratory examination: Secondary | ICD-10-CM | POA: Insufficient documentation

## 2020-04-23 LAB — SARS CORONAVIRUS 2 (TAT 6-24 HRS): SARS Coronavirus 2: NEGATIVE

## 2020-04-25 MED ORDER — BUPIVACAINE LIPOSOME 1.3 % IJ SUSP
20.0000 mL | Freq: Once | INTRAMUSCULAR | Status: DC
  Filled 2020-04-25: qty 20

## 2020-04-25 NOTE — Anesthesia Preprocedure Evaluation (Addendum)
Anesthesia Evaluation  Patient identified by MRN, date of birth, ID band Patient awake    Reviewed: Allergy & Precautions, NPO status , Patient's Chart, lab work & pertinent test results  History of Anesthesia Complications Negative for: history of anesthetic complications  Airway Mallampati: I  TM Distance: >3 FB Neck ROM: Full    Dental  (+) Dental Advisory Given, Caps   Pulmonary COPD,  COPD inhaler, former smoker,  04/23/2020 SARS coronavirus NEG   breath sounds clear to auscultation       Cardiovascular negative cardio ROS   Rhythm:Regular Rate:Normal     Neuro/Psych negative neurological ROS     GI/Hepatic Neg liver ROS, GERD  Controlled,Rectal cancer Diverting ileostomy   Endo/Other  negative endocrine ROS  Renal/GU negative Renal ROS   H/o cervical cancer    Musculoskeletal  (+) Arthritis ,   Abdominal (+) + obese,   Peds  Hematology negative hematology ROS (+)   Anesthesia Other Findings   Reproductive/Obstetrics                            Anesthesia Physical Anesthesia Plan  ASA: III  Anesthesia Plan: General   Post-op Pain Management:    Induction: Intravenous  PONV Risk Score and Plan: 4 or greater and Ondansetron, Dexamethasone and Droperidol  Airway Management Planned: Oral ETT  Additional Equipment: None  Intra-op Plan:   Post-operative Plan: Extubation in OR  Informed Consent: I have reviewed the patients History and Physical, chart, labs and discussed the procedure including the risks, benefits and alternatives for the proposed anesthesia with the patient or authorized representative who has indicated his/her understanding and acceptance.     Dental advisory given  Plan Discussed with: CRNA and Surgeon  Anesthesia Plan Comments:        Anesthesia Quick Evaluation

## 2020-04-26 ENCOUNTER — Encounter (HOSPITAL_COMMUNITY)
Admission: RE | Disposition: A | Discharge status: Home / Self Care | Payer: Self-pay | Source: Ambulatory Visit | Attending: General Surgery

## 2020-04-26 ENCOUNTER — Other Ambulatory Visit: Payer: Self-pay

## 2020-04-26 ENCOUNTER — Encounter (HOSPITAL_COMMUNITY): Payer: Self-pay | Admitting: General Surgery

## 2020-04-26 ENCOUNTER — Inpatient Hospital Stay (HOSPITAL_COMMUNITY): Payer: Medicare Other | Admitting: Anesthesiology

## 2020-04-26 ENCOUNTER — Inpatient Hospital Stay (HOSPITAL_COMMUNITY)
Admission: RE | Admit: 2020-04-26 | Discharge: 2020-04-28 | DRG: 330 | Disposition: A | Discharge status: Home / Self Care | Payer: Medicare Other | Attending: Surgery | Admitting: Surgery

## 2020-04-26 DIAGNOSIS — C20 Malignant neoplasm of rectum: Secondary | ICD-10-CM | POA: Diagnosis present

## 2020-04-26 DIAGNOSIS — Z87891 Personal history of nicotine dependence: Secondary | ICD-10-CM

## 2020-04-26 DIAGNOSIS — Z8541 Personal history of malignant neoplasm of cervix uteri: Secondary | ICD-10-CM

## 2020-04-26 DIAGNOSIS — Z432 Encounter for attention to ileostomy: Principal | ICD-10-CM

## 2020-04-26 DIAGNOSIS — K219 Gastro-esophageal reflux disease without esophagitis: Secondary | ICD-10-CM | POA: Diagnosis present

## 2020-04-26 DIAGNOSIS — Z20822 Contact with and (suspected) exposure to covid-19: Secondary | ICD-10-CM | POA: Diagnosis present

## 2020-04-26 HISTORY — PX: COLOSTOMY REVERSAL: SHX5782

## 2020-04-26 LAB — TYPE AND SCREEN
ABO/RH(D): A POS
Antibody Screen: NEGATIVE

## 2020-04-26 SURGERY — COLOSTOMY REVERSAL
Anesthesia: General | Site: Abdomen

## 2020-04-26 MED ORDER — LIDOCAINE 2% (20 MG/ML) 5 ML SYRINGE
INTRAMUSCULAR | Status: DC | PRN
  Administered 2020-04-26: 20 mg via INTRAVENOUS

## 2020-04-26 MED ORDER — SACCHAROMYCES BOULARDII 250 MG PO CAPS
250.0000 mg | ORAL_CAPSULE | Freq: Two times a day (BID) | ORAL | Status: DC
  Administered 2020-04-26 – 2020-04-28 (×4): 250 mg via ORAL
  Filled 2020-04-26 (×4): qty 1

## 2020-04-26 MED ORDER — BUPIVACAINE-EPINEPHRINE 0.25% -1:200000 IJ SOLN
INTRAMUSCULAR | Status: DC | PRN
  Administered 2020-04-26: 30 mL

## 2020-04-26 MED ORDER — GABAPENTIN 300 MG PO CAPS
300.0000 mg | ORAL_CAPSULE | Freq: Two times a day (BID) | ORAL | Status: DC
  Administered 2020-04-26 – 2020-04-28 (×4): 300 mg via ORAL
  Filled 2020-04-26 (×4): qty 1

## 2020-04-26 MED ORDER — MIDAZOLAM HCL 2 MG/2ML IJ SOLN
INTRAMUSCULAR | Status: AC
  Filled 2020-04-26: qty 2

## 2020-04-26 MED ORDER — DEXAMETHASONE SODIUM PHOSPHATE 10 MG/ML IJ SOLN
INTRAMUSCULAR | Status: DC | PRN
  Administered 2020-04-26: 10 mg via INTRAVENOUS

## 2020-04-26 MED ORDER — HYDROMORPHONE HCL 1 MG/ML IJ SOLN: 0.2500 mg | INTRAMUSCULAR | Status: DC | PRN

## 2020-04-26 MED ORDER — ENSURE SURGERY PO LIQD
237.0000 mL | Freq: Two times a day (BID) | ORAL | Status: DC
  Administered 2020-04-27 – 2020-04-28 (×3): 237 mL via ORAL

## 2020-04-26 MED ORDER — SODIUM CHLORIDE 0.9 % IV SOLN
2.0000 g | Freq: Two times a day (BID) | INTRAVENOUS | Status: AC
  Administered 2020-04-26: 2 g via INTRAVENOUS
  Filled 2020-04-26: qty 2

## 2020-04-26 MED ORDER — HYDROMORPHONE HCL 1 MG/ML IJ SOLN
0.5000 mg | INTRAMUSCULAR | Status: DC | PRN
  Administered 2020-04-26 – 2020-04-27 (×2): 0.5 mg via INTRAVENOUS
  Filled 2020-04-26 (×2): qty 0.5

## 2020-04-26 MED ORDER — ALUM & MAG HYDROXIDE-SIMETH 200-200-20 MG/5ML PO SUSP: 30.0000 mL | Freq: Four times a day (QID) | ORAL | Status: DC | PRN

## 2020-04-26 MED ORDER — BUPIVACAINE LIPOSOME 1.3 % IJ SUSP
INTRAMUSCULAR | Status: DC | PRN
  Administered 2020-04-26: 20 mL

## 2020-04-26 MED ORDER — ALBUTEROL SULFATE (2.5 MG/3ML) 0.083% IN NEBU: 2.5000 mg | INHALATION_SOLUTION | Freq: Four times a day (QID) | RESPIRATORY_TRACT | Status: DC | PRN

## 2020-04-26 MED ORDER — OXYCODONE HCL 5 MG PO TABS: 5.0000 mg | ORAL_TABLET | Freq: Once | ORAL | Status: DC | PRN

## 2020-04-26 MED ORDER — ACETAMINOPHEN 500 MG PO TABS
1000.0000 mg | ORAL_TABLET | Freq: Four times a day (QID) | ORAL | Status: DC
  Administered 2020-04-26 – 2020-04-28 (×8): 1000 mg via ORAL
  Filled 2020-04-26 (×9): qty 2

## 2020-04-26 MED ORDER — MIDAZOLAM HCL 5 MG/5ML IJ SOLN
INTRAMUSCULAR | Status: DC | PRN
  Administered 2020-04-26: 2 mg via INTRAVENOUS

## 2020-04-26 MED ORDER — ROCURONIUM BROMIDE 10 MG/ML (PF) SYRINGE
PREFILLED_SYRINGE | INTRAVENOUS | Status: AC
  Filled 2020-04-26: qty 10

## 2020-04-26 MED ORDER — MEPERIDINE HCL 50 MG/ML IJ SOLN: 6.2500 mg | INTRAMUSCULAR | Status: DC | PRN

## 2020-04-26 MED ORDER — ENOXAPARIN SODIUM 40 MG/0.4ML ~~LOC~~ SOLN
40.0000 mg | SUBCUTANEOUS | Status: DC
  Administered 2020-04-27 – 2020-04-28 (×2): 40 mg via SUBCUTANEOUS
  Filled 2020-04-26 (×2): qty 0.4

## 2020-04-26 MED ORDER — ONDANSETRON HCL 4 MG PO TABS: 4.0000 mg | ORAL_TABLET | Freq: Four times a day (QID) | ORAL | Status: DC | PRN

## 2020-04-26 MED ORDER — SODIUM CHLORIDE 0.9 % IV SOLN
2.0000 g | INTRAVENOUS | Status: AC
  Administered 2020-04-26: 2 g via INTRAVENOUS
  Filled 2020-04-26: qty 2

## 2020-04-26 MED ORDER — CHLORHEXIDINE GLUCONATE 0.12 % MT SOLN
15.0000 mL | Freq: Once | OROMUCOSAL | Status: AC
  Administered 2020-04-26: 15 mL via OROMUCOSAL

## 2020-04-26 MED ORDER — PROPOFOL 10 MG/ML IV BOLUS
INTRAVENOUS | Status: AC
  Filled 2020-04-26: qty 20

## 2020-04-26 MED ORDER — PROPOFOL 10 MG/ML IV BOLUS
INTRAVENOUS | Status: DC | PRN
  Administered 2020-04-26: 150 mg via INTRAVENOUS

## 2020-04-26 MED ORDER — ALVIMOPAN 12 MG PO CAPS: 12.0000 mg | ORAL_CAPSULE | Freq: Two times a day (BID) | ORAL | Status: DC

## 2020-04-26 MED ORDER — ONDANSETRON HCL 4 MG/2ML IJ SOLN
INTRAMUSCULAR | Status: AC
  Filled 2020-04-26: qty 2

## 2020-04-26 MED ORDER — ONDANSETRON HCL 4 MG/2ML IJ SOLN: 4.0000 mg | Freq: Four times a day (QID) | INTRAMUSCULAR | Status: DC | PRN

## 2020-04-26 MED ORDER — ONDANSETRON HCL 4 MG/2ML IJ SOLN
INTRAMUSCULAR | Status: DC | PRN
  Administered 2020-04-26: 4 mg via INTRAVENOUS

## 2020-04-26 MED ORDER — FENTANYL CITRATE (PF) 100 MCG/2ML IJ SOLN
INTRAMUSCULAR | Status: DC | PRN
  Administered 2020-04-26: 150 ug via INTRAVENOUS
  Administered 2020-04-26 (×2): 50 ug via INTRAVENOUS

## 2020-04-26 MED ORDER — ACETAMINOPHEN 500 MG PO TABS
1000.0000 mg | ORAL_TABLET | Freq: Once | ORAL | Status: AC
  Administered 2020-04-26: 1000 mg via ORAL
  Filled 2020-04-26: qty 2

## 2020-04-26 MED ORDER — ROCURONIUM BROMIDE 10 MG/ML (PF) SYRINGE
PREFILLED_SYRINGE | INTRAVENOUS | Status: DC | PRN
  Administered 2020-04-26: 50 mg via INTRAVENOUS
  Administered 2020-04-26: 10 mg via INTRAVENOUS

## 2020-04-26 MED ORDER — TRAMADOL HCL 50 MG PO TABS
50.0000 mg | ORAL_TABLET | Freq: Four times a day (QID) | ORAL | Status: DC | PRN
  Administered 2020-04-27 – 2020-04-28 (×4): 50 mg via ORAL
  Filled 2020-04-26 (×4): qty 1

## 2020-04-26 MED ORDER — DEXAMETHASONE SODIUM PHOSPHATE 10 MG/ML IJ SOLN
INTRAMUSCULAR | Status: AC
  Filled 2020-04-26: qty 1

## 2020-04-26 MED ORDER — BUPIVACAINE-EPINEPHRINE (PF) 0.25% -1:200000 IJ SOLN
INTRAMUSCULAR | Status: AC
  Filled 2020-04-26: qty 30

## 2020-04-26 MED ORDER — LACTATED RINGERS IV SOLN: INTRAVENOUS | Status: DC

## 2020-04-26 MED ORDER — KCL IN DEXTROSE-NACL 20-5-0.45 MEQ/L-%-% IV SOLN
INTRAVENOUS | Status: DC
  Filled 2020-04-26 (×4): qty 1000

## 2020-04-26 MED ORDER — LIDOCAINE 2% (20 MG/ML) 5 ML SYRINGE
INTRAMUSCULAR | Status: AC
  Filled 2020-04-26: qty 5

## 2020-04-26 MED ORDER — MIDAZOLAM HCL 2 MG/2ML IJ SOLN: 0.5000 mg | Freq: Once | INTRAMUSCULAR | Status: DC | PRN

## 2020-04-26 MED ORDER — OXYCODONE HCL 5 MG/5ML PO SOLN: 5.0000 mg | Freq: Once | ORAL | Status: DC | PRN

## 2020-04-26 MED ORDER — ORAL CARE MOUTH RINSE: 15.0000 mL | Freq: Once | OROMUCOSAL | Status: AC

## 2020-04-26 MED ORDER — FENTANYL CITRATE (PF) 250 MCG/5ML IJ SOLN
INTRAMUSCULAR | Status: AC
  Filled 2020-04-26: qty 5

## 2020-04-26 MED ORDER — PROMETHAZINE HCL 25 MG/ML IJ SOLN: 6.2500 mg | INTRAMUSCULAR | Status: DC | PRN

## 2020-04-26 MED ORDER — GABAPENTIN 300 MG PO CAPS
300.0000 mg | ORAL_CAPSULE | ORAL | Status: AC
  Administered 2020-04-26: 300 mg via ORAL
  Filled 2020-04-26: qty 1

## 2020-04-26 MED ORDER — 0.9 % SODIUM CHLORIDE (POUR BTL) OPTIME
TOPICAL | Status: DC | PRN
  Administered 2020-04-26: 2000 mL

## 2020-04-26 MED ORDER — SUGAMMADEX SODIUM 200 MG/2ML IV SOLN
INTRAVENOUS | Status: DC | PRN
  Administered 2020-04-26: 200 mg via INTRAVENOUS

## 2020-04-26 SURGICAL SUPPLY — 50 items
BLADE EXTENDED COATED 6.5IN (ELECTRODE) IMPLANT
CELLS DAT CNTRL 66122 CELL SVR (MISCELLANEOUS) IMPLANT
COUNTER NEEDLE 20 DBL MAG RED (NEEDLE) ×3 IMPLANT
COVER WAND RF STERILE (DRAPES) IMPLANT
DECANTER SPIKE VIAL GLASS SM (MISCELLANEOUS) ×3 IMPLANT
DERMABOND ADVANCED (GAUZE/BANDAGES/DRESSINGS)
DERMABOND ADVANCED .7 DNX12 (GAUZE/BANDAGES/DRESSINGS) IMPLANT
DRAIN CHANNEL 19F RND (DRAIN) IMPLANT
DRSG OPSITE POSTOP 4X10 (GAUZE/BANDAGES/DRESSINGS) IMPLANT
DRSG OPSITE POSTOP 4X6 (GAUZE/BANDAGES/DRESSINGS) IMPLANT
DRSG OPSITE POSTOP 4X8 (GAUZE/BANDAGES/DRESSINGS) IMPLANT
DRSG PAD ABDOMINAL 8X10 ST (GAUZE/BANDAGES/DRESSINGS) ×3 IMPLANT
DRSG TELFA 3X8 NADH (GAUZE/BANDAGES/DRESSINGS) ×3 IMPLANT
ELECT REM PT RETURN 15FT ADLT (MISCELLANEOUS) ×3 IMPLANT
EVACUATOR SILICONE 100CC (DRAIN) IMPLANT
GAUZE SPONGE 4X4 12PLY STRL (GAUZE/BANDAGES/DRESSINGS) ×3 IMPLANT
GLOVE BIO SURGEON STRL SZ 6.5 (GLOVE) ×4 IMPLANT
GLOVE BIO SURGEONS STRL SZ 6.5 (GLOVE) ×2
GLOVE BIOGEL PI IND STRL 7.0 (GLOVE) ×4 IMPLANT
GLOVE BIOGEL PI INDICATOR 7.0 (GLOVE) ×8
GOWN STRL REUS W/TWL XL LVL3 (GOWN DISPOSABLE) ×18 IMPLANT
KIT TURNOVER KIT A (KITS) ×3 IMPLANT
PACK COLON (CUSTOM PROCEDURE TRAY) ×3 IMPLANT
RELOAD PROXIMATE 75MM BLUE (ENDOMECHANICALS) ×6 IMPLANT
RTRCTR WOUND ALEXIS 18CM MED (MISCELLANEOUS)
SEALER TISSUE G2 STRG ARTC 35C (ENDOMECHANICALS) IMPLANT
SPONGE LAP 18X18 RF (DISPOSABLE) ×6 IMPLANT
STAPLER GUN LINEAR PROX 60 (STAPLE) ×3 IMPLANT
STAPLER PROXIMATE 75MM BLUE (STAPLE) ×3 IMPLANT
STAPLER VISISTAT 35W (STAPLE) IMPLANT
SURGILUBE 2OZ TUBE FLIPTOP (MISCELLANEOUS) ×3 IMPLANT
SUT ETHILON 2 0 PS N (SUTURE) IMPLANT
SUT NOVA NAB DX-16 0-1 5-0 T12 (SUTURE) ×3 IMPLANT
SUT NOVA NAB GS-21 0 18 T12 DT (SUTURE) ×6 IMPLANT
SUT PDS AB 1 CTX 36 (SUTURE) IMPLANT
SUT PDS AB 1 TP1 96 (SUTURE) IMPLANT
SUT PROLENE 2 0 KS (SUTURE) ×3 IMPLANT
SUT SILK 2 0 (SUTURE) ×3
SUT SILK 2 0 SH CR/8 (SUTURE) ×3 IMPLANT
SUT SILK 2-0 18XBRD TIE 12 (SUTURE) ×1 IMPLANT
SUT SILK 3 0 (SUTURE) ×3
SUT SILK 3 0 SH CR/8 (SUTURE) ×3 IMPLANT
SUT SILK 3-0 18XBRD TIE 12 (SUTURE) ×1 IMPLANT
SUT VIC AB 2-0 SH 18 (SUTURE) ×3 IMPLANT
SUT VIC AB 2-0 SH 27 (SUTURE) ×3
SUT VIC AB 2-0 SH 27X BRD (SUTURE) ×1 IMPLANT
SUT VIC AB 4-0 PS2 27 (SUTURE) ×3 IMPLANT
TRAY FOLEY MTR SLVR 14FR STAT (SET/KITS/TRAYS/PACK) ×3 IMPLANT
TUBING CONNECTING 10 (TUBING) ×2 IMPLANT
TUBING CONNECTING 10' (TUBING) ×1

## 2020-04-26 NOTE — Anesthesia Procedure Notes (Signed)
Procedure Name: Intubation Date/Time: 04/26/2020 7:47 AM Performed by: Gerald Leitz, CRNA Pre-anesthesia Checklist: Patient identified, Patient being monitored, Timeout performed, Emergency Drugs available and Suction available Patient Re-evaluated:Patient Re-evaluated prior to induction Oxygen Delivery Method: Circle system utilized Preoxygenation: Pre-oxygenation with 100% oxygen Induction Type: IV induction Ventilation: Mask ventilation without difficulty Laryngoscope Size: Mac and 3 Grade View: Grade I Tube type: Oral Tube size: 7.0 mm Number of attempts: 1 Placement Confirmation: ETT inserted through vocal cords under direct vision,  positive ETCO2 and breath sounds checked- equal and bilateral Secured at: 21 cm Tube secured with: Tape Dental Injury: Teeth and Oropharynx as per pre-operative assessment

## 2020-04-26 NOTE — Interval H&P Note (Signed)
History and Physical Interval Note:  04/26/2020 7:10 AM  Laura Mcdonald  has presented today for surgery, with the diagnosis of DIVERTING ILEOSTOMY, RECTAL CANCER.  The various methods of treatment have been discussed with the patient and family. After consideration of risks, benefits and other options for treatment, the patient has consented to  Procedure(s): ILEOSTOMY REVERSAL (N/A) as a surgical intervention.  The patient's history has been reviewed, patient examined, no change in status, stable for surgery.  I have reviewed the patient's chart and labs.  Questions were answered to the patient's satisfaction.     Rosario Adie, MD  [facility]

## 2020-04-26 NOTE — Op Note (Signed)
DATE OF PROCEDURE: 04/26/2020   PREOPERATIVE DIAGNOSIS: Rectal cancer   POSTOPERATIVE DIAGNOSIS: Rectal cancer  PROCEDURE PERFORMED:  Ileostomy takedown  SURGEON: Leighton Ruff, MD (Attending) Daiva Huge, MD (Resident, PGY-7)   ANESTHESIA: General   INDICATIONS: The patient is a 64 y.o. Female with history of rectal cancer and prior LAR and DLI, here today for ileostomy takedown.    PROCEDURE IN DETAIL:  The patient was brought into the OR and given general anesthesia. She was prepped and draped in the usual sterile fashion.  A circumferencial incision was made around the stoma and sharp and blunt dissection was used to mobilize both limbs of the bowel. There were many dense adhesions to the fascia which were taken down with a combination of blunt and sharp dissection. Separate firing of the GIA-75 stapler were used to divide each limb of the ileum.  The mesentery was taken with vicryl ties.  A small enterotomy was made in both staple lines and another load of the GIA-80 stapler was used to complete the anastomosis.  No bleeding was noted in the staple line.  A TA-60 stapler was used to close the common enterotomy.  Lembert stitches were used to reinforce the staple line.  The bowel was placed back in the abdomen and the fascia was closed with #1 Novafil.  Experal was infused around the fascia.  A 2-0 vicryl was used to pursestring the incision and the remainder was packed with telfa.  The patient tolerated the procedure well and was extubated and sent to the PACU in stable condition.    ESTIMATED BLOOD LOSS: 50 cc  SPECIMENS: Ileostomy  DRAINS: None  COMPLICATIONS: None

## 2020-04-26 NOTE — Transfer of Care (Signed)
Immediate Anesthesia Transfer of Care Note  Patient: Laura Mcdonald  Procedure(s) Performed: Procedure(s): ILEOSTOMY REVERSAL (N/A)  Patient Location: PACU  Anesthesia Type:General  Level of Consciousness: Alert, Awake, Oriented  Airway & Oxygen Therapy: Patient Spontanous Breathing  Post-op Assessment: Report given to RN  Post vital signs: Reviewed and stable  Last Vitals:  Vitals:   04/26/20 0540  BP: 126/86  Pulse: 74  Resp: 16  Temp: 36.9 C  SpO2: 89%    Complications: No apparent anesthesia complications

## 2020-04-26 NOTE — Discharge Instructions (Addendum)

## 2020-04-26 NOTE — Anesthesia Postprocedure Evaluation (Signed)
Anesthesia Post Note  Patient: Laura Mcdonald  Procedure(s) Performed: ILEOSTOMY REVERSAL (N/A Abdomen)     Patient location during evaluation: PACU Anesthesia Type: General Level of consciousness: awake and alert, patient cooperative and oriented Pain management: pain level controlled (pain improving) Vital Signs Assessment: post-procedure vital signs reviewed and stable Respiratory status: spontaneous breathing, nonlabored ventilation and respiratory function stable Cardiovascular status: blood pressure returned to baseline and stable Postop Assessment: no apparent nausea or vomiting Anesthetic complications: no   No complications documented.  Last Vitals:  Vitals:   04/26/20 1130 04/26/20 1203  BP: 121/73 122/77  Pulse: 63 67  Resp: 16 18  Temp: (!) 36.4 C (!) 35.9 C  SpO2: 100% 100%                  Laura Mcdonald,E. Laura Mcdonald

## 2020-04-27 ENCOUNTER — Encounter (HOSPITAL_COMMUNITY): Payer: Self-pay | Admitting: General Surgery

## 2020-04-27 LAB — CBC
HCT: 33.8 % — ABNORMAL LOW (ref 36.0–46.0)
HCT: 32.9 % — ABNORMAL LOW (ref 36.0–46.0)
Hemoglobin: 10.9 g/dL — ABNORMAL LOW (ref 12.0–15.0)
Hemoglobin: 11 g/dL — ABNORMAL LOW (ref 12.0–15.0)
MCH: 28.8 pg (ref 26.0–34.0)
MCH: 29.3 pg (ref 26.0–34.0)
MCHC: 32.2 g/dL (ref 30.0–36.0)
MCHC: 33.4 g/dL (ref 30.0–36.0)
MCV: 89.4 fL (ref 80.0–100.0)
MCV: 87.7 fL (ref 80.0–100.0)
Platelets: 300 10*3/uL (ref 150–400)
Platelets: 287 10*3/uL (ref 150–400)
RBC: 3.78 MIL/uL — ABNORMAL LOW (ref 3.87–5.11)
RBC: 3.75 MIL/uL — ABNORMAL LOW (ref 3.87–5.11)
RDW: 13.1 % (ref 11.5–15.5)
RDW: 13.2 % (ref 11.5–15.5)
WBC: 16.7 10*3/uL — ABNORMAL HIGH (ref 4.0–10.5)
WBC: 15.3 10*3/uL — ABNORMAL HIGH (ref 4.0–10.5)
nRBC: 0 % (ref 0.0–0.2)
nRBC: 0 % (ref 0.0–0.2)

## 2020-04-27 LAB — BASIC METABOLIC PANEL
Anion gap: 10 (ref 5–15)
BUN: 11 mg/dL (ref 8–23)
CO2: 20 mmol/L — ABNORMAL LOW (ref 22–32)
Calcium: 9.5 mg/dL (ref 8.9–10.3)
Chloride: 109 mmol/L (ref 98–111)
Creatinine, Ser: 0.92 mg/dL (ref 0.44–1.00)
GFR calc Af Amer: >60 mL/min (ref >60)
GFR calc non Af Amer: >60 mL/min (ref >60)
Glucose, Bld: 137 mg/dL — ABNORMAL HIGH (ref 70–99)
Potassium: 4 mmol/L (ref 3.5–5.1)
Sodium: 139 mmol/L (ref 135–145)

## 2020-04-27 NOTE — Progress Notes (Signed)
1 Day Post-Op   Subjective/Chief Complaint: NO COMPLAINTS    Objective: Vital signs in last 24 hours: Temp:  [96 F (35.6 C)-99.2 F (37.3 C)] 99.2 F (37.3 C) (09/18 0508) Pulse Rate:  [63-84] 80 (09/18 0508) Resp:  [13-19] 18 (09/18 0508) BP: (113-154)/(66-85) 116/66 (09/18 0508) SpO2:  [99 %-100 %] 99 % (09/18 0508) Last BM Date: 04/26/20  Intake/Output from previous day: 09/17 0701 - 09/18 0700 In: 3653 [P.O.:720; I.V.:2733; IV Piggyback:200] Out: 9233 [Urine:3525; Blood:35] Intake/Output this shift: No intake/output data recorded.  Abdomen soft NT ND wound open packed and clean   Lab Results:  Recent Labs    04/27/20 0354  WBC 16.7*  HGB 10.9*  HCT 33.8*  PLT 300   BMET Recent Labs    04/27/20 0354  NA 139  K 4.0  CL 109  CO2 20*  GLUCOSE 137*  BUN 11  CREATININE 0.92  CALCIUM 9.5   PT/INR No results for input(s): LABPROT, INR in the last 72 hours. ABG No results for input(s): PHART, HCO3 in the last 72 hours.  Invalid input(s): PCO2, PO2  Studies/Results: No results found.  Anti-infectives: Anti-infectives (From admission, onward)   Start     Dose/Rate Route Frequency Ordered Stop   04/26/20 2000  cefoTEtan (CEFOTAN) 2 g in sodium chloride 0.9 % 100 mL IVPB        2 g 200 mL/hr over 30 Minutes Intravenous Every 12 hours 04/26/20 1149 04/26/20 2322   04/26/20 0600  cefoTEtan (CEFOTAN) 2 g in sodium chloride 0.9 % 100 mL IVPB        2 g 200 mL/hr over 30 Minutes Intravenous On call to O.R. 04/26/20 0531 04/26/20 0076      Assessment/Plan: s/p Procedure(s): ILEOSTOMY REVERSAL (N/A) Adv diet Remove packing tomorrow  Ambulate  Advance diet   LOS: 1 day    Laura Mcdonald 04/27/2020

## 2020-04-28 LAB — BASIC METABOLIC PANEL
Anion gap: 8 (ref 5–15)
BUN: 9 mg/dL (ref 8–23)
CO2: 23 mmol/L (ref 22–32)
Calcium: 9.3 mg/dL (ref 8.9–10.3)
Chloride: 109 mmol/L (ref 98–111)
Creatinine, Ser: 0.72 mg/dL (ref 0.44–1.00)
GFR calc Af Amer: >60 mL/min (ref >60)
GFR calc non Af Amer: >60 mL/min (ref >60)
Glucose, Bld: 107 mg/dL — ABNORMAL HIGH (ref 70–99)
Potassium: 3.8 mmol/L (ref 3.5–5.1)
Sodium: 140 mmol/L (ref 135–145)

## 2020-04-28 LAB — CBC
HCT: 34.1 % — ABNORMAL LOW (ref 36.0–46.0)
Hemoglobin: 11.1 g/dL — ABNORMAL LOW (ref 12.0–15.0)
MCH: 28.8 pg (ref 26.0–34.0)
MCHC: 32.6 g/dL (ref 30.0–36.0)
MCV: 88.6 fL (ref 80.0–100.0)
Platelets: 288 10*3/uL (ref 150–400)
RBC: 3.85 MIL/uL — ABNORMAL LOW (ref 3.87–5.11)
RDW: 13.2 % (ref 11.5–15.5)
WBC: 10.2 10*3/uL (ref 4.0–10.5)
nRBC: 0 % (ref 0.0–0.2)

## 2020-04-28 MED ORDER — HYDROCODONE-ACETAMINOPHEN 5-325 MG PO TABS: 1.0000 | ORAL_TABLET | Freq: Four times a day (QID) | ORAL | 0 refills | Status: DC | PRN

## 2020-04-28 NOTE — Discharge Summary (Signed)
Physician Discharge Summary  Patient ID: Laura Mcdonald MRN: [id number] DOB/AGE: 66/22/1957 66 y.o.  Admit date: 04/26/2020 Discharge date: 04/28/2020  Admission Diagnoses:rectal cancer   Discharge Diagnoses:  Active Problems:   Rectal cancer [facility])   Discharged Condition: good  Hospital Course: Patient admitted after ileostomy closure.  Postoperatively she did well with good pain control and a bowel movement on postop day 1.  By postop day 2 she was walking the halls tolerating a soft diet was discharged home.  Dressing was removed packing removed and dry dressing placed over top.  Discharge instructions were given.    Significant Diagnostic Studies: labs:  CBC    Component Value Date/Time   WBC 10.2 04/28/2020 0424   RBC 3.85 (L) 04/28/2020 0424   HGB 11.1 (L) 04/28/2020 0424   HCT 34.1 (L) 04/28/2020 0424   PLT 288 04/28/2020 0424   MCV 88.6 04/28/2020 0424   MCH 28.8 04/28/2020 0424   MCHC 32.6 04/28/2020 0424   RDW 13.2 04/28/2020 0424    Treatments: surgery: Closure of loop ileostomy  Discharge Exam: Blood pressure 139/84, pulse 72, temperature 98.6 F (37 C), temperature source Oral, resp. rate 20, height 5' 5.5" (1.664 m), weight 89.6 kg, SpO2 97 %. General appearance: alert and cooperative Resp: clear to auscultation bilaterally Cardio: regular rate and rhythm, S1, S2 normal, no murmur, click, rub or gallop Incision/Wound: Right lower quadrant wound examined.  Packing removed.  Dry dressing applied.  No signs of infection.  Disposition: Discharge disposition: 01-Home or Self Care       Discharge Instructions    Diet - low sodium heart healthy   Complete by: As directed    Increase activity slowly   Complete by: As directed      Allergies as of 04/28/2020   No Known Allergies     Medication List    TAKE these medications   albuterol 108 (90 Base) MCG/ACT inhaler Commonly known as: VENTOLIN HFA Inhale 2 puffs into the lungs every 6 (six) hours as  needed for wheezing or shortness of breath.   APPLE CIDER VINEGAR PO Take 1 tablet by mouth daily. gummies a day   BLACK ELDERBERRY PO Take 500 mg by mouth daily.   HYDROcodone-acetaminophen 5-325 MG tablet Commonly known as: NORCO/VICODIN Take 1 tablet by mouth every 6 (six) hours as needed for moderate pain.   loperamide 2 MG tablet Commonly known as: Imodium A-D Take 1 tablet (2 mg total) by mouth at bedtime.   traMADol 50 MG tablet Commonly known as: ULTRAM Take 1 tablet (50 mg total) by mouth every 6 (six) hours as needed (mild pain).       Follow-up Information    Leighton Ruff, MD. Schedule an appointment as soon as possible for a visit in 2 week(s).   Specialty: General Surgery Contact information: [address] [place] [address] [phone]               Signed: Joyice Faster Naol Ontiveros MD  04/28/2020, 11:12 AM

## 2020-04-28 NOTE — Plan of Care (Signed)
  Problem: Education: Goal: Knowledge of General Education information will improve Description: Including pain rating scale, medication(s)/side effects and non-pharmacologic comfort measures Outcome: Progressing   Problem: Health Behavior/Discharge Planning: Goal: Ability to manage health-related needs will improve Outcome: Progressing   Problem: Elimination: Goal: Will not experience complications related to bowel motility Outcome: Progressing   Problem: Pain Managment: Goal: General experience of comfort will improve Outcome: Progressing   Problem: Clinical Measurements: Goal: Postoperative complications will be avoided or minimized Outcome: Progressing

## 2020-04-29 LAB — SURGICAL PATHOLOGY

## 2020-07-29 ENCOUNTER — Other Ambulatory Visit: Payer: Self-pay

## 2020-07-29 ENCOUNTER — Emergency Department (HOSPITAL_COMMUNITY): Payer: No Typology Code available for payment source

## 2020-07-29 ENCOUNTER — Emergency Department (HOSPITAL_COMMUNITY)
Admission: EM | Admit: 2020-07-29 | Discharge: 2020-07-29 | Disposition: A | Discharge status: Home / Self Care | Payer: No Typology Code available for payment source | Attending: Emergency Medicine | Admitting: Emergency Medicine

## 2020-07-29 ENCOUNTER — Encounter (HOSPITAL_COMMUNITY): Payer: Self-pay

## 2020-07-29 DIAGNOSIS — R030 Elevated blood-pressure reading, without diagnosis of hypertension: Secondary | ICD-10-CM

## 2020-07-29 DIAGNOSIS — Z87891 Personal history of nicotine dependence: Secondary | ICD-10-CM | POA: Insufficient documentation

## 2020-07-29 DIAGNOSIS — Y92410 Unspecified street and highway as the place of occurrence of the external cause: Secondary | ICD-10-CM | POA: Insufficient documentation

## 2020-07-29 DIAGNOSIS — R519 Headache, unspecified: Secondary | ICD-10-CM | POA: Diagnosis not present

## 2020-07-29 DIAGNOSIS — Z85048 Personal history of other malignant neoplasm of rectum, rectosigmoid junction, and anus: Secondary | ICD-10-CM | POA: Diagnosis not present

## 2020-07-29 DIAGNOSIS — J4521 Mild intermittent asthma with (acute) exacerbation: Secondary | ICD-10-CM | POA: Diagnosis not present

## 2020-07-29 DIAGNOSIS — I10 Essential (primary) hypertension: Secondary | ICD-10-CM | POA: Diagnosis not present

## 2020-07-29 MED ORDER — METHOCARBAMOL 500 MG PO TABS
500.0000 mg | ORAL_TABLET | Freq: Once | ORAL | Status: AC
  Administered 2020-07-29: 16:00:00 500 mg via ORAL
  Filled 2020-07-29: qty 1

## 2020-07-29 MED ORDER — ALBUTEROL SULFATE HFA 108 (90 BASE) MCG/ACT IN AERS: 2.0000 | INHALATION_SPRAY | Freq: Four times a day (QID) | RESPIRATORY_TRACT | 0 refills | Status: DC | PRN

## 2020-07-29 MED ORDER — ALBUTEROL SULFATE HFA 108 (90 BASE) MCG/ACT IN AERS
4.0000 | INHALATION_SPRAY | Freq: Once | RESPIRATORY_TRACT | Status: AC
  Administered 2020-07-29: 16:00:00 4 via RESPIRATORY_TRACT
  Filled 2020-07-29: qty 6.7

## 2020-07-29 MED ORDER — ACETAMINOPHEN 325 MG PO TABS
650.0000 mg | ORAL_TABLET | Freq: Once | ORAL | Status: AC
  Administered 2020-07-29: 16:00:00 650 mg via ORAL
  Filled 2020-07-29: qty 2

## 2020-07-29 MED ORDER — PREDNISONE 20 MG PO TABS: 20.0000 mg | ORAL_TABLET | Freq: Every day | ORAL | 0 refills | Status: AC

## 2020-07-29 MED ORDER — ACETAMINOPHEN ER 650 MG PO TBCR: 650.0000 mg | EXTENDED_RELEASE_TABLET | Freq: Three times a day (TID) | ORAL | 0 refills | Status: DC | PRN

## 2020-07-29 MED ORDER — AEROCHAMBER Z-STAT PLUS/MEDIUM MISC
1.0000 | Freq: Once | Status: AC
  Administered 2020-07-29: 16:00:00 1
  Filled 2020-07-29: qty 1

## 2020-07-29 MED ORDER — METHOCARBAMOL 500 MG PO TABS: 500.0000 mg | ORAL_TABLET | Freq: Two times a day (BID) | ORAL | 0 refills | Status: DC

## 2020-07-29 MED ORDER — LIDOCAINE 5 % EX PTCH
2.0000 | MEDICATED_PATCH | CUTANEOUS | Status: DC
  Administered 2020-07-29: 16:00:00 2 via TRANSDERMAL
  Filled 2020-07-29: qty 2
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
## 2020-08-14 ENCOUNTER — Telehealth (INDEPENDENT_AMBULATORY_CARE_PROVIDER_SITE_OTHER): Payer: Medicare Other | Admitting: Internal Medicine

## 2020-08-14 ENCOUNTER — Other Ambulatory Visit: Payer: Self-pay

## 2020-08-14 DIAGNOSIS — J4520 Mild intermittent asthma, uncomplicated: Secondary | ICD-10-CM | POA: Diagnosis not present

## 2020-08-14 DIAGNOSIS — C20 Malignant neoplasm of rectum: Secondary | ICD-10-CM | POA: Diagnosis not present
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
## 2020-11-06 ENCOUNTER — Other Ambulatory Visit: Payer: Self-pay

## 2020-11-07 ENCOUNTER — Ambulatory Visit (INDEPENDENT_AMBULATORY_CARE_PROVIDER_SITE_OTHER): Payer: Medicare Other | Admitting: Internal Medicine

## 2020-11-07 ENCOUNTER — Encounter: Payer: Self-pay | Admitting: Internal Medicine

## 2020-11-07 VITALS — BP 150/90 | HR 73 | Temp 98.0°F | Wt 195.3 lb

## 2020-11-07 DIAGNOSIS — R21 Rash and other nonspecific skin eruption: Secondary | ICD-10-CM | POA: Diagnosis not present

## 2020-11-07 MED ORDER — TRIAMCINOLONE ACETONIDE 0.1 % EX CREA: 1.0000 "application " | TOPICAL_CREAM | Freq: Two times a day (BID) | CUTANEOUS | 0 refills | Status: DC

## 2020-11-07 NOTE — Progress Notes (Signed)
Established Patient Office Visit     This visit occurred during the SARS-CoV-2 public health emergency.  Safety protocols were in place, including screening questions prior to the visit, additional usage of staff PPE, and extensive cleaning of exam room while observing appropriate contact time as indicated for disinfecting solutions.    CC/Reason for Visit: Rash  HPI: Laura Mcdonald is a 65 y.o. female who is coming in today for the above mentioned reasons.  She is here today for acute onset of a rash 2 days ago.  Rash started over her knee, but has now spread to her chest and back.  Rash is a fine red papular rash, is pruritic.  No new soaps, laundry detergent, perfumes, lotions.  No contact with poison oak/ivy that she is aware of.  Past Medical/Surgical History: Past Medical History:  Diagnosis Date  . Allergy   . Cancer ([place])    cervical anc rectal  . GERD (gastroesophageal reflux disease)    past hx but better wirg weight loss   . History of kidney stones    seen on a scan no treatment   . Mild intermittent asthma    seasonal or allery induced    Past Surgical History:  Procedure Laterality Date  . BIOPSY  01/15/2020   Procedure: BIOPSY;  Surgeon: Rush Landmark Telford Nab., MD;  Location: [facility];  Service: Gastroenterology;;  . cervical cancer surgery  1995~   Dr Stann Mainland   . COLONOSCOPY WITH PROPOFOL N/A 01/15/2020   Procedure: COLONOSCOPY WITH PROPOFOL;  Surgeon: Rush Landmark Telford Nab., MD;  Location: [facility];  Service: Gastroenterology;  Laterality: N/A;  . COLOSTOMY REVERSAL N/A 04/26/2020   Procedure: ILEOSTOMY REVERSAL;  Surgeon: Leighton Ruff, MD;  [facility];  Service: General;  Laterality: N/A;  . HAND SURGERY Right   . SUBMUCOSAL LIFTING INJECTION  01/15/2020   Procedure: SUBMUCOSAL LIFTING INJECTION;  Surgeon: Rush Landmark Telford Nab., MD;  Location: [facility];  Service: Gastroenterology;;  . tubal preganancy    . XI ROBOTIC ASSISTED LOWER  ANTERIOR RESECTION N/A 02/15/2020   Procedure: ROBOTIC LOW ANTERIOR RESECTION, DIVERTING ILEOSTOMY;  Surgeon: Leighton Ruff, MD;  [facility];  Service: General;  Laterality: N/A;    Social History:  reports that she quit smoking about 37 years ago. She has a 5.00 pack-year smoking history. She has never used smokeless tobacco. She reports that she does not drink alcohol and does not use drugs.  Allergies: No Known Allergies  Family History:  Family History  Problem Relation Age of Onset  . CVA Mother   . Lung cancer Father   . Colon polyps Sister   . Cancer Brother 40       Prostate cancer   . Esophageal cancer Daughter   . Colon cancer Neg Hx   . Stomach cancer Neg Hx   . Rectal cancer Neg Hx      Current Outpatient Medications:  .  albuterol (VENTOLIN HFA) 108 (90 Base) MCG/ACT inhaler, Inhale 2 puffs into the lungs every 6 (six) hours as needed for wheezing or shortness of breath. , Disp: , Rfl:  .  APPLE CIDER VINEGAR PO, Take 1 tablet by mouth daily. gummies a day, Disp: , Rfl:  .  BLACK ELDERBERRY PO, Take 500 mg by mouth daily. , Disp: , Rfl:  .  triamcinolone (KENALOG) 0.1 %, Apply 1 application topically 2 (two) times daily., Disp: 30 g, Rfl: 0  Review of Systems:  Constitutional: Denies fever, chills,  diaphoresis, appetite change and fatigue.  HEENT: Denies photophobia, eye pain, redness, hearing loss, ear pain, congestion, sore throat, rhinorrhea, sneezing, mouth sores, trouble swallowing, neck pain, neck stiffness and tinnitus.   Respiratory: Denies SOB, DOE, cough, chest tightness,  and wheezing.   Cardiovascular: Denies chest pain, palpitations and leg swelling.  Gastrointestinal: Denies nausea, vomiting, abdominal pain, diarrhea, constipation, blood in stool and abdominal distention.  Genitourinary: Denies dysuria, urgency, frequency, hematuria, flank pain and difficulty urinating.  Endocrine: Denies: hot or cold intolerance, sweats, changes in hair or nails,  polyuria, polydipsia. Musculoskeletal: Denies myalgias, back pain, joint swelling, arthralgias and gait problem.  Skin: Denies pallor and wound.  Neurological: Denies dizziness, seizures, syncope, weakness, light-headedness, numbness and headaches.  Hematological: Denies adenopathy. Easy bruising, personal or family bleeding history  Psychiatric/Behavioral: Denies suicidal ideation, mood changes, confusion, nervousness, sleep disturbance and agitation    Physical Exam: Vitals:   11/07/20 1019  BP: (!) 150/90  Pulse: 73  Temp: 98 F (36.7 C)  TempSrc: Oral  SpO2: 96%  Weight: 195 lb 4.8 oz (88.6 kg)    Body mass index is 32.01 kg/m.   Constitutional: NAD, calm, comfortable Eyes: PERRL, lids and conjunctivae normal ENMT: Mucous membranes are moist.  Skin: Fine, red, papular rash over torso and upper back, neck Neurologic: Grossly intact and nonfocal Psychiatric: Normal judgment and insight. Alert and oriented x 3. Normal mood.    Impression and Plan:  Rash -Etiology not entirely clear, suspect contact dermatitis of some sort. -Have advised Benadryl at nighttime and triamcinolone cream to apply twice daily to affected areas. -She knows to contact us in 10 to 14 days if no improvement.    Patient Instructions  -Nice seeing you today!!  -Apply triamcinolone cream to affected areas twice daily.  -Take a benadryl at nighttime for the next 5-7 days.     Lelon Frohlich, MD [facility]

## 2020-11-07 NOTE — Patient Instructions (Signed)
-  Nice seeing you today!!  -Apply triamcinolone cream to affected areas twice daily.  -Take a benadryl at nighttime for the next 5-7 days.

## 2020-11-08 DIAGNOSIS — C20 Malignant neoplasm of rectum: Secondary | ICD-10-CM | POA: Diagnosis not present

## 2020-12-30 DIAGNOSIS — C20 Malignant neoplasm of rectum: Secondary | ICD-10-CM | POA: Diagnosis not present

## 2021-01-01 ENCOUNTER — Telehealth: Payer: Self-pay | Admitting: Gastroenterology

## 2021-01-01 NOTE — Telephone Encounter (Signed)
Left message to call back  

## 2021-01-01 NOTE — Telephone Encounter (Signed)
Laura Mcdonald with Dr. Marcello Moores' office called. She stated that Dr. Marcello Moores is referring pt back for a repeat colon. Per Laura Mcdonald, pt had reversal last September so Dr. Marcello Moores wants pt to have one year survelliance in June. Any questions please call Laura Mcdonald at [phone].

## 2021-01-01 NOTE — Telephone Encounter (Signed)
Laura Mcdonald can you please schedule an office visit with Dr Silverio Decamp to discuss? Dr Rush Landmark only did the rectal EUS.

## 2021-01-07 ENCOUNTER — Other Ambulatory Visit: Payer: Self-pay | Admitting: General Surgery

## 2021-01-07 DIAGNOSIS — C20 Malignant neoplasm of rectum: Secondary | ICD-10-CM

## 2021-01-08 IMAGING — MR MR PELVIS WO/W CM
9 of 10 series · 41 of 48 positions shown · IV contrast (gadavist)
Comparison: CT on 01/24/2020

CLINICAL DATA: Rectal tubular adenoma with high-grade dysplasia on
recent colonoscopy. Recent excisional biopsy.

EXAM:
MRI PELVIS WITHOUT AND WITH CONTRAST
TECHNIQUE: Multiplanar multisequence MR imaging of the pelvis was performed
both before and after administration of intravenous contrast. Small
amount of US gel was administered per rectum to optimize tumor
evaluation.
CONTRAST:  9mL GADAVIST GADOBUTROL 1 MMOL/ML IV SOLN

[Series 1: 3 plane loc · axial · 3.0mm · 1.56mm/px · z∈[-18,+200]mm · 2 of 27 slices shown]
[im 1/27]
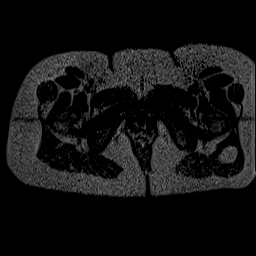
[im 27/27]
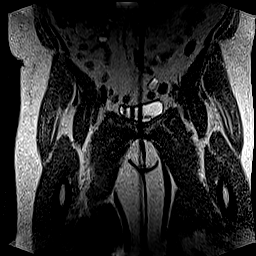

[Series 4: T2 · coronal · 3.0mm · 0.74mm/px · 4 of 48 slices shown (1 of 5)]
[im 1/48]
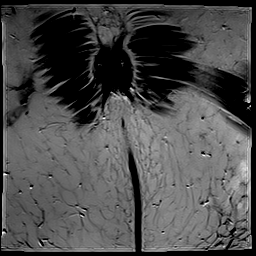
[im 16/48]
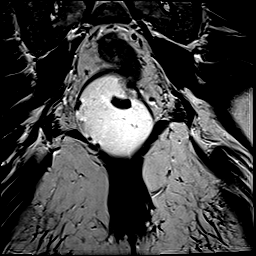
[im 32/48]
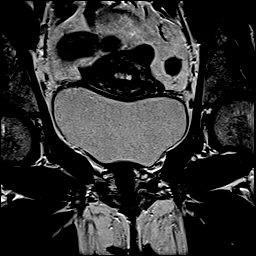
[im 48/48]
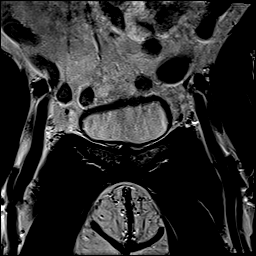

[Series 5: T2 · sagittal · 3.0mm · 0.74mm/px · 5 of 48 slices shown (2 of 5)]
[im 1/48]
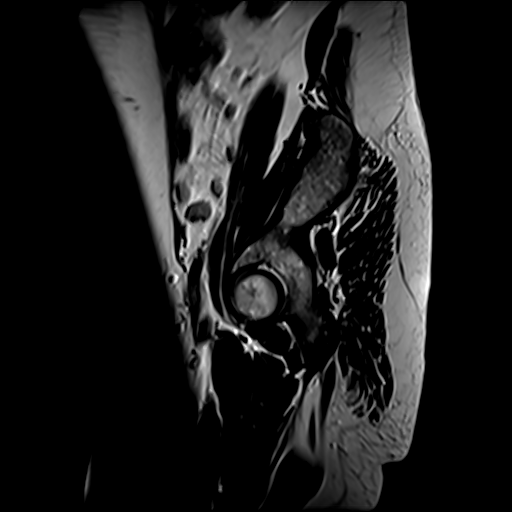
[im 12/48]
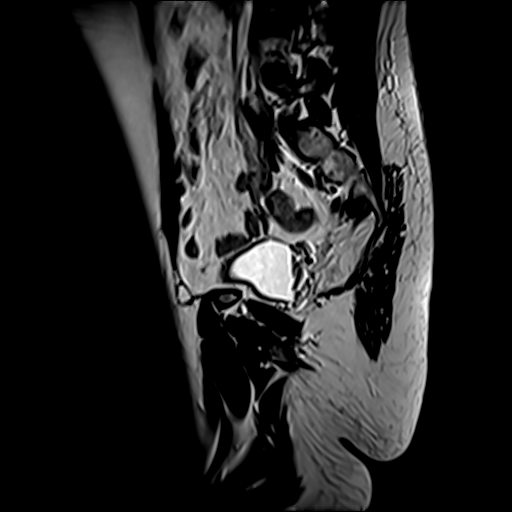
[im 24/48]
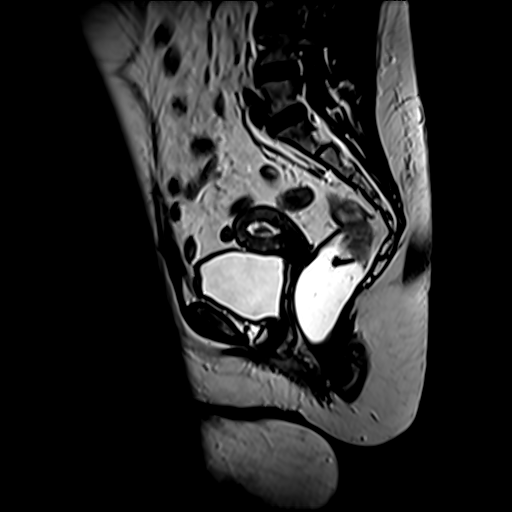
[im 36/48]
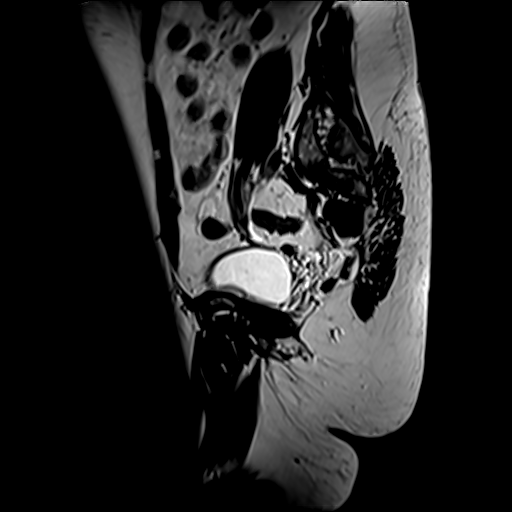
[im 48/48]
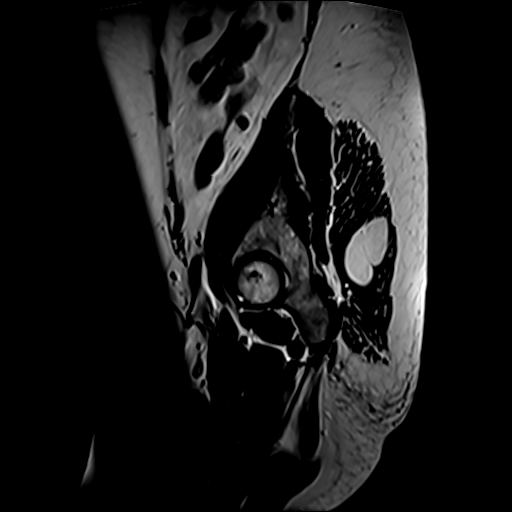

[Series 6: DWI · axial · 5.0mm · 1.48mm/px · z∈[-106,+152]mm · 9 of 88 slices shown (1 of 2)]
[im 1/88]
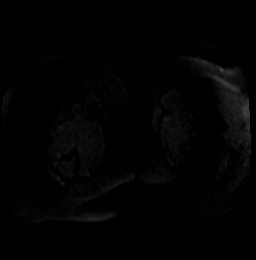
[im 11/88]
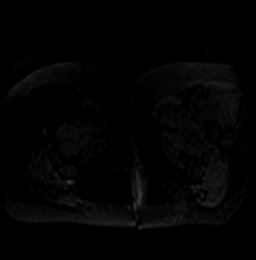
[im 22/88]
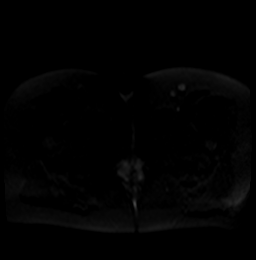
[im 33/88]
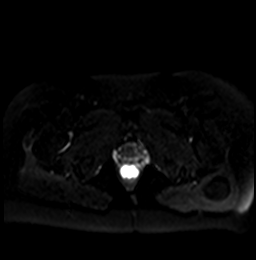
[im 44/88]
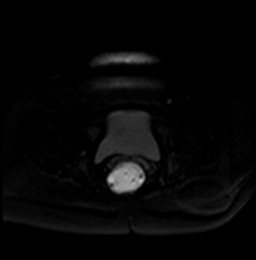
[im 55/88]
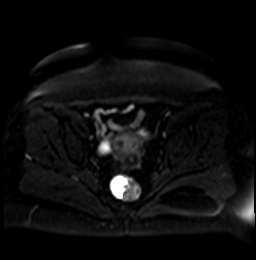
[im 66/88]
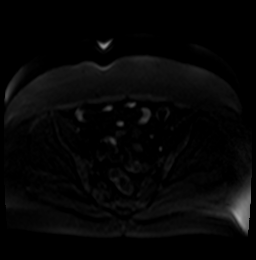
[im 77/88]
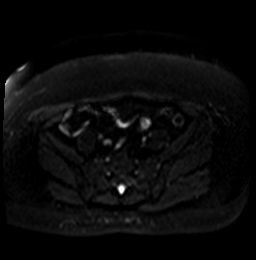
[im 88/88]
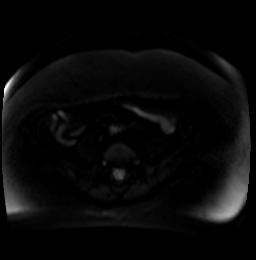

[Series 7: DWI · axial · 5.0mm · 1.48mm/px · z∈[-106,+152]mm · 4 of 44 slices shown (2 of 2)]
[im 1/44]
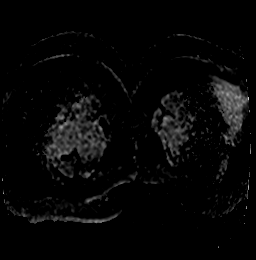
[im 15/44]
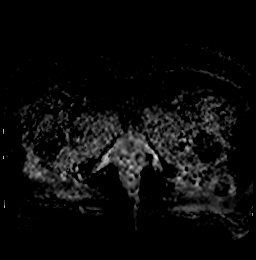
[im 29/44]
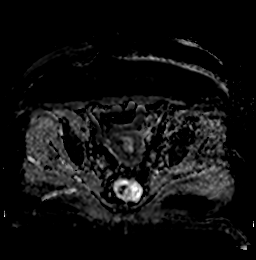
[im 44/44]
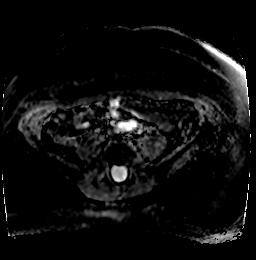

[Series 8: T2 · oblique · 3.3mm · 0.70mm/px · 5 of 50 slices shown (3 of 5)]
[im 1/50]
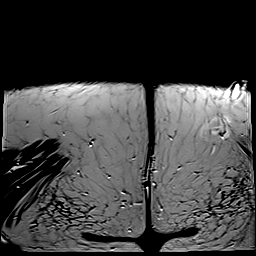
[im 13/50]
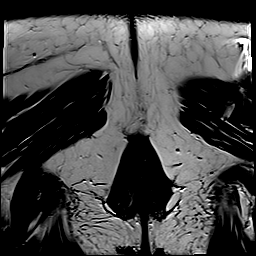
[im 25/50]
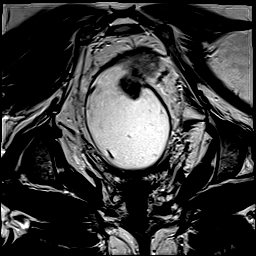
[im 37/50]
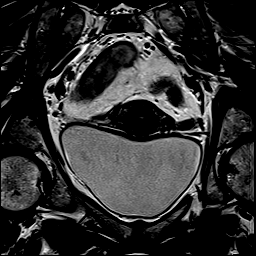
[im 50/50]
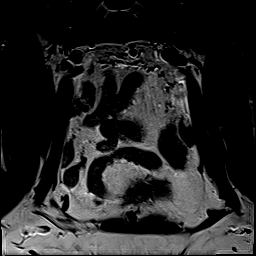

[Series 9: T2 · axial · 5.0mm · 1.48mm/px · z∈[-86,+148]mm · 4 of 40 slices shown (4 of 5)]
[im 1/40]
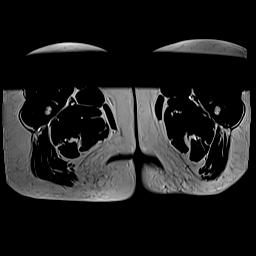
[im 14/40]
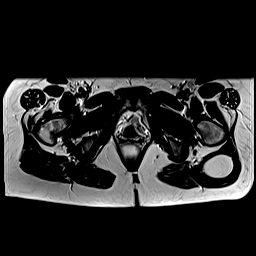
[im 27/40]
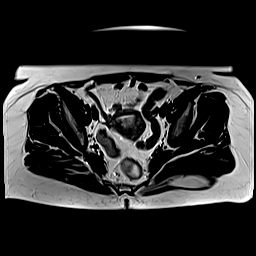
[im 40/40]
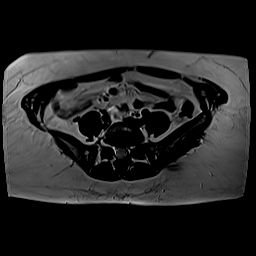

[Series 10: T2 · oblique · 3.4mm · 0.70mm/px · 5 of 46 slices shown (5 of 5)]
[im 1/46]
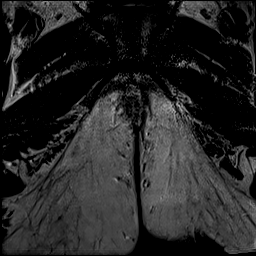
[im 12/46]
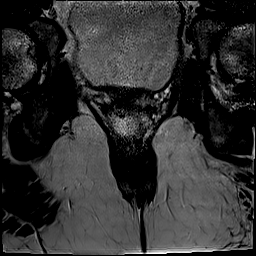
[im 23/46]
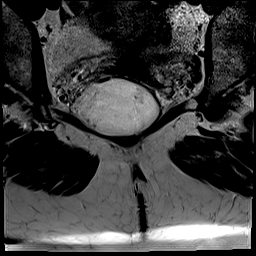
[im 34/46]
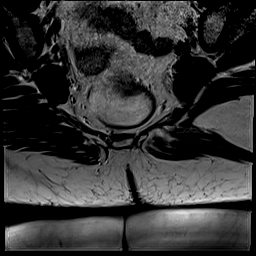
[im 46/46]
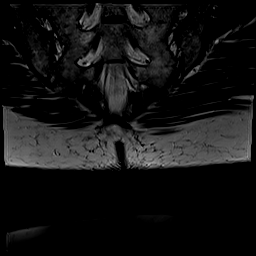

[Series 11: T1 fat-sat post-contrast · oblique · 3.3mm · 0.43mm/px · 3 of 50 slices shown]
[im 1/50]
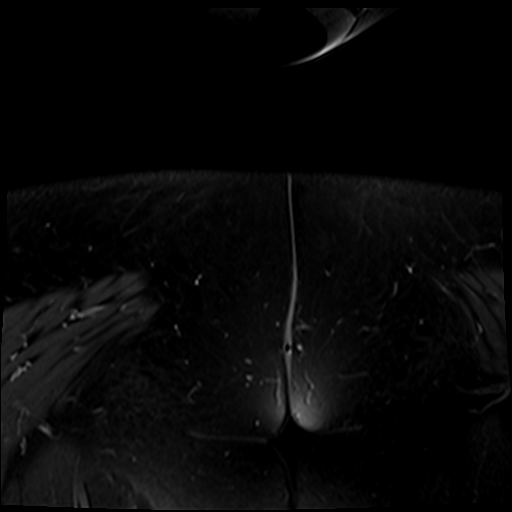
[im 13/50]
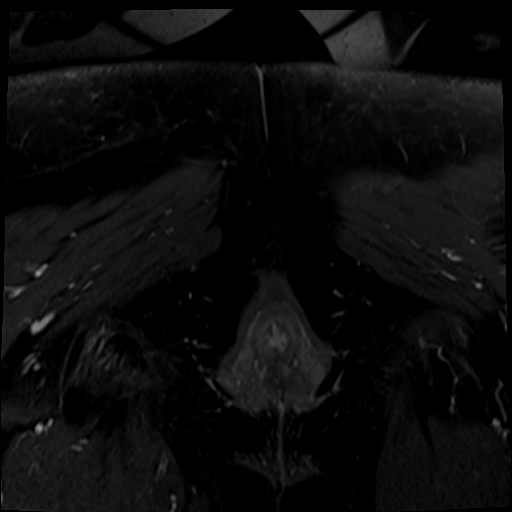
[im 25/50]
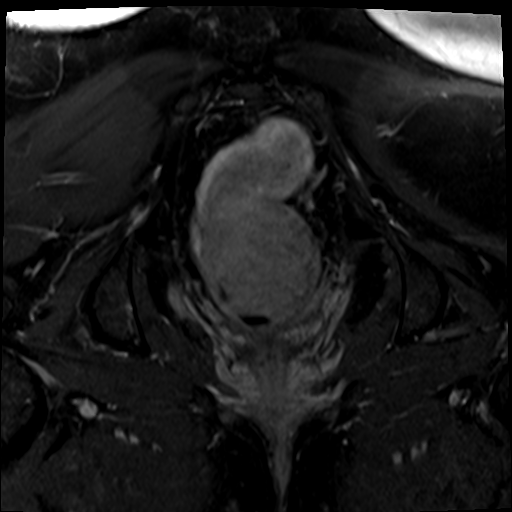

[41 of 48 positions shown; findings below may reference images not displayed]

FINDINGS: TUMOR LOCATION - a stricture is noted in the mid rectum, with
short-segment intraluminal soft tissue density at this site (e.g.
Image [DATE]). This is assumed to represent the site of the rectal
tumor.

Tumor distance from Anal Verge/Skin Surface:  10.9 cm

Tumor distance from Internal Anal Sphincter:  6.8 cm

TUMOR DESCRIPTION

Circumferential Extent: Not well assessed due to stricture and lack
of distension at tumor site

Tumor Length: 3.1 cm

T - CATEGORY

Extension through Muscularis Propria: None = T1/T2

Shortest Distance of any tumor/node from Mesorectal Fascia: 6 mm
along left lateral rectal wall

Extramural Vascular Invasion/Tumor Thrombus: No

Invasion of Anterior Peritoneal Reflection: No

Involvement of Adjacent Organs or Pelvic Sidewall: No

Levator Ani Involvement: No

N - CATEGORY

Mesorectal Lymph Nodes >=5mm: None=N0

Extra-mesorectal Lymphadenopathy: No

Other: 3 small uterine fibroids are seen measuring 1-2 cm in size.
Endometrial thickening is seen measuring up to 10 mm and there is a
polypoid area of T2 hypointensity in the central endometrial cavity,
suspicious for endometrial polyp. Adnexal regions are unremarkable.
IMPRESSION: Rectal tumor T stage: T1/T2

Rectal tumor N stage:  N0

Distance from tumor to the internal anal sphincter is 6.8 cm.

Abnormal endometrial thickening of 10 mm with suspected endometrial
polyp. Endometrial carcinoma cannot be excluded in a postmenopausal
female. GYN consultation and endometrial sampling is recommended.

## 2021-01-09 DIAGNOSIS — Z1231 Encounter for screening mammogram for malignant neoplasm of breast: Secondary | ICD-10-CM | POA: Diagnosis not present

## 2021-01-09 LAB — HM MAMMOGRAPHY

## 2021-01-16 ENCOUNTER — Encounter: Payer: Self-pay | Admitting: Internal Medicine

## 2021-03-10 ENCOUNTER — Telehealth: Payer: Self-pay | Admitting: Internal Medicine

## 2021-03-10 NOTE — Telephone Encounter (Signed)
Pt  would like a rx for a rescue inhaler    [facility] [phone] [address], [place] AT [address]  [place] Fax:  [phone]

## 2021-03-11 ENCOUNTER — Telehealth: Payer: Self-pay | Admitting: Internal Medicine

## 2021-03-11 MED ORDER — ALBUTEROL SULFATE HFA 108 (90 BASE) MCG/ACT IN AERS: 2.0000 | INHALATION_SPRAY | Freq: Four times a day (QID) | RESPIRATORY_TRACT | 2 refills | Status: DC | PRN

## 2021-03-11 NOTE — Telephone Encounter (Signed)
Refill sent.

## 2021-03-11 NOTE — Telephone Encounter (Signed)
Office visit

## 2021-03-11 NOTE — Telephone Encounter (Signed)
Laura Mcdonald from [facility] called to advise that she is currently with the PT and she did the glucose levels and noticed that they are reading at a 6.6 which is a bit high. She would like to know if the PT Dr can followup on this and she can be reached at [phone].

## 2021-03-11 NOTE — Telephone Encounter (Signed)
Spoke with patient and a physical is scheduled.

## 2021-04-04 ENCOUNTER — Encounter: Payer: Self-pay | Admitting: Gastroenterology

## 2021-04-04 ENCOUNTER — Ambulatory Visit: Payer: Medicare Other | Admitting: Gastroenterology

## 2021-04-04 VITALS — BP 160/90 | HR 64 | Ht 65.5 in | Wt 198.4 lb

## 2021-04-04 DIAGNOSIS — R152 Fecal urgency: Secondary | ICD-10-CM

## 2021-04-04 DIAGNOSIS — Z85048 Personal history of other malignant neoplasm of rectum, rectosigmoid junction, and anus: Secondary | ICD-10-CM

## 2021-04-04 MED ORDER — SUTAB 1479-225-188 MG PO TABS: ORAL_TABLET | ORAL | 0 refills | Status: DC

## 2021-04-04 NOTE — Progress Notes (Signed)
Laura Mcdonald    [id number]    Apr 30, 1956  Primary Care Physician:Hernandez Everardo Beals, MD  Referring Physician: Isaac Bliss, Rayford Halsted, MD [place],  [place] [address]   Chief complaint:  H/o rectal cancer, fecal urgency and incontinence   HPI: 66 year old very pleasant female with history of rectal cancer s/p robotic low anterior resection with diverting ileostomy July 2021 and subsequent reversal in September 2021 here for follow-up visit  She is experiencing increased fecal urgency and frequency after reversal.  She is not tolerating salads, vegetables or fruits.  She is having on average 45 bowel movements per day.  She has to go to the bathroom every time she eats and has fecal incontinence when she tries to urinate  Denies any rectal bleeding or melena.  No abdominal pain.  Colonoscopy February 14, 2020 - The perianal and digital rectal examinations were normal. - Four sessile polyps were found in the rectum, sigmoid colon and hepatic flexure. The polyps were 5 to 11 mm in size. These polyps were removed with a cold snare. Resection and retrieval were complete. - Three sessile polyps were found in the transverse colon and ascending colon. The polyps were 1 to 2 mm in size. These polyps were removed with a cold biopsy forceps. Resection and retrieval were complete. - A 20 mm polypoid lesion was found in the rectum, 15cm from anal verge. The lesion was infiltrative. No bleeding was present. This was biopsied with a cold forceps for histology. Opposite fold area was tattooed with an injection of 1 mL of Spot (carbon black). - A few small-mouthed diverticula were found in the sigmoid colon. - Non-bleeding internal hemorrhoids were found during retroflexion. The hemorrhoids were small.  A. COLON, ASCENDING AND TRANSVERSE, POLYPECTOMY:  - Sessile serrated polyp(s) without cytologic dysplasia.  - Diminutive inflammatory polyp.  - Detached  fragment of unremarkable stroma.   B. RECTUM, MASS, BIOPSY:  - At least intramucosal adenocarcinoma arising in a background of high  grade dysplasia  Outpatient Encounter Medications as of 04/04/2021  Medication Sig   albuterol (VENTOLIN HFA) 108 (90 Base) MCG/ACT inhaler Inhale 2 puffs into the lungs every 6 (six) hours as needed for wheezing or shortness of breath.   APPLE CIDER VINEGAR PO Take 1 tablet by mouth daily. gummies a day   BLACK ELDERBERRY PO Take 500 mg by mouth daily.    triamcinolone (KENALOG) 0.1 % Apply 1 application topically 2 (two) times daily.   No facility-administered encounter medications on file as of 04/04/2021.    Allergies as of 04/04/2021 - Review Complete 04/04/2021  Allergen Reaction Noted   Hydrocodone Itching 04/04/2021    Past Medical History:  Diagnosis Date   Allergy    Cancer ([place])    cervical anc rectal   GERD (gastroesophageal reflux disease)    past hx but better wirg weight loss    History of kidney stones    seen on a scan no treatment    Mild intermittent asthma    seasonal or allery induced    Past Surgical History:  Procedure Laterality Date   BIOPSY  01/15/2020   Procedure: BIOPSY;  Surgeon: Irving Copas., MD;  Location: [facility];  Service: Gastroenterology;;   cervical cancer surgery  1995~   Dr Stann Mainland; "[facility] surgery"   COLONOSCOPY WITH PROPOFOL N/A 01/15/2020   Procedure: COLONOSCOPY WITH PROPOFOL;  Surgeon: Irving Copas., MD;  Location:  [facility];  Service: Gastroenterology;  Laterality: N/A;   COLOSTOMY REVERSAL N/A 04/26/2020   Procedure: ILEOSTOMY REVERSAL;  Surgeon: Leighton Ruff, MD;  Location: [facility];  Service: General;  Laterality: N/A;   HAND SURGERY Right    SUBMUCOSAL LIFTING INJECTION  01/15/2020   Procedure: SUBMUCOSAL LIFTING INJECTION;  Surgeon: Irving Copas., MD;  Location: [facility];  Service: Gastroenterology;;   tubal preganancy     XI ROBOTIC ASSISTED LOWER  ANTERIOR RESECTION N/A 02/15/2020   Procedure: ROBOTIC LOW ANTERIOR RESECTION, DIVERTING ILEOSTOMY;  Surgeon: Leighton Ruff, MD;  Location: [facility];  Service: General;  Laterality: N/A;    Family History  Problem Relation Age of Onset   CVA Mother    Lung cancer Father    Colon polyps Sister    Colon cancer Sister    Diverticulitis Sister    Cancer Brother 72       Prostate cancer    Esophageal cancer Daughter    Stomach cancer Neg Hx    Rectal cancer Neg Hx    Pancreatic cancer Neg Hx    Liver disease Neg Hx     Social History   Socioeconomic History   Marital status: Married    Spouse name: Not on file   Number of children: 5   Years of education: 14   Highest education level: Not on file  Occupational History   Occupation: health care aid   Tobacco Use   Smoking status: Former    Packs/day: 0.25    Years: 20.00    Pack years: 5.00    Types: Cigarettes    Quit date: 08/11/1983    Years since quitting: 37.6   Smokeless tobacco: Never  Vaping Use   Vaping Use: Never used  Substance and Sexual Activity   Alcohol use: Never   Drug use: Never   Sexual activity: Not Currently    Birth control/protection: None, Post-menopausal  Other Topics Concern   Not on file  Social History Narrative   Not on file   Social Determinants of Health   Financial Resource Strain: Not on file  Food Insecurity: Not on file  Transportation Needs: Not on file  Physical Activity: Not on file  Stress: Not on file  Social Connections: Not on file  Intimate Partner Violence: Not on file      Review of systems: All other review of systems negative except as mentioned in the HPI.   Physical Exam: Vitals:   04/04/21 0909  BP: (!) 160/90  Pulse: 64   Body mass index is 32.51 kg/m. Gen:      No acute distress HEENT:  sclera anicteric Abd:      soft, non-tender; no palpable masses, no distension Ext:    No edema Neuro: alert and oriented x 3 Psych: normal mood and  affect  Data Reviewed:  Reviewed labs, radiology imaging, old records and pertinent past GI work up   Assessment and Plan/Recommendations:  65 year old very pleasant female with history of rectal cancer s/p robotic low anterior resection with diverting ileostomy July 2021 and subsequent reversal in September 2021   Due for surveillance colonoscopy, will schedule it The risks and benefits as well as alternatives of endoscopic procedure(s) have been discussed and reviewed. All questions answered. The patient agrees to proceed.  Increase fecal frequency and urgency s/p low anterior resection Add Benefiber or Citrucel 1 tablespoon 3 times daily with meals Advised patient to do Kegel exercises  If continues to have persistent symptoms  will refer to pelvic floor physical therapy for biofeedback and will also consider trial of cholestyramine for possible bile salt induced diarrhea  Return after colonoscopy  The patient was provided an opportunity to ask questions and all were answered. The patient agreed with the plan and demonstrated an understanding of the instructions.  Damaris Hippo , MD    CC: Isaac Bliss, [place]*

## 2021-04-04 NOTE — Patient Instructions (Signed)
You have been scheduled for a colonoscopy. Please follow written instructions given to you at your visit today.  Please pick up your prep supplies at the pharmacy within the next 1-3 days. If you use inhalers (even only as needed), please bring them with you on the day of your procedure.   Start Benefiber or Citrucel 1 tablespoon three times a day with meals  Start Kegel exercises   Kegel Exercises  Kegel exercises can help strengthen your pelvic floor muscles. The pelvic floor is a group of muscles that support your rectum, small intestine, and bladder. In females, pelvic floor muscles also help support the womb (uterus). These muscles help you control the flow of urine and stool. Kegel exercises are painless and simple, and they do not require any equipment. Your provider may suggest Kegel exercises to: Improve bladder and bowel control. Improve sexual response. Improve weak pelvic floor muscles after surgery to remove the uterus (hysterectomy) or pregnancy (females). Improve weak pelvic floor muscles after prostate gland removal or surgery (males). Kegel exercises involve squeezing your pelvic floor muscles, which are the same muscles you squeeze when you try to stop the flow of urine or keep from passing gas. The exercises can be done while sitting, standing, or lying down, but itis best to vary your position. Exercises How to do Kegel exercises: Squeeze your pelvic floor muscles tight. You should feel a tight lift in your rectal area. If you are a female, you should also feel a tightness in your vaginal area. Keep your stomach, buttocks, and legs relaxed. Hold the muscles tight for up to 10 seconds. Breathe normally. Relax your muscles. Repeat as told by your health care provider. Repeat this exercise daily as told by your health care provider. Continue to do this exercise for at least 4-6 weeks, or for as long as told by your healthcare provider. You may be referred to a physical  therapist who can help you learn more abouthow to do Kegel exercises. Depending on your condition, your health care provider may recommend: Varying how long you squeeze your muscles. Doing several sets of exercises every day. Doing exercises for several weeks. Making Kegel exercises a part of your regular exercise routine. This information is not intended to replace advice given to you by your health care provider. Make sure you discuss any questions you have with your healthcare provider. Document Revised: 07/17/2020 Document Reviewed: 03/16/2018 Elsevier Patient Education  2022 [employer].  Due to recent changes in healthcare laws, you may see the results of your imaging and laboratory studies on MyChart before your provider has had a chance to review them.  We understand that in some cases there may be results that are confusing or concerning to you. Not all laboratory results come back in the same time frame and the provider may be waiting for multiple results in order to interpret others.  Please give [place] 48 hours in order for your provider to thoroughly review all the results before contacting the office for clarification of your results.    Thank you for choosing [facility]  Karleen Hampshire Nandigam,MD

## 2021-04-08 ENCOUNTER — Telehealth: Payer: Self-pay | Admitting: Gastroenterology

## 2021-04-08 NOTE — Telephone Encounter (Signed)
Inbound call from patient. Have procedure 9/12. States sutab is over $100. Is asking if there can be a coupon or prescribe something more affordable.

## 2021-04-08 NOTE — Telephone Encounter (Signed)
I will check to see if we have samples

## 2021-04-09 NOTE — Telephone Encounter (Signed)
We will have samples in in about a week  Sutab

## 2021-04-16 NOTE — Telephone Encounter (Signed)
Magda Paganini, Do you know when we are getting samples in  Thanks

## 2021-04-17 NOTE — Telephone Encounter (Signed)
Sample given to [facility]

## 2021-04-17 NOTE — Telephone Encounter (Signed)
Patient called to check status of Sutab sample has procedure schedule for Monday 04/21/21.

## 2021-04-17 NOTE — Telephone Encounter (Signed)
Called pt to inform to pick up sample , she is coming today

## 2021-04-21 ENCOUNTER — Ambulatory Visit (AMBULATORY_SURGERY_CENTER): Payer: Medicare Other | Admitting: Gastroenterology

## 2021-04-21 ENCOUNTER — Encounter: Payer: Self-pay | Admitting: Gastroenterology

## 2021-04-21 ENCOUNTER — Other Ambulatory Visit: Payer: Self-pay

## 2021-04-21 VITALS — BP 154/89 | HR 60 | Temp 97.0°F | Resp 16

## 2021-04-21 DIAGNOSIS — D123 Benign neoplasm of transverse colon: Secondary | ICD-10-CM | POA: Diagnosis not present

## 2021-04-21 DIAGNOSIS — K6289 Other specified diseases of anus and rectum: Secondary | ICD-10-CM | POA: Diagnosis not present

## 2021-04-21 DIAGNOSIS — K635 Polyp of colon: Secondary | ICD-10-CM | POA: Diagnosis not present

## 2021-04-21 DIAGNOSIS — Z85048 Personal history of other malignant neoplasm of rectum, rectosigmoid junction, and anus: Secondary | ICD-10-CM | POA: Diagnosis not present

## 2021-04-21 MED ORDER — SODIUM CHLORIDE 0.9 % IV SOLN: 500.0000 mL | Freq: Once | INTRAVENOUS | Status: AC

## 2021-04-21 NOTE — Progress Notes (Signed)
To PACU, VSS. Report to [email or web address] 

## 2021-04-21 NOTE — Op Note (Signed)
[place] Patient Name: Laura Mcdonald Procedure Date: 04/21/2021 7:26 AM MRN: [id number] Endoscopist: Mauri Pole , MD Age: 65 Referring MD:  Date of Birth: 07-25-1956 Gender: Female Account #: [id number] Procedure:                Colonoscopy Indications:              High risk colon cancer surveillance: Personal                            history of colon cancer Medicines:                Monitored Anesthesia Care Procedure:                Pre-Anesthesia Assessment:                           - Prior to the procedure, a History and Physical                            was performed, and patient medications and                            allergies were reviewed. The patient's tolerance of                            previous anesthesia was also reviewed. The risks                            and benefits of the procedure and the sedation                            options and risks were discussed with the patient.                            All questions were answered, and informed consent                            was obtained. Prior Anticoagulants: The patient has                            taken no previous anticoagulant or antiplatelet                            agents. ASA Grade Assessment: III - A patient with                            severe systemic disease. After reviewing the risks                            and benefits, the patient was deemed in                            satisfactory condition to undergo the procedure.  After obtaining informed consent, the colonoscope                            was passed under direct vision. Throughout the                            procedure, the patient's blood pressure, pulse, and                            oxygen saturations were monitored continuously. The                            Olympus [id number]-H190DL [id number]) Colonoscope was                            introduced through the anus and  advanced to the the                            cecum, identified by appendiceal orifice and                            ileocecal valve. The colonoscopy was performed                            without difficulty. The patient tolerated the                            procedure well. The quality of the bowel                            preparation was excellent. The ileocecal valve,                            appendiceal orifice, and rectum were photographed. Scope In: 8:23:05 AM Scope Out: 8:37:51 AM Scope Withdrawal Time: 0 hours 12 minutes 25 seconds  Total Procedure Duration: 0 hours 14 minutes 46 seconds  Findings:                 The perianal and digital rectal examinations were                            normal.                           Two sessile polyps were found in the transverse                            colon. The polyps were 4 to 6 mm in size. These                            polyps were removed with a cold snare. Resection                            and retrieval were complete.  There was evidence of a prior functional end-to-end                            colo-colonic anastomosis in the proximal rectum.                            This was patent and was characterized by an intact                            staple line, mild mucosal changes with granular                            tissue near anastomosis. The anastomosis was                            traversed. Biopsies were taken with a cold forceps                            for histology.                           Non-bleeding external and internal hemorrhoids were                            found during retroflexion. The hemorrhoids were                            medium-sized. Complications:            No immediate complications. Estimated Blood Loss:     Estimated blood loss was minimal. Impression:               - Two 4 to 6 mm polyps in the transverse colon,                             removed with a cold snare. Resected and retrieved.                           - Patent functional end-to-end colo-colonic                            anastomosis, characterized by an intact staple                            line, mild mucosaal changes with granular tissue                            near anastomos. Biopsied.                           - Non-bleeding external and internal hemorrhoids. Recommendation:           - Patient has a contact number available for                            emergencies. The signs and  symptoms of potential                            delayed complications were discussed with the                            patient. Return to normal activities tomorrow.                            Written discharge instructions were provided to the                            patient.                           - Resume previous diet.                           - Continue present medications.                           - Await pathology results.                           - Repeat colonoscopy in 3 years for surveillance                            based on pathology results. Mauri Pole, MD 04/21/2021 8:52:41 AM This report has been signed electronically.

## 2021-04-21 NOTE — Progress Notes (Signed)
Vitals-CW  History reviewed.  Patient has very difficult veins.  CRNA Tommi Rumps inserted in L hand with 24G.  Called to room to assist during endoscopic procedure.  Patient ID and intended procedure confirmed with present staff. Received instructions for my participation in the procedure from the performing physician.

## 2021-04-21 NOTE — Progress Notes (Signed)
Please refer to office visit note 04/04/21. No additional changes in H&P Patient is appropriate for planned procedure(s) and anesthesia in an ambulatory setting  K. Denzil Magnuson , MD [phone]

## 2021-04-21 NOTE — Patient Instructions (Signed)
Resume previous diet and medications. Awaiting pathology results. Repeat colonoscopy in 3 years based on pathology results.   YOU HAD AN ENDOSCOPIC PROCEDURE TODAY AT [facility]:   Refer to the procedure report that was given to you for any specific questions about what was found during the examination.  If the procedure report does not answer your questions, please call your gastroenterologist to clarify.  If you requested that your care partner not be given the details of your procedure findings, then the procedure report has been included in a sealed envelope for you to review at your convenience later.  YOU SHOULD EXPECT: Some feelings of bloating in the abdomen. Passage of more gas than usual.  Walking can help get rid of the air that was put into your GI tract during the procedure and reduce the bloating. If you had a lower endoscopy (such as a colonoscopy or flexible sigmoidoscopy) you may notice spotting of blood in your stool or on the toilet paper. If you underwent a bowel prep for your procedure, you may not have a normal bowel movement for a few days.  Please Note:  You might notice some irritation and congestion in your nose or some drainage.  This is from the oxygen used during your procedure.  There is no need for concern and it should clear up in a day or so.  SYMPTOMS TO REPORT IMMEDIATELY:  Following lower endoscopy (colonoscopy or flexible sigmoidoscopy):  Excessive amounts of blood in the stool  Significant tenderness or worsening of abdominal pains  Swelling of the abdomen that is new, acute  Fever of 100F or higher  For urgent or emergent issues, a gastroenterologist can be reached at any hour by calling [phone]. Do not use MyChart messaging for urgent concerns.    DIET:  We do recommend a small meal at first, but then you may proceed to your regular diet.  Drink plenty of fluids but you should avoid alcoholic beverages for 24 hours.  ACTIVITY:   You should plan to take it easy for the rest of today and you should NOT DRIVE or use heavy machinery until tomorrow (because of the sedation medicines used during the test).    FOLLOW UP: Our staff will call the number listed on your records 48-72 hours following your procedure to check on you and address any questions or concerns that you may have regarding the information given to you following your procedure. If we do not reach you, we will leave a message.  We will attempt to reach you two times.  During this call, we will ask if you have developed any symptoms of COVID 19. If you develop any symptoms (ie: fever, flu-like symptoms, shortness of breath, cough etc.) before then, please call [phone].  If you test positive for Covid 19 in the 2 weeks post procedure, please call and report this information to [place].    If any biopsies were taken you will be contacted by phone or by letter within the next 1-3 weeks.  Please call us at [phone] if you have not heard about the biopsies in 3 weeks.    SIGNATURES/CONFIDENTIALITY: You and/or your care partner have signed paperwork which will be entered into your electronic medical record.  These signatures attest to the fact that that the information above on your After Visit Summary has been reviewed and is understood.  Full responsibility of the confidentiality of this discharge information lies with you and/or your care-partner.

## 2021-04-23 ENCOUNTER — Telehealth: Payer: Self-pay

## 2021-04-23 NOTE — Telephone Encounter (Signed)
  Follow up Call-  Call back number 04/21/2021 12/13/2019  Post procedure Call Back phone  # [phone]  Permission to leave phone message Yes Yes  Some recent data might be hidden     Patient questions:  Do you have a fever, pain , or abdominal swelling? No. Pain Score  0 *  Have you tolerated food without any problems? Yes.    Have you been able to return to your normal activities? Yes.    Do you have any questions about your discharge instructions: Diet   No. Medications  No. Follow up visit  No.  Do you have questions or concerns about your Care? No.  Actions: * If pain score is 4 or above: No action needed, pain <4.  Have you developed a fever since your procedure? no  2.   Have you had an respiratory symptoms (SOB or cough) since your procedure? no  3.   Have you tested positive for COVID 19 since your procedure no  4.   Have you had any family members/close contacts diagnosed with the COVID 19 since your procedure?  no   If yes to any of these questions please route to Joylene John, RN and Joella Prince, RN

## 2021-04-27 ENCOUNTER — Encounter: Payer: Self-pay | Admitting: Gastroenterology

## 2021-04-28 ENCOUNTER — Telehealth: Payer: Self-pay | Admitting: *Deleted

## 2021-04-28 NOTE — Telephone Encounter (Signed)
Pt has upcoming CPE and will schedule her medicare wellness then

## 2021-04-30 ENCOUNTER — Encounter: Payer: Self-pay | Admitting: Gastroenterology

## 2021-05-07 ENCOUNTER — Encounter: Payer: Medicare Other | Admitting: Internal Medicine

## 2021-05-26 ENCOUNTER — Other Ambulatory Visit: Payer: Self-pay

## 2021-05-27 ENCOUNTER — Encounter: Payer: Self-pay | Admitting: Internal Medicine

## 2021-05-27 ENCOUNTER — Ambulatory Visit (INDEPENDENT_AMBULATORY_CARE_PROVIDER_SITE_OTHER): Payer: Medicare Other | Admitting: Internal Medicine

## 2021-05-27 ENCOUNTER — Ambulatory Visit (INDEPENDENT_AMBULATORY_CARE_PROVIDER_SITE_OTHER)
Admission: RE | Admit: 2021-05-27 | Discharge: 2021-05-27 | Disposition: A | Discharge status: Home / Self Care | Payer: Medicare Other | Source: Ambulatory Visit | Attending: Internal Medicine | Admitting: Internal Medicine

## 2021-05-27 VITALS — BP 120/84 | HR 69 | Temp 97.8°F | Ht 66.0 in | Wt 195.4 lb

## 2021-05-27 DIAGNOSIS — Z23 Encounter for immunization: Secondary | ICD-10-CM

## 2021-05-27 DIAGNOSIS — C20 Malignant neoplasm of rectum: Secondary | ICD-10-CM

## 2021-05-27 DIAGNOSIS — Z0000 Encounter for general adult medical examination without abnormal findings: Secondary | ICD-10-CM

## 2021-05-27 DIAGNOSIS — C539 Malignant neoplasm of cervix uteri, unspecified: Secondary | ICD-10-CM | POA: Diagnosis not present

## 2021-05-27 DIAGNOSIS — Z13820 Encounter for screening for osteoporosis: Secondary | ICD-10-CM | POA: Diagnosis not present

## 2021-05-27 DIAGNOSIS — J4520 Mild intermittent asthma, uncomplicated: Secondary | ICD-10-CM

## 2021-05-27 DIAGNOSIS — Z780 Asymptomatic menopausal state: Secondary | ICD-10-CM | POA: Diagnosis not present

## 2021-05-27 LAB — CBC WITH DIFFERENTIAL/PLATELET
Basophils Absolute: 0.1 10*3/uL (ref 0.0–0.1)
Basophils Relative: 1 % (ref 0.0–3.0)
Eosinophils Absolute: 0.8 10*3/uL — ABNORMAL HIGH (ref 0.0–0.7)
Eosinophils Relative: 11.2 % — ABNORMAL HIGH (ref 0.0–5.0)
HCT: 42.2 % (ref 36.0–46.0)
Hemoglobin: 13.8 g/dL (ref 12.0–15.0)
Lymphocytes Relative: 29.2 % (ref 12.0–46.0)
Lymphs Abs: 2.2 10*3/uL (ref 0.7–4.0)
MCHC: 32.7 g/dL (ref 30.0–36.0)
MCV: 86.9 fl (ref 78.0–100.0)
Monocytes Absolute: 0.4 10*3/uL (ref 0.1–1.0)
Monocytes Relative: 5.4 % (ref 3.0–12.0)
Neutro Abs: 3.9 10*3/uL (ref 1.4–7.7)
Neutrophils Relative %: 53.2 % (ref 43.0–77.0)
Platelets: 294 10*3/uL (ref 150.0–400.0)
RBC: 4.86 Mil/uL (ref 3.87–5.11)
RDW: 13.6 % (ref 11.5–15.5)
WBC: 7.4 10*3/uL (ref 4.0–10.5)

## 2021-05-27 LAB — LIPID PANEL
Cholesterol: 192 mg/dL (ref 0–200)
HDL: 63 mg/dL (ref >39.00)
LDL Cholesterol: 107 mg/dL — ABNORMAL HIGH (ref 0–99)
NonHDL: 128.54
Total CHOL/HDL Ratio: 3
Triglycerides: 110 mg/dL (ref 0.0–149.0)
VLDL: 22 mg/dL (ref 0.0–40.0)

## 2021-05-27 LAB — HEMOGLOBIN A1C: Hgb A1c MFr Bld: 5.8 % (ref 4.6–6.5)

## 2021-05-27 LAB — COMPREHENSIVE METABOLIC PANEL
ALT: 17 U/L (ref 0–35)
AST: 19 U/L (ref 0–37)
Albumin: 4.3 g/dL (ref 3.5–5.2)
Alkaline Phosphatase: 71 U/L (ref 39–117)
BUN: 13 mg/dL (ref 6–23)
CO2: 24 mEq/L (ref 19–32)
Calcium: 10.1 mg/dL (ref 8.4–10.5)
Chloride: 108 mEq/L (ref 96–112)
Creatinine, Ser: 0.85 mg/dL (ref 0.40–1.20)
GFR: 70.84 mL/min (ref >60.00)
Glucose, Bld: 97 mg/dL (ref 70–99)
Potassium: 4.1 mEq/L (ref 3.5–5.1)
Sodium: 140 mEq/L (ref 135–145)
Total Bilirubin: 0.5 mg/dL (ref 0.2–1.2)
Total Protein: 7.4 g/dL (ref 6.0–8.3)

## 2021-05-27 LAB — VITAMIN D 25 HYDROXY (VIT D DEFICIENCY, FRACTURES): VITD: 21.57 ng/mL — ABNORMAL LOW (ref 30.00–100.00)

## 2021-05-27 LAB — TSH: TSH: 2.61 u[IU]/mL (ref 0.35–5.50)

## 2021-05-27 LAB — VITAMIN B12: Vitamin B-12: 317 pg/mL (ref 211–911)

## 2021-05-27 MED ORDER — ALBUTEROL SULFATE HFA 108 (90 BASE) MCG/ACT IN AERS: 2.0000 | INHALATION_SPRAY | Freq: Four times a day (QID) | RESPIRATORY_TRACT | 2 refills | Status: DC | PRN

## 2021-05-27 NOTE — Patient Instructions (Signed)
-  Nice seeing you today!!  -Lab work today; will notify you once results are available.  -Flu and pneumonia vaccines today.  -Remember COVID booster, tetanus and shingles vaccines at your pharmacy.  -Schedule follow up in 1 year or sooner as needed.

## 2021-05-27 NOTE — Addendum Note (Signed)
Addended by: Amanda Cockayne on: 05/27/2021 07:46 AM   Modules accepted: Orders

## 2021-05-27 NOTE — Progress Notes (Signed)
Established Patient Office Visit     This visit occurred during the SARS-CoV-2 public health emergency.  Safety protocols were in place, including screening questions prior to the visit, additional usage of staff PPE, and extensive cleaning of exam room while observing appropriate contact time as indicated for disinfecting solutions.    CC/Reason for Visit: Annual preventive exam and subsequent Medicare wellness visit  HPI: Laura Mcdonald is a 65 y.o. female who is coming in today for the above mentioned reasons. Past Medical History is significant for: Mild intermittent asthma and rectal cancer.  Since I last saw her she has had colostomy reversal.  She has routine dental care, she has her eye appointment scheduled soon.  No perceived hearing issues she exercises by walking approximately 2 miles every day.  She is overdue for flu, pneumonia, COVID booster, Tdap and shingles vaccinations.  She had a colonoscopy in September 2022 and is a 3-year follow-up.  She had a negative mammogram in June 2022.  She has had a history of cervical cancer and sees her gynecologist, last Pap smear was in 2021.  She is due a DEXA scan.  No acute concerns or complaints today.   Past Medical/Surgical History: Past Medical History:  Diagnosis Date   Allergy    Cancer ([place])    cervical anc rectal   GERD (gastroesophageal reflux disease)    past hx but better wirg weight loss    History of kidney stones    seen on a scan no treatment    Mild intermittent asthma    seasonal or allery induced    Past Surgical History:  Procedure Laterality Date   BIOPSY  01/15/2020   Procedure: BIOPSY;  Surgeon: Rush Landmark Telford Nab., MD;  Location: [facility];  Service: Gastroenterology;;   cervical cancer surgery  1995~   Dr Stann Mainland; "WHIP surgery"   COLONOSCOPY WITH PROPOFOL N/A 01/15/2020   Procedure: COLONOSCOPY WITH PROPOFOL;  Surgeon: Irving Copas., MD;  Location: [facility];  Service:  Gastroenterology;  Laterality: N/A;   COLOSTOMY REVERSAL N/A 04/26/2020   Procedure: ILEOSTOMY REVERSAL;  Surgeon: Leighton Ruff, MD;  Location: [facility];  Service: General;  Laterality: N/A;   HAND SURGERY Right    SUBMUCOSAL LIFTING INJECTION  01/15/2020   Procedure: SUBMUCOSAL LIFTING INJECTION;  Surgeon: Irving Copas., MD;  Location: [facility];  Service: Gastroenterology;;   tubal preganancy     XI ROBOTIC ASSISTED LOWER ANTERIOR RESECTION N/A 02/15/2020   Procedure: ROBOTIC LOW ANTERIOR RESECTION, DIVERTING ILEOSTOMY;  Surgeon: Leighton Ruff, MD;  Location: [facility];  Service: General;  Laterality: N/A;    Social History:  reports that she quit smoking about 37 years ago. Her smoking use included cigarettes. She has a 5.00 pack-year smoking history. She has never used smokeless tobacco. She reports that she does not drink alcohol and does not use drugs.  Allergies: Allergies  Allergen Reactions   Hydrocodone Itching    With anxiety    Family History:  Family History  Problem Relation Age of Onset   CVA Mother    Lung cancer Father    Colon polyps Sister    Colon cancer Sister    Diverticulitis Sister    Cancer Brother 2       Prostate cancer    Esophageal cancer Daughter    Stomach cancer Neg Hx    Rectal cancer Neg Hx    Pancreatic cancer Neg Hx    Liver disease Neg Hx  Current Outpatient Medications:    APPLE CIDER VINEGAR PO, Take 1 tablet by mouth daily. gummies a day, Disp: , Rfl:    BLACK ELDERBERRY PO, Take 500 mg by mouth daily. , Disp: , Rfl:    loratadine (CLARITIN) 10 MG tablet, Take 10 mg by mouth daily., Disp: , Rfl:    Multiple Vitamin (MULTIVITAMIN WITH MINERALS) TABS tablet, Take 1 tablet by mouth daily., Disp: , Rfl:    albuterol (VENTOLIN HFA) 108 (90 Base) MCG/ACT inhaler, Inhale 2 puffs into the lungs every 6 (six) hours as needed for wheezing or shortness of breath., Disp: 18 g, Rfl: 2   triamcinolone (KENALOG) 0.1 %, Apply 1  application topically 2 (two) times daily. (Patient not taking: Reported on 05/27/2021), Disp: 30 g, Rfl: 0  Current Facility-Administered Medications:    0.9 %  sodium chloride infusion, 500 mL, Intravenous, Once, Nandigam, Kavitha V, MD  Review of Systems:  Constitutional: Denies fever, chills, diaphoresis, appetite change and fatigue.  HEENT: Denies photophobia, eye pain, redness, hearing loss, ear pain, congestion, sore throat, rhinorrhea, sneezing, mouth sores, trouble swallowing, neck pain, neck stiffness and tinnitus.   Respiratory: Denies SOB, DOE, cough, chest tightness,  and wheezing.   Cardiovascular: Denies chest pain, palpitations and leg swelling.  Gastrointestinal: Denies nausea, vomiting, abdominal pain, diarrhea, constipation, blood in stool and abdominal distention.  Genitourinary: Denies dysuria, urgency, frequency, hematuria, flank pain and difficulty urinating.  Endocrine: Denies: hot or cold intolerance, sweats, changes in hair or nails, polyuria, polydipsia. Musculoskeletal: Denies myalgias, back pain, joint swelling, arthralgias and gait problem.  Skin: Denies pallor, rash and wound.  Neurological: Denies dizziness, seizures, syncope, weakness, light-headedness, numbness and headaches.  Hematological: Denies adenopathy. Easy bruising, personal or family bleeding history  Psychiatric/Behavioral: Denies suicidal ideation, mood changes, confusion, nervousness, sleep disturbance and agitation    Physical Exam: Vitals:   05/27/21 0705  BP: 120/84  Pulse: 69  Temp: 97.8 F (36.6 C)  TempSrc: Oral  SpO2: 97%  Weight: 195 lb 6.4 oz (88.6 kg)  Height: 5\' 6"  (1.676 m)    Body mass index is 31.54 kg/m.   Constitutional: NAD, calm, comfortable Eyes: PERRL, lids and conjunctivae normal, wears corrective lenses ENMT: Mucous membranes are moist. Posterior pharynx clear of any exudate or lesions. Normal dentition. Tympanic membrane is pearly white, no erythema or  bulging. Neck: normal, supple, no masses, no thyromegaly Respiratory: clear to auscultation bilaterally, no wheezing, no crackles. Normal respiratory effort. No accessory muscle use.  Cardiovascular: Regular rate and rhythm, no murmurs / rubs / gallops. No extremity edema. 2+ pedal pulses. No carotid bruits.  Abdomen: no tenderness, no masses palpated. No hepatosplenomegaly. Bowel sounds positive.  Musculoskeletal: no clubbing / cyanosis. No joint deformity upper and lower extremities. Good ROM, no contractures. Normal muscle tone.  Skin: no rashes, lesions, ulcers. No induration Neurologic: CN 2-12 grossly intact. Sensation intact, DTR normal. Strength 5/5 in all 4.  Psychiatric: Normal judgment and insight. Alert and oriented x 3. Normal mood.    Subsequent Medicare wellness visit   1. Risk factors, based on past  M,S,F -cardiovascular disease risk factors include age only   2.  Physical activities: [place] a couple miles a day.   3.  Depression/mood: Stable, not depressed   4.  Hearing: No perceived issues   5.  ADL's: Independent in all ADLs   6.  Fall risk: Low fall risk   7.  Home safety: No problems identified   8.  Height weight, and  visual acuity: height and weight as above, vision:  Vision Screening   Right eye Left eye Both eyes  Without correction     With correction 20/25 20/20 20/20      9.  Counseling: Advise she update her vaccination status   10. Lab orders based on risk factors: Laboratory update will be reviewed   11. Referral : None today   12. Care plan: Follow-up with me in 6 to 12 months   13. Cognitive assessment: No cognitive impairment   14. Screening: Patient provided with a written and personalized 5-10 year screening schedule in the AVS. yes   15. Provider List Update: PCP, GI Dr. Silverio Decamp  16. Advance Directives: Full code   17. Opioids: Patient is not on any opioid prescriptions and has no risk factors for a substance use  disorder.   [facility] Office Visit from 05/27/2021 in [place] [facility] [place]  PHQ-9 Total Score 1       Fall Risk  05/27/2021  Falls in the past year? 0  Number falls in past yr: 0  Injury with Fall? 0     Impression and Plan:  Encounter for osteoporosis screening in asymptomatic postmenopausal patient -DEXA scan requested  Encounter for preventive health examination -Advised routine eye and dental care. -Flu and PCV 20 administered in office today, she will get COVID booster, Tdap and shingles vaccination at [facility] will be updated today. -Healthy lifestyle discussed in detail. -She had a colonoscopy in September 2022 and is a 3-year callback. -Had a negative mammogram in June 2022. -She follows with GYN for her history of cervical cancer and routine Pap smears. -DEXA scan requested today.  Rectal cancer [facility])  -She is now s/p colostomy reversal, had a colonoscopy last month and is a 3-year follow-up.  Malignant neoplasm of cervix, unspecified site [facility]) -She follows with GYN routinely, last Pap was in 2021.  Mild intermittent asthma without complication  - Plan: albuterol (VENTOLIN HFA) 108 (90 Base) MCG/ACT inhaler  Need for influenza vaccination -Flu vaccine administered today.  Need for pneumonia vaccination -PCV 20 administered today.    Patient Instructions  -Nice seeing you today!!  -Lab work today; will notify you once results are available.  -Flu and pneumonia vaccines today.  -Remember COVID booster, tetanus and shingles vaccines at your pharmacy.  -Schedule follow up in 1 year or sooner as needed.      Lelon Frohlich, MD [facility]

## 2021-05-27 NOTE — Addendum Note (Signed)
Addended by: Westley Hummer B on: 05/27/2021 04:39 PM   Modules accepted: Orders

## 2021-05-28 ENCOUNTER — Encounter: Payer: Self-pay | Admitting: Internal Medicine

## 2021-05-28 ENCOUNTER — Other Ambulatory Visit: Payer: Self-pay | Admitting: Internal Medicine

## 2021-05-28 DIAGNOSIS — E559 Vitamin D deficiency, unspecified: Secondary | ICD-10-CM

## 2021-05-28 MED ORDER — VITAMIN D (ERGOCALCIFEROL) 1.25 MG (50000 UNIT) PO CAPS: 50000.0000 [IU] | ORAL_CAPSULE | ORAL | 0 refills | Status: DC

## 2021-05-29 ENCOUNTER — Other Ambulatory Visit: Payer: Self-pay | Admitting: Internal Medicine

## 2021-05-29 DIAGNOSIS — E559 Vitamin D deficiency, unspecified: Secondary | ICD-10-CM

## 2021-06-30 ENCOUNTER — Other Ambulatory Visit: Payer: Self-pay | Admitting: Internal Medicine

## 2021-06-30 DIAGNOSIS — J4520 Mild intermittent asthma, uncomplicated: Secondary | ICD-10-CM

## 2021-07-07 DIAGNOSIS — R222 Localized swelling, mass and lump, trunk: Secondary | ICD-10-CM | POA: Diagnosis not present

## 2021-07-08 ENCOUNTER — Telehealth: Payer: Self-pay | Admitting: Internal Medicine

## 2021-07-08 DIAGNOSIS — J4520 Mild intermittent asthma, uncomplicated: Secondary | ICD-10-CM

## 2021-07-08 MED ORDER — ALBUTEROL SULFATE HFA 108 (90 BASE) MCG/ACT IN AERS: 2.0000 | INHALATION_SPRAY | Freq: Four times a day (QID) | RESPIRATORY_TRACT | 0 refills | Status: DC | PRN

## 2021-07-08 NOTE — Telephone Encounter (Signed)
Patient is requesting for Laura Mcdonald to send in a prescription for her inhaler. She stated that it was an emergency because she left hers when she went out of town and her grandson said he would mail hers in but it would take a couple of days.   Patient could be contacted at [phone].  Please advise.

## 2021-07-08 NOTE — Telephone Encounter (Signed)
Refill sent.

## 2021-07-09 ENCOUNTER — Telehealth: Payer: Self-pay | Admitting: Internal Medicine

## 2021-07-09 NOTE — Telephone Encounter (Signed)
Patient returned call and stated she is waiting on her daughter to come and take her to the ED and she will call the office in the morning

## 2021-07-09 NOTE — Telephone Encounter (Signed)
Left message on machine for patient to return our call to see if patient made it to the ED.

## 2021-07-09 NOTE — Telephone Encounter (Signed)
Pt call back and stated her husband is going to take her to the emergency room and that will last her until tomorrow.

## 2021-07-10 NOTE — Telephone Encounter (Signed)
Spoke with patient and she is able to get her inhaler for $10 and she is doing better.

## 2021-07-16 ENCOUNTER — Telehealth (INDEPENDENT_AMBULATORY_CARE_PROVIDER_SITE_OTHER): Payer: Medicare Other | Admitting: Family Medicine

## 2021-07-16 ENCOUNTER — Encounter: Payer: Self-pay | Admitting: Family Medicine

## 2021-07-16 DIAGNOSIS — R051 Acute cough: Secondary | ICD-10-CM

## 2021-07-16 DIAGNOSIS — J0140 Acute pansinusitis, unspecified: Secondary | ICD-10-CM

## 2021-07-16 DIAGNOSIS — J069 Acute upper respiratory infection, unspecified: Secondary | ICD-10-CM | POA: Diagnosis not present

## 2021-07-16 MED ORDER — BENZONATATE 100 MG PO CAPS: 100.0000 mg | ORAL_CAPSULE | Freq: Two times a day (BID) | ORAL | 0 refills | Status: DC | PRN

## 2021-07-16 MED ORDER — AMOXICILLIN 500 MG PO TABS: 500.0000 mg | ORAL_TABLET | Freq: Two times a day (BID) | ORAL | 0 refills | Status: DC

## 2021-07-16 NOTE — Progress Notes (Signed)
Virtual Visit via Video Note  I connected with Laura Mcdonald on 07/16/21 at  4:30 PM EST by a video enabled telemedicine application 2/2 [id number] pandemic and verified that I am speaking with the correct person using two identifiers.  Location patient: home Location provider:work or home office Persons participating in the virtual visit: patient, provider  I discussed the limitations of evaluation and management by telemedicine and the availability of in person appointments. The patient expressed understanding and agreed to proceed.  Chief Complaint  Patient presents with   Nasal Congestion    Has productive cough and congestion since 12/2, has taken [facility] severe cold and flu relief. Was given albuterol inhaler, but it is not helping. Covid test was negative     HPI: Pt having chest congestion, head congestion, productive coughing, wheezing, SOB, fatigue, decreased appetite. Symptoms started Thursday 07/10/21.  Pt had a fever tmax 101 F in the beginning.  Afebrile x 1.5 days.  Also having constipation, popping in ears, facial pressure, HA x a few days.  Denies sore throat, diarrhea, n/v, recent sick contacts though grandson was sick a little while ago.  Taking CVS chest/head congestion, Delsym DM.  Using albuterol inhaler, but not working.   Pt had 2 negative COVID tests.   ROS: See pertinent positives and negatives per HPI.  Past Medical History:  Diagnosis Date   Allergy    Cancer ([place])    cervical anc rectal   GERD (gastroesophageal reflux disease)    past hx but better wirg weight loss    History of kidney stones    seen on a scan no treatment    Mild intermittent asthma    seasonal or allery induced    Past Surgical History:  Procedure Laterality Date   BIOPSY  01/15/2020   Procedure: BIOPSY;  Surgeon: Rush Landmark Telford Nab., MD;  Location: [facility];  Service: Gastroenterology;;   cervical cancer surgery  1995~   Dr Stann Mainland; "WHIP surgery"   COLONOSCOPY WITH PROPOFOL N/A  01/15/2020   Procedure: COLONOSCOPY WITH PROPOFOL;  Surgeon: Irving Copas., MD;  Location: [place];  Service: Gastroenterology;  Laterality: N/A;   COLOSTOMY REVERSAL N/A 04/26/2020   Procedure: ILEOSTOMY REVERSAL;  Surgeon: Leighton Ruff, MD;  Location: [facility];  Service: General;  Laterality: N/A;   HAND SURGERY Right    SUBMUCOSAL LIFTING INJECTION  01/15/2020   Procedure: SUBMUCOSAL LIFTING INJECTION;  Surgeon: Irving Copas., MD;  Location: [facility] ENDOSCOPY;  Service: Gastroenterology;;   tubal preganancy     XI ROBOTIC ASSISTED LOWER ANTERIOR RESECTION N/A 02/15/2020   Procedure: ROBOTIC LOW ANTERIOR RESECTION, DIVERTING ILEOSTOMY;  Surgeon: Leighton Ruff, MD;  Location[facility];  Service: General;  Laterality: N/A;    Family History  Problem Relation Age of Onset   CVA Mother    Lung cancer Father    Colon polyps Sister    Colon cancer Sister    Diverticulitis Sister    Cancer Brother 62       Prostate cancer    Esophageal cancer Daughter    Stomach cancer Neg Hx    Rectal cancer Neg Hx    Pancreatic cancer Neg Hx    Liver disease Neg Hx      Current Outpatient Medications:    albuterol (VENTOLIN HFA) 108 (90 Base) MCG/ACT inhaler, Inhale 2 puffs into the lungs every 6 (six) hours as needed for wheezing or shortness of breath., Disp: 20.1 g, Rfl: 0   loratadine (CLARITIN) 10 MG tablet,  Take 10 mg by mouth daily., Disp: , Rfl:    Multiple Vitamin (MULTIVITAMIN WITH MINERALS) TABS tablet, Take 1 tablet by mouth daily., Disp: , Rfl:    Vitamin D, Ergocalciferol, (DRISDOL) 1.25 MG (50000 UNIT) CAPS capsule, Take 1 capsule (50,000 Units total) by mouth every 7 (seven) days for 12 doses., Disp: 12 capsule, Rfl: 0   APPLE CIDER VINEGAR PO, Take 1 tablet by mouth daily. gummies a day, Disp: , Rfl:    BLACK ELDERBERRY PO, Take 500 mg by mouth daily. , Disp: , Rfl:    triamcinolone (KENALOG) 0.1 %, Apply 1 application topically 2 (two) times daily. (Patient not  taking: Reported on 05/27/2021), Disp: 30 g, Rfl: 0  Current Facility-Administered Medications:    0.9 %  sodium chloride infusion, 500 mL, Intravenous, Once, Nandigam, Venia Minks, MD  EXAM:  VITALS per patient if applicable: RR between 42-68 bpm  GENERAL: alert, oriented, appears fatigued, non toxic, and in no acute distress  HEENT: atraumatic, conjunctiva clear, no obvious abnormalities on inspection of external nose and ears  NECK: normal movements of the head and neck  LUNGS: SOB and coughing with increased talking.  Cough productive.  On inspection no signs of respiratory distress, breathing rate appears normal, gasping or wheezing  CV: no obvious cyanosis  MS: moves all visible extremities without noticeable abnormality  PSYCH/NEURO: pleasant and cooperative, no obvious depression or anxiety, speech and thought processing grossly intact  ASSESSMENT AND PLAN:  Discussed the following assessment and plan:  Acute cough -At home COVID testing negative x2 -Discussed possible causes including viral etiology, but also concern for pneumonia given productive cough, fever, increased SOB without relief in symptoms with albuterol inhaler. -CXR in a.m. at clinic.  Can also test for flu. -Continue supportive care including warm fluids, rest, OTC medications -Rx for Tessalon sent to pharmacy -Given strict precautions for any increased symptoms  - Plan: benzonatate (TESSALON) 100 MG capsule, DG Chest 2 View, POC Influenza A/B  Acute pansinusitis, recurrence not specified -Saline nasal rinse and Flonase nasal spray  - Plan: amoxicillin (AMOXIL) 500 MG tablet  Upper respiratory tract infection, unspecified type -Supportive care  -Plan: POC Influenza A/B  Follow-up in a.m. for CXR and POC influenza testing.  Given strict return precautions   I discussed the assessment and treatment plan with the patient. The patient was provided an opportunity to ask questions and all were answered.  The patient agreed with the plan and demonstrated an understanding of the instructions.   The patient was advised to call back or seek an in-person evaluation if the symptoms worsen or if the condition fails to improve as anticipated.   Billie Ruddy, MD

## 2021-07-17 ENCOUNTER — Ambulatory Visit (INDEPENDENT_AMBULATORY_CARE_PROVIDER_SITE_OTHER): Payer: Medicare Other

## 2021-07-17 ENCOUNTER — Encounter: Payer: Self-pay | Admitting: Family Medicine

## 2021-07-17 ENCOUNTER — Other Ambulatory Visit: Payer: Medicare Other

## 2021-07-17 ENCOUNTER — Other Ambulatory Visit: Payer: Self-pay | Admitting: Family Medicine

## 2021-07-17 ENCOUNTER — Other Ambulatory Visit: Payer: Self-pay

## 2021-07-17 DIAGNOSIS — J189 Pneumonia, unspecified organism: Secondary | ICD-10-CM

## 2021-07-17 DIAGNOSIS — R051 Acute cough: Secondary | ICD-10-CM | POA: Diagnosis not present

## 2021-07-17 DIAGNOSIS — R059 Cough, unspecified: Secondary | ICD-10-CM | POA: Diagnosis not present

## 2021-07-17 DIAGNOSIS — R509 Fever, unspecified: Secondary | ICD-10-CM | POA: Diagnosis not present

## 2021-07-17 DIAGNOSIS — R0602 Shortness of breath: Secondary | ICD-10-CM | POA: Diagnosis not present

## 2021-07-17 DIAGNOSIS — R5383 Other fatigue: Secondary | ICD-10-CM | POA: Diagnosis not present

## 2021-07-17 LAB — POCT INFLUENZA A/B
Influenza A, POC: NEGATIVE
Influenza B, POC: NEGATIVE

## 2021-07-17 MED ORDER — AMOXICILLIN-POT CLAVULANATE 875-125 MG PO TABS
1.0000 | ORAL_TABLET | Freq: Two times a day (BID) | ORAL | 0 refills | Status: AC
Start: 2021-07-17 — End: 2021-07-24

## 2021-07-17 MED ORDER — AZITHROMYCIN 250 MG PO TABS: ORAL_TABLET | ORAL | 0 refills | Status: AC

## 2021-07-17 NOTE — Addendum Note (Signed)
Addended by: Elza Rafter D on: 07/17/2021 08:29 AM   Modules accepted: Orders

## 2021-07-18 ENCOUNTER — Telehealth: Payer: Self-pay

## 2021-07-18 NOTE — Telephone Encounter (Signed)
---  Caller stated that she was ins to take Azithromycin and Augmentin after her x-ray. She has shortness of breath; that's the reason for the CXR. She had been told to stop taking Amoxicillin (plain). She has a CT chest ordered for Wed. She wants to know if she should take the meds together and will it affect her CT? No new or worsening s/s.  Comments User: Raford Pitcher, RN Date/Time Eilene Ghazi Time): 07/18/2021 10:20:57 AM Checked with the office and the MD wants pt to take those meds together. Ins pt on same. She stated that she usually gets a yeast infection and wanted to know about getting a preventive for it. Ins her to call back if she has any new or worsening s/s since the MD usually waits for s/s before ordering another med.

## 2021-07-18 NOTE — Telephone Encounter (Deleted)
error 

## 2021-07-22 ENCOUNTER — Other Ambulatory Visit: Payer: Medicare Other

## 2021-07-22 ENCOUNTER — Ambulatory Visit
Admission: RE | Admit: 2021-07-22 | Discharge: 2021-07-22 | Disposition: A | Discharge status: Home / Self Care | Payer: Medicare Other | Source: Ambulatory Visit | Attending: General Surgery | Admitting: General Surgery

## 2021-07-22 DIAGNOSIS — C20 Malignant neoplasm of rectum: Secondary | ICD-10-CM

## 2021-07-22 DIAGNOSIS — N200 Calculus of kidney: Secondary | ICD-10-CM | POA: Diagnosis not present

## 2021-07-22 DIAGNOSIS — D171 Benign lipomatous neoplasm of skin and subcutaneous tissue of trunk: Secondary | ICD-10-CM | POA: Diagnosis not present

## 2021-07-22 DIAGNOSIS — J929 Pleural plaque without asbestos: Secondary | ICD-10-CM | POA: Diagnosis not present

## 2021-07-22 MED ORDER — IOPAMIDOL (ISOVUE-300) INJECTION 61%
100.0000 mL | Freq: Once | INTRAVENOUS | Status: AC | PRN
  Administered 2021-07-22: 100 mL via INTRAVENOUS

## 2021-07-22 NOTE — Telephone Encounter (Signed)
Pt states she's doing much better has continued wheezing. Albuterol does not seem to be working as well as it was. Read result note from chest xray to pt; states 1.5 days of abx left. F/u appt with PCP for 12/16. This caller appreciates nasal congestion & pt seems "winded" over the phone. 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 Pt verb understanding.

## 2021-07-24 NOTE — Telephone Encounter (Signed)
error 

## 2021-07-25 ENCOUNTER — Encounter: Payer: Self-pay | Admitting: Internal Medicine

## 2021-07-25 ENCOUNTER — Ambulatory Visit (INDEPENDENT_AMBULATORY_CARE_PROVIDER_SITE_OTHER): Payer: Medicare Other | Admitting: Internal Medicine

## 2021-07-25 VITALS — BP 170/100 | HR 64 | Temp 97.8°F | Wt 194.4 lb

## 2021-07-25 DIAGNOSIS — J189 Pneumonia, unspecified organism: Secondary | ICD-10-CM | POA: Diagnosis not present

## 2021-07-25 LAB — POCT INFLUENZA A/B
Influenza A, POC: NEGATIVE
Influenza B, POC: NEGATIVE

## 2021-07-25 LAB — POC COVID19 BINAXNOW: SARS Coronavirus 2 Ag: NEGATIVE

## 2021-07-25 MED ORDER — AZITHROMYCIN 500 MG PO TABS: 500.0000 mg | ORAL_TABLET | Freq: Every day | ORAL | 0 refills | Status: AC

## 2021-07-25 MED ORDER — CEFTRIAXONE SODIUM 1 G IJ SOLR
1.0000 g | Freq: Once | INTRAMUSCULAR | Status: AC
  Administered 2021-07-25: 1 g via INTRAMUSCULAR

## 2021-07-25 MED ORDER — CEFTRIAXONE SODIUM 1 G IJ SOLR
1.0000 g | Freq: Once | INTRAMUSCULAR | Status: DC
Start: 2021-07-25 — End: 2021-07-25

## 2021-07-25 NOTE — Progress Notes (Signed)
Established Patient Office Visit     This visit occurred during the SARS-CoV-2 public health emergency.  Safety protocols were in place, including screening questions prior to the visit, additional usage of staff PPE, and extensive cleaning of exam room while observing appropriate contact time as indicated for disinfecting solutions.    CC/Reason for Visit: Follow-up pneumonia  HPI: Lil Lepage is a 65 y.o. female who is coming in today for the above mentioned reasons.  She was diagnosed with pneumonia on 12/8 after a chest x-ray.  COVID and flu test were negative.  She then had a CT scan by the instruction of her surgeon to follow-up on her anal cancer where she was found to have upper lobe groundglass opacities consistent with pneumonia.  She was given Augmentin for 7 days and azithromycin for 5 days which she completed.  She returns today with continued productive cough, low-grade fever, and shortness of breath with minimal exertion.  Past Medical/Surgical History: Past Medical History:  Diagnosis Date   Allergy    Cancer ([place])    cervical anc rectal   GERD (gastroesophageal reflux disease)    past hx but better wirg weight loss    History of kidney stones    seen on a scan no treatment    Mild intermittent asthma    seasonal or allery induced    Past Surgical History:  Procedure Laterality Date   BIOPSY  01/15/2020   Procedure: BIOPSY;  Surgeon: Rush Landmark Telford Nab., MD;  Location: [facility] ENDOSCOPY;  Service: Gastroenterology;;   cervical cancer surgery  1995~   Dr Stann Mainland; "WHIP surgery"   COLONOSCOPY WITH PROPOFOL N/A 01/15/2020   Procedure: COLONOSCOPY WITH PROPOFOL;  Surgeon: Irving Copas., MD;  Location: [place];  Service: Gastroenterology;  Laterality: N/A;   COLOSTOMY REVERSAL N/A 04/26/2020   Procedure: ILEOSTOMY REVERSAL;  Surgeon: Leighton Ruff, MD;  Location: [facility];  Service: General;  Laterality: N/A;   HAND SURGERY Right    SUBMUCOSAL  LIFTING INJECTION  01/15/2020   Procedure: SUBMUCOSAL LIFTING INJECTION;  Surgeon: Irving Copas., MD;  Location: [place];  Service: Gastroenterology;;   tubal preganancy     XI ROBOTIC ASSISTED LOWER ANTERIOR RESECTION N/A 02/15/2020   Procedure: ROBOTIC LOW ANTERIOR RESECTION, DIVERTING ILEOSTOMY;  Surgeon: Leighton Ruff, MD;  Location: [facility];  Service: General;  Laterality: N/A;    Social History:  reports that she quit smoking about 37 years ago. Her smoking use included cigarettes. She has a 5.00 pack-year smoking history. She has never used smokeless tobacco. She reports that she does not drink alcohol and does not use drugs.  Allergies: Allergies  Allergen Reactions   Hydrocodone Itching    With anxiety    Family History:  Family History  Problem Relation Age of Onset   CVA Mother    Lung cancer Father    Colon polyps Sister    Colon cancer Sister    Diverticulitis Sister    Cancer Brother 17       Prostate cancer    Esophageal cancer Daughter    Stomach cancer Neg Hx    Rectal cancer Neg Hx    Pancreatic cancer Neg Hx    Liver disease Neg Hx      Current Outpatient Medications:    albuterol (VENTOLIN HFA) 108 (90 Base) MCG/ACT inhaler, Inhale 2 puffs into the lungs every 6 (six) hours as needed for wheezing or shortness of breath., Disp: 20.1 g, Rfl: 0  azithromycin (ZITHROMAX) 500 MG tablet, Take 1 tablet (500 mg total) by mouth daily for 7 days., Disp: 7 tablet, Rfl: 0   benzonatate (TESSALON) 100 MG capsule, Take 1 capsule (100 mg total) by mouth 2 (two) times daily as needed for cough., Disp: 20 capsule, Rfl: 0   APPLE CIDER VINEGAR PO, Take 1 tablet by mouth daily. gummies a day (Patient not taking: Reported on 07/25/2021), Disp: , Rfl:    BLACK ELDERBERRY PO, Take 500 mg by mouth daily.  (Patient not taking: Reported on 07/25/2021), Disp: , Rfl:    loratadine (CLARITIN) 10 MG tablet, Take 10 mg by mouth daily. (Patient not taking: Reported on  07/25/2021), Disp: , Rfl:    Multiple Vitamin (MULTIVITAMIN WITH MINERALS) TABS tablet, Take 1 tablet by mouth daily. (Patient not taking: Reported on 07/25/2021), Disp: , Rfl:   Current Facility-Administered Medications:    0.9 %  sodium chloride infusion, 500 mL, Intravenous, Once, Nandigam, Kavitha V, MD   cefTRIAXone (ROCEPHIN) injection 1 g, 1 g, Intramuscular, Once, Isaac Bliss, Rayford Halsted, MD  Review of Systems:  Constitutional: Positive for fever, chills,  appetite change and fatigue.  HEENT: Denies photophobia, eye pain, redness, hearing loss, ear pain, , mouth sores, trouble swallowing, neck pain, neck stiffness and tinnitus.   Respiratory: Denies chest tightness. Cardiovascular: Denies chest pain, palpitations and leg swelling.  Gastrointestinal: Denies nausea, vomiting, abdominal pain, diarrhea, constipation, blood in stool and abdominal distention.  Genitourinary: Denies dysuria, urgency, frequency, hematuria, flank pain and difficulty urinating.  Endocrine: Denies: hot or cold intolerance, sweats, changes in hair or nails, polyuria, polydipsia. Musculoskeletal: Denies myalgias, back pain, joint swelling, arthralgias and gait problem.  Skin: Denies pallor, rash and wound.  Neurological: Denies dizziness, seizures, syncope, weakness, light-headedness, numbness and headaches.  Hematological: Denies adenopathy. Easy bruising, personal or family bleeding history  Psychiatric/Behavioral: Denies suicidal ideation, mood changes, confusion, nervousness, sleep disturbance and agitation    Physical Exam: Vitals:   07/25/21 1105 07/25/21 1109  BP: (!) 180/110 (!) 170/100  Pulse: 64   Temp: 97.8 F (36.6 C)   TempSrc: Oral   SpO2: 98%   Weight: 194 lb 6.4 oz (88.2 kg)     Body mass index is 31.38 kg/m.   Constitutional: NAD, calm, comfortable Eyes: PERRL, lids and conjunctivae normal, wears corrective lenses ENMT: Mucous membranes are moist.  Respiratory: Significant  inspiratory and expiratory wheezes that are most prominent at bilateral upper bases with some crackles. Cardiovascular: Regular rate and rhythm, no murmurs / rubs / gallops. No extremity edema. 2+ pedal pulses. No carotid bruits.  Abdomen: no tenderness, no masses palpated. No hepatosplenomegaly. Bowel sounds positive.  Psychiatric: Normal judgment and insight. Alert and oriented x 3. Normal mood.    Impression and Plan:  Community acquired pneumonia of right lung, unspecified part of lung  - Plan: cefTRIAXone (ROCEPHIN) injection 1 g, azithromycin (ZITHROMAX) 500 MG tablet -Previous x-ray and CT scan confirm pneumonia. -Flu and COVID tests are again performed in office and are negative. -She has been given 1 g of IM Rocephin and I will give her 500 mg of azithromycin to take for 7 days. -She will follow-up with me in 4 weeks for repeat imaging. -We discussed symptoms that would require emergent evaluation over the weekend.  Time spent: 32 minutes reviewing chart, interviewing and examining patient and formulating plan of care.   Patient Instructions  -Nice seeing you today!!  You have pneumonia. Flu and COVID tests are again negative.  We  have given you an IM antibiotic called ceftriaxone. A prescription has been sent in for azithromycin 500 mg to take 1 tablet daily for 5 days.  Please schedule follow up in 4 weeks. If symptoms worsen over the weekend, consider going to the ED.    Laura Frohlich, MD [facility]

## 2021-07-25 NOTE — Patient Instructions (Signed)
-  Nice seeing you today!!  You have pneumonia. Flu and COVID tests are again negative.  We have given you an IM antibiotic called ceftriaxone. A prescription has been sent in for azithromycin 500 mg to take 1 tablet daily for 5 days.  Please schedule follow up in 4 weeks. If symptoms worsen over the weekend, consider going to the ED.

## 2021-08-01 ENCOUNTER — Telehealth: Payer: Self-pay | Admitting: Internal Medicine

## 2021-08-01 MED ORDER — FLUCONAZOLE 150 MG PO TABS: 150.0000 mg | ORAL_TABLET | Freq: Once | ORAL | 0 refills | Status: AC

## 2021-08-01 NOTE — Telephone Encounter (Signed)
Rx sent 

## 2021-08-01 NOTE — Telephone Encounter (Signed)
Pt was seen on 07-25-2021 and was prescribed abx and pt has yeast infection burning, itching and would like diflucan sent to pharm. Pt has tried OTC medication. Pt has also tried hydrogen peroxide  no relief  [facility] [phone] - [place], [address] [place] Phone:  [phone]  Fax:  [phone]

## 2021-08-25 ENCOUNTER — Ambulatory Visit (INDEPENDENT_AMBULATORY_CARE_PROVIDER_SITE_OTHER): Payer: Medicare Other

## 2021-08-25 ENCOUNTER — Other Ambulatory Visit: Payer: Self-pay

## 2021-08-25 ENCOUNTER — Other Ambulatory Visit: Payer: Self-pay | Admitting: Internal Medicine

## 2021-08-25 ENCOUNTER — Ambulatory Visit (INDEPENDENT_AMBULATORY_CARE_PROVIDER_SITE_OTHER): Payer: Medicare Other | Admitting: Internal Medicine

## 2021-08-25 ENCOUNTER — Encounter: Payer: Self-pay | Admitting: Internal Medicine

## 2021-08-25 VITALS — BP 120/80 | HR 73 | Temp 98.0°F | Ht 66.0 in | Wt 196.0 lb

## 2021-08-25 DIAGNOSIS — E559 Vitamin D deficiency, unspecified: Secondary | ICD-10-CM

## 2021-08-25 DIAGNOSIS — J189 Pneumonia, unspecified organism: Secondary | ICD-10-CM

## 2021-08-25 LAB — VITAMIN D 25 HYDROXY (VIT D DEFICIENCY, FRACTURES): VITD: 31.65 ng/mL (ref 30.00–100.00)

## 2021-08-25 MED ORDER — VITAMIN D (ERGOCALCIFEROL) 1.25 MG (50000 UNIT) PO CAPS: 50000.0000 [IU] | ORAL_CAPSULE | ORAL | 0 refills | Status: AC

## 2021-08-25 NOTE — Progress Notes (Signed)
Established Patient Office Visit     This visit occurred during the SARS-CoV-2 public health emergency.  Safety protocols were in place, including screening questions prior to the visit, additional usage of staff PPE, and extensive cleaning of exam room while observing appropriate contact time as indicated for disinfecting solutions.    CC/Reason for Visit: Follow-up pneumonia  HPI: Laura Mcdonald is a 66 y.o. female who is coming in today for the above mentioned reasons.  She was diagnosed with pneumonia in December and had 2 separate rounds of antibiotics due to persistent symptoms.  At last visit she was given IM Rocephin as well as azithromycin and has done well.  She says she is "100% better".  She still feels like she has wheezing at times.  She has been diagnosed with asthma.  She does not have dyspnea on exertion, she does have some congestion and postnasal drip.  Past Medical/Surgical History: Past Medical History:  Diagnosis Date   Allergy    Cancer ([place])    cervical anc rectal   GERD (gastroesophageal reflux disease)    past hx but better wirg weight loss    History of kidney stones    seen on a scan no treatment    Mild intermittent asthma    seasonal or allery induced    Past Surgical History:  Procedure Laterality Date   BIOPSY  01/15/2020   Procedure: BIOPSY;  Surgeon: Rush Landmark Telford Nab., MD;  Location: [facility] ENDOSCOPY;  Service: Gastroenterology;;   cervical cancer surgery  1995~   Dr Stann Mainland; "WHIP surgery"   COLONOSCOPY WITH PROPOFOL N/A 01/15/2020   Procedure: COLONOSCOPY WITH PROPOFOL;  Surgeon: Irving Copas., MD;  Location: [place];  Service: Gastroenterology;  Laterality: N/A;   COLOSTOMY REVERSAL N/A 04/26/2020   Procedure: ILEOSTOMY REVERSAL;  Surgeon: Leighton Ruff, MD;  Location: [facility];  Service: General;  Laterality: N/A;   HAND SURGERY Right    SUBMUCOSAL LIFTING INJECTION  01/15/2020   Procedure: SUBMUCOSAL LIFTING INJECTION;   Surgeon: Irving Copas., MD;  Location: [place];  Service: Gastroenterology;;   tubal preganancy     XI ROBOTIC ASSISTED LOWER ANTERIOR RESECTION N/A 02/15/2020   Procedure: ROBOTIC LOW ANTERIOR RESECTION, DIVERTING ILEOSTOMY;  Surgeon: Leighton Ruff, MD;  Location: [facility];  Service: General;  Laterality: N/A;    Social History:  reports that she quit smoking about 38 years ago. Her smoking use included cigarettes. She has a 5.00 pack-year smoking history. She has never used smokeless tobacco. She reports that she does not drink alcohol and does not use drugs.  Allergies: Allergies  Allergen Reactions   Hydrocodone Itching    With anxiety    Family History:  Family History  Problem Relation Age of Onset   CVA Mother    Lung cancer Father    Colon polyps Sister    Colon cancer Sister    Diverticulitis Sister    Cancer Brother 56       Prostate cancer    Esophageal cancer Daughter    Stomach cancer Neg Hx    Rectal cancer Neg Hx    Pancreatic cancer Neg Hx    Liver disease Neg Hx      Current Outpatient Medications:    albuterol (VENTOLIN HFA) 108 (90 Base) MCG/ACT inhaler, Inhale 2 puffs into the lungs every 6 (six) hours as needed for wheezing or shortness of breath., Disp: 20.1 g, Rfl: 0   APPLE CIDER VINEGAR PO, Take 1  tablet by mouth daily. gummies a day, Disp: , Rfl:    BLACK ELDERBERRY PO, Take 500 mg by mouth daily., Disp: , Rfl:    loratadine (CLARITIN) 10 MG tablet, Take 10 mg by mouth daily., Disp: , Rfl:    Multiple Vitamin (MULTIVITAMIN WITH MINERALS) TABS tablet, Take 1 tablet by mouth daily., Disp: , Rfl:   Current Facility-Administered Medications:    0.9 %  sodium chloride infusion, 500 mL, Intravenous, Once, Nandigam, Kavitha V, MD  Review of Systems:  Constitutional: Denies fever, chills, diaphoresis, appetite change and fatigue.  HEENT: Denies photophobia, eye pain, redness, hearing loss, ear pain, congestion, sore throat, rhinorrhea,  sneezing, mouth sores, trouble swallowing, neck pain, neck stiffness and tinnitus.   Respiratory: Denies SOB, DOE, chest tightness. Cardiovascular: Denies chest pain, palpitations and leg swelling.  Gastrointestinal: Denies nausea, vomiting, abdominal pain, diarrhea, constipation, blood in stool and abdominal distention.  Genitourinary: Denies dysuria, urgency, frequency, hematuria, flank pain and difficulty urinating.  Endocrine: Denies: hot or cold intolerance, sweats, changes in hair or nails, polyuria, polydipsia. Musculoskeletal: Denies myalgias, back pain, joint swelling, arthralgias and gait problem.  Skin: Denies pallor, rash and wound.  Neurological: Denies dizziness, seizures, syncope, weakness, light-headedness, numbness and headaches.  Hematological: Denies adenopathy. Easy bruising, personal or family bleeding history  Psychiatric/Behavioral: Denies suicidal ideation, mood changes, confusion, nervousness, sleep disturbance and agitation    Physical Exam: Vitals:   08/25/21 0812  BP: 120/80  Pulse: 73  Temp: 98 F (36.7 C)  TempSrc: Oral  SpO2: 100%  Weight: 196 lb (88.9 kg)  Height: 5\' 6"  (1.676 m)    Body mass index is 31.64 kg/m.   Constitutional: NAD, calm, comfortable Eyes: PERRL, lids and conjunctivae normal, wears corrective lenses ENMT: Mucous membranes are moist.  Respiratory: Bilateral rhonchi, no wheezes, no crackles.  Normal respiratory effort. No accessory muscle use.  Cardiovascular: Regular rate and rhythm, no murmurs / rubs / gallops. No extremity edema.  Neurologic: Grossly intact and nonfocal Psychiatric: Normal judgment and insight. Alert and oriented x 3. Normal mood.    Impression and Plan:  Community acquired pneumonia, unspecified laterality - Plan: DG Chest 2 View -Repeat chest x-ray to document resolution of pneumonia, given her symptoms have resolved, suspect improvement.  Time spent: 21 minutes reviewing chart, interviewing and  examining patient and formulating plan of care.     Lelon Frohlich, MD [facility]

## 2021-08-26 ENCOUNTER — Ambulatory Visit: Payer: Medicare Other | Attending: General Surgery

## 2021-08-26 ENCOUNTER — Other Ambulatory Visit: Payer: Self-pay

## 2021-08-26 DIAGNOSIS — R222 Localized swelling, mass and lump, trunk: Secondary | ICD-10-CM | POA: Insufficient documentation

## 2021-08-26 DIAGNOSIS — R293 Abnormal posture: Secondary | ICD-10-CM

## 2021-08-26 DIAGNOSIS — R252 Cramp and spasm: Secondary | ICD-10-CM

## 2021-08-26 DIAGNOSIS — R279 Unspecified lack of coordination: Secondary | ICD-10-CM | POA: Insufficient documentation

## 2021-08-26 DIAGNOSIS — L905 Scar conditions and fibrosis of skin: Secondary | ICD-10-CM | POA: Diagnosis not present

## 2021-08-26 DIAGNOSIS — M6281 Muscle weakness (generalized): Secondary | ICD-10-CM | POA: Diagnosis not present

## 2021-08-26 NOTE — Patient Instructions (Signed)
Access Code: [email or web address] URL: [email or web address] Date: 08/26/2021 Prepared by: Heather Roberts  Exercises Supine Lower Trunk Rotation - 1 x daily - 7 x weekly - 2 sets - 10 reps Sidelying Open Book Thoracic Lumbar Rotation and Extension - 1 x daily - 7 x weekly - 1 sets - 10 reps Supine Diaphragmatic Breathing - 1 x daily - 7 x weekly - 2 sets - 10 reps  [facility] [address] [place], [place] [address] Phone # [phone] Fax [phone]

## 2021-08-26 NOTE — Therapy (Addendum)
[facility] [place] @ [address] [place], [place], [address] Phone: [phone]   Fax:  [phone]  Physical Therapy Evaluation  Patient Details  Name: Laura Mcdonald MRN: [id number] Date of Birth: 13-Jun-1956 Referring Provider (PT): Leighton Ruff, MD   Encounter Date: 08/26/2021   PT End of Session - 08/26/21 0901     Visit Number 1    Date for PT Re-Evaluation 11/04/21    Authorization Type [employer] Medicare    Progress Note Due on Visit 10    PT Start Time 0758    PT Stop Time 0844    PT Time Calculation (min) 46 min    Activity Tolerance Patient tolerated treatment well    Behavior During Therapy [facility] for tasks assessed/performed             Past Medical History:  Diagnosis Date   Allergy    Cancer ([place])    cervical anc rectal   GERD (gastroesophageal reflux disease)    past hx but better wirg weight loss    History of kidney stones    seen on a scan no treatment    Mild intermittent asthma    seasonal or allery induced    Past Surgical History:  Procedure Laterality Date   BIOPSY  01/15/2020   Procedure: BIOPSY;  Surgeon: Irving Copas., MD;  Location: [facility] ENDOSCOPY;  Service: Gastroenterology;;   cervical cancer surgery  1995~   Dr Stann Mainland; "WHIP surgery"   COLONOSCOPY WITH PROPOFOL N/A 01/15/2020   Procedure: COLONOSCOPY WITH PROPOFOL;  Surgeon: Irving Copas., MD;  Location: [place] [facility];  Service: Gastroenterology;  Laterality: N/A;   COLOSTOMY REVERSAL N/A 04/26/2020   Procedure: ILEOSTOMY REVERSAL;  Surgeon: Leighton Ruff, MD;  Location: [facility];  Service: General;  Laterality: N/A;   HAND SURGERY Right    SUBMUCOSAL LIFTING INJECTION  01/15/2020   Procedure: SUBMUCOSAL LIFTING INJECTION;  Surgeon: Irving Copas., MD;  Location: [place];  Service: Gastroenterology;;   tubal preganancy     XI ROBOTIC ASSISTED LOWER ANTERIOR RESECTION N/A 02/15/2020   Procedure: ROBOTIC LOW  ANTERIOR RESECTION, DIVERTING ILEOSTOMY;  Surgeon: Leighton Ruff, MD;  Location: [facility];  Service: General;  Laterality: N/A;    There were no vitals filed for this visit.    Subjective Assessment - 08/26/21 0755     Subjective Pt is a 66 year old female wit hchief c/o abdominal pain on Rt side around midline. She states that area will get hot, swell, and then the skin will peel. She is irritated by even clothing or her hand rubs across it; compression shirt does help with this and describes as discomfort. She states that they have checked for hernias and there are none present. She reports that when she had colospocy bag removal, it was about 3 months later that she noticed swelling and then a month after that she started having discomfort. She typically drinks 6-7 bottles of water a day. Walking and performing stairs painful - will have to apply pressure to reduce pain. She will have LBP with prolonged standing.    Pertinent History LAR/colostomy reversal, ileostomy 2021, constipation, rectal cancer, cervical cancer, tubal pregnancy with surgical resolution; abdominal CT scan and colonoscopy performed to make sure there was no medical concern    Limitations Walking;Sitting;Other (comment);Standing   stairs   How long can you sit comfortably? pain if she is not reclining    How long can you stand comfortably? can stand  for 10 minutes    How long can you walk comfortably? Unable to walk a mile right now.    Patient Stated Goals To reduce pain and be able to walk without pain.    Currently in Pain? Yes    Pain Score 2    With bending pain will become sharp   Pain Location Abdomen   she will have related LBP with abdominal pain   Pain Orientation Right    Pain Descriptors / Indicators Discomfort;Sharp    Pain Type Chronic pain    Pain Onset Other (comment)    Pain Frequency Intermittent    Aggravating Factors  walking, sitting upright, stairs, standing    Pain Relieving Factors pressure,  compression garment    Effect of Pain on Daily Activities limiting    Multiple Pain Sites No                [facility] PT Assessment - 08/26/21 0001       Assessment   Medical Diagnosis R22.2 (ICD-10-CM) - Localized swelling, mass and lump, trunk  L90.5 (ICD-10-CM) - Scar conditions and fibrosis of skin    Referring Provider (PT) Leighton Ruff, MD    Onset Date/Surgical Date 02/14/20    Next MD Visit 6-8 months    Prior Therapy yes   for hand 25 years ago     Precautions   Precautions None      Restrictions   Weight Bearing Restrictions No      Balance Screen   Has the patient fallen in the past 6 months No    Has the patient had a decrease in activity level because of a fear of falling?  No    Is the patient reluctant to leave their home because of a fear of falling?  No      Home Ec[employer] residence      Prior Function   Level of Independence Independent      Cognition   Overall Cognitive Status Within Functional Limits for tasks assessed      Observation/Other Assessments   Skin Integrity Rt side of midline in abdomen superior to umbilicus red irritated nodule that is reason why pt is in PT - very tender to palpation and restriction noted in all directions; inferior and slightly lateral to this, scar tissue at site of colostomy bag with moderate tenderness and restriction in all directions; site of tubal pregnancy surgery restricted and mildly tender      Functional Tests   Functional tests Squat;Single leg stance      Squat   Comments squat rt weight shift      Single Leg Stance   Comments single leg stance >10 sec bil      Posture/Postural Control   Posture Comments Cervical lordosis, elevated R iliac crest, Rt pelvic rotation, forward head posture, rounded shoulders      ROM / Strength   AROM / PROM / Strength AROM;Strength      AROM   Overall AROM Comments flexion reduced by 25%, Lt side bend reduced by 25%, Lt side bend  reduced by 50%      Strength   Overall Strength Comments Bil hip flexion 4/5, Rt hip abduction/adduction 4/5, Bil hip extension 4/5      Flexibility   Soft Tissue Assessment /Muscle Length yes   Reduced Bil piriformis flexibility; mild reduction and hip flexor flexibility with LBP testing Lt in prone     Palpation  Palpation comment tenderness and stinging throughout palpation of abdomen; scar tissue restrion profound and expanding far from incision sites/site of colostomy bag      Bed Mobility   Bed Mobility Rolling Right;Rolling Left;Left Sidelying to Sit;Supine to Sit   Breath holding and abnormal abdominal bracing with all activities that exacerbate abdominal pain                       Objective measurements completed on examination: See above findings.     Pelvic Floor Special Questions - 08/26/21 0001     Prior Pelvic/Prostate Exam Yes    Are you Pregnant or attempting pregnancy? No    Prior Pregnancies Yes    Number of Pregnancies 5    Number of C-Sections 0    Number of Vaginal Deliveries 5    Any difficulty with labor and deliveries No    Episiotomy Performed No   tearing with first delivery   Currently Sexually Active Yes    Is this Painful No    History of sexually transmitted disease Yes   chlamydia   Urinary Leakage Yes   SUI with sneezing   How often daily    Pad use 1/day    Activities that cause leaking Sneezing    Urinary urgency No    Urinary frequency no    Fecal incontinence No   does have urgency with BM, BM3x/daily, and straining   Fluid intake typically >64oz    Caffeine beverages yes, 1 cup daily    Falling out feeling (prolapse) No              [facility] Adult PT Treatment/Exercise - 08/26/21 0001       Exercises   Exercises Lumbar      Lumbar Exercises: Stretches   Lower Trunk Rotation Limitations 2 x 10 with progressing mobility - for scar tissue mobilization and mobility of lumbar spine    Other Lumbar Stretch Exercise  Open books: 5x bil for abominal mobility/scar tissue mobility      Manual Therapy   Manual Therapy --   Scar tissue mobilization to abdomen at site of nodule and colostomy bag; pt taught how to perform herself                    PT Education - 08/26/21 0900     Education Details Pt education performed on how scar tissue may be related to abdominal pain and what treatment will include to address impairments. Access Code: [email or web address]    Person(s) Educated Patient    Methods Explanation;Demonstration;Tactile cues;Verbal cues;Handout    Comprehension Verbalized understanding              PT Short Term Goals - 08/26/21 0914       PT SHORT TERM GOAL #1   Title Pt will be independent with HEP in 2 weeks.    Time 2    Period Weeks    Status New    Target Date 09/09/21      PT SHORT TERM GOAL #2   Title Pt will be able to perform bed mobility without any increase in abdominal pain and appropriate pressure management.    Time 4    Period Weeks    Status New    Target Date 09/23/21      PT SHORT TERM GOAL #3   Title Pt will be able to sit/stand for >20 minutes without increase abdominal pain.    Time 4  Period Weeks    Target Date 09/23/21      PT SHORT TERM GOAL #4   Title Pt will improve all impaired lumbar A/ROM by 25% in order to improve functional mobility.    Time 4    Period Weeks    Status New    Target Date 09/23/21               PT Long Term Goals - 08/26/21 0917       PT LONG TERM GOAL #1   Title Pt will be independent with advanced HEP in 8 weeks.    Time 8    Period Weeks    Status New    Target Date 10/21/21      PT LONG TERM GOAL #2   Title Pt will report no episodes of SUI with sneezing in order to reduce pad usage and improve personal hygiene.    Time 8    Period Weeks    Status New    Target Date 10/21/21      PT LONG TERM GOAL #3   Title Pt will be able to ambulate >20 minutes without increased abdominal pain in order to  perform community ambulation.    Time 10    Period Weeks    Status New    Target Date 11/04/21      PT LONG TERM GOAL #4   Title Pt will improve all impaired hip strength by one muscle grade in order to demonstrate improved functional strength and be able to ambulate and perform stairs with less difficulty.    Time 10    Period Weeks    Status New    Target Date 11/04/21                    Plan - 08/26/21 0902     Clinical Impression Statement Pt is a 66 year old female with chief complaint of rt mid-abdominal pain for the last year and a half that is limiting sitting, standing, ambulation, and stairs; after discussing, she has secondary complaints of SUI and fecal urgency. Exam findings notable for reduced lumbar A/ROM, increased abdominal pain with passive hip flexion on Rt, decreased hip strength, tightness and tenderness to Bil lumbar paraspinals, tenderness and scar tissue restriction throughout abdomen (R>L) with reproduction of pain, poor pressure management with bed mobility, Bil hip weakness, abnormal posture, and decreased posterior/anterior hip flexibility. Signs and symptoms most consistent with abdominal scar tissue restriction that is impacting strength and mobility and abdominal muscles, contributing to further symptoms of LBP, SUI, and fecal urgency; this has been exacerbated by decrease in activity due to pain. She tolerated initial treatment of scar tisuse mobilization, gentle stretches, and diaphragmatic breathing well demonstrated by minimal increases in pain. She will benefit from skilled PT intervention in order to address impairments, decrease abdominal pain and restriction, and return to functional activities with improved comfort.    Personal Factors and Comorbidities Comorbidity 1;Comorbidity 2;Comorbidity 3+    Comorbidities LAR/colostomy reversal, ileostomy 2021, rectal cancer, cervical cancer, tubal pregnancy with surgical resolution    Examination-Activity  Limitations Bed Mobility;Continence;Stand;Stairs;Sit;Locomotion Level    Examination-Participation Restrictions Community Activity    Stability/Clinical Decision Making Evolving/Moderate complexity    Clinical Decision Making Moderate    Rehab Potential Good    PT Frequency 1x / week    PT Duration 12 weeks    PT Treatment/Interventions ADLs/Self Care Home Management;Biofeedback;Cryotherapy;Electrical Stimulation;Moist Heat;Functional mobility training;Therapeutic activities;Therapeutic exercise;Neuromuscular re-education;Manual techniques;Patient/family education;Scar mobilization;Dry  needling;Spinal Manipulations    PT Next Visit Plan Continue scar tissue mobilization; begin gentle transversus abdominus training in order to help mobilization abdomen and begin strengthening; progress mobility activity; continue mobility activies including cat/cow, child's pose, and hip flexor stretches.    PT Home Exercise Plan Access Code: [email or web address]    Consulted and Agree with Plan of Care Patient             Patient will benefit from skilled therapeutic intervention in order to improve the following deficits and impairments:  Decreased coordination, Decreased range of motion, Difficulty walking, Increased fascial restricitons, Impaired tone, Decreased endurance, Increased muscle spasms, Decreased activity tolerance, Decreased scar mobility, Hypomobility, Decreased mobility, Decreased strength, Postural dysfunction  Visit Diagnosis: Cramp and spasm  Muscle weakness (generalized)  Abnormal posture  Unspecified lack of coordination     Problem List Patient Active Problem List   Diagnosis Date Noted   Vitamin D deficiency 05/28/2021   Rectal cancer ([place]) 01/22/2020   Rectal polyp 12/31/2019   Abnormal colonoscopy 12/31/2019   Mild intermittent asthma    Cervical cancer ([place])    KNEE PAIN, RIGHT 03/13/2008   ALLERGIC RHINITIS 04/29/2007   GERD 04/29/2007    Heather Roberts, PT,  DPT01/17/239:21 AM   [facility] @ [address], [place], [address] Phone: [phone]   Fax:  [phone]  Name: Journee Bobrowski MRN: [id number] Date of Birth: 03-May-1956

## 2021-08-27 ENCOUNTER — Telehealth: Payer: Self-pay | Admitting: Internal Medicine

## 2021-08-27 NOTE — Telephone Encounter (Signed)
Patient called to see if she still needs to come back on January 23rd to do lab work for her vitamin D levels if she already did the lab on Monday January 16th.       Good callback number is [phone]      Please advise

## 2021-08-27 NOTE — Telephone Encounter (Signed)
Appt canceled patient is aware

## 2021-08-27 NOTE — Telephone Encounter (Signed)
I do not see where she will need to keep appt for labs since completed on 1/16. Please confirm and I will cancel if needed.

## 2021-09-01 ENCOUNTER — Other Ambulatory Visit: Payer: Self-pay

## 2021-09-01 ENCOUNTER — Ambulatory Visit: Payer: Medicare Other

## 2021-09-01 ENCOUNTER — Other Ambulatory Visit: Payer: Medicare Other

## 2021-09-01 DIAGNOSIS — M6281 Muscle weakness (generalized): Secondary | ICD-10-CM | POA: Diagnosis not present

## 2021-09-01 DIAGNOSIS — R293 Abnormal posture: Secondary | ICD-10-CM

## 2021-09-01 DIAGNOSIS — R252 Cramp and spasm: Secondary | ICD-10-CM

## 2021-09-01 DIAGNOSIS — L905 Scar conditions and fibrosis of skin: Secondary | ICD-10-CM | POA: Diagnosis not present

## 2021-09-01 DIAGNOSIS — R279 Unspecified lack of coordination: Secondary | ICD-10-CM

## 2021-09-01 DIAGNOSIS — R222 Localized swelling, mass and lump, trunk: Secondary | ICD-10-CM | POA: Diagnosis not present

## 2021-09-01 NOTE — Therapy (Signed)
[facility] @ [address] [place], [place], [address] Phone: [phone]   Fax:  [phone]  Physical Therapy Treatment  Patient Details  Name: Laura Mcdonald MRN: [id number] Date of Birth: 1956-02-06 Referring Provider (PT): Leighton Ruff, MD   Encounter Date: 09/01/2021   PT End of Session - 09/01/21 0910     Visit Number 2    Date for PT Re-Evaluation 11/04/21    Authorization Type UHC Medicare    Progress Note Due on Visit 10    PT Start Time 0846    PT Stop Time 0927    PT Time Calculation (min) 41 min    Activity Tolerance Patient tolerated treatment well    Behavior During Therapy [facility] for tasks assessed/performed             Past Medical History:  Diagnosis Date   Allergy    Cancer ([place])    cervical anc rectal   GERD (gastroesophageal reflux disease)    past hx but better wirg weight loss    History of kidney stones    seen on a scan no treatment    Mild intermittent asthma    seasonal or allery induced    Past Surgical History:  Procedure Laterality Date   BIOPSY  01/15/2020   Procedure: BIOPSY;  Surgeon: Irving Copas., MD;  Location: [facility] ENDOSCOPY;  Service: Gastroenterology;;   cervical cancer surgery  1995~   Dr Stann Mainland; "WHIP surgery"   COLONOSCOPY WITH PROPOFOL N/A 01/15/2020   Procedure: COLONOSCOPY WITH PROPOFOL;  Surgeon: Irving Copas., MD;  Location: [facility];  Service: Gastroenterology;  Laterality: N/A;   COLOSTOMY REVERSAL N/A 04/26/2020   Procedure: ILEOSTOMY REVERSAL;  Surgeon: Leighton Ruff, MD;  Location: [facility];  Service: General;  Laterality: N/A;   HAND SURGERY Right    SUBMUCOSAL LIFTING INJECTION  01/15/2020   Procedure: SUBMUCOSAL LIFTING INJECTION;  Surgeon: Irving Copas., MD;  Location: [facility];  Service: Gastroenterology;;   tubal preganancy     XI ROBOTIC ASSISTED LOWER ANTERIOR RESECTION N/A 02/15/2020   Procedure: ROBOTIC LOW  ANTERIOR RESECTION, DIVERTING ILEOSTOMY;  Surgeon: Leighton Ruff, MD;  Location: [facility];  Service: General;  Laterality: N/A;    There were no vitals filed for this visit.   Subjective Assessment - 09/01/21 0847     Subjective Pt states that the scar tissue where colostomy bag is feeling much better and less restricted. Shedecided to perform some of the scar tissue mobilization in the shower with hot water and reports that pus started coming out; she then put a band aide over it and noticed that there was also some blood coming out. She reports that one very irritated spot is getting smaller and feeling less tender after this. She has conitnue dto work on [employer] and all exercises. She is still having mild irritation when clothes touch.    Pertinent History LAR/colostomy reversal, ileostomy 2021, constipation, rectal cancer, cervical cancer, tubal pregnancy with surgical resolution; abdominal CT scan and colonoscopy performed to make sure there was no medical concern    Currently in Pain? No/denies    Multiple Pain Sites No                               [facility] Adult PT Treatment/Exercise - 09/01/21 0001       Neuro Re-ed    Neuro Re-ed Details  transversus abdominus training  with VC/TCs for correct facilitation with breathing; performed with supine march to provide additional challenge, 2 x 10, with continued VC/TCs.      Lumbar Exercises: Stretches   Active Hamstring Stretch Right;Left;1 rep;60 seconds    Piriformis Stretch Right;Left;1 rep;60 seconds      Lumbar Exercises: Quadruped   [place] 20 reps   breath coordination   Other Quadruped Lumbar Exercises Child's pose   relaxation training for abdominal moiblit; VC/TCs for diaphragmatic breathing     Manual Therapy   Manual Therapy Myofascial release;Soft tissue mobilization    Soft tissue mobilization Abdomen for increased blood flor and mobility    Myofascial Release Scar tissue mobilization and  mobilization with movement (bent knee fall out with scar tissue mobilization)                     PT Education - 09/01/21 0909     Education Details Pt education performed on safety of continuing abdominal massage and to gently help work what ever infection is in abdomen out; we discussed communicating what she has experienced with MD and certainly call if pain worsens or she notices any changes. HEP updated.    Person(s) Educated Patient    Methods Explanation;Tactile cues;Handout    Comprehension Verbalized understanding              PT Short Term Goals - 08/26/21 0914       PT SHORT TERM GOAL #1   Title Pt will be independent with HEP in 2 weeks.    Time 2    Period Weeks    Status New    Target Date 09/09/21      PT SHORT TERM GOAL #2   Title Pt will be able to perform bed mobility without any increase in abdominal pain and appropriate pressure management.    Time 4    Period Weeks    Status New    Target Date 09/23/21      PT SHORT TERM GOAL #3   Title Pt will be able to sit/stand for >20 minutes without increase abdominal pain.    Time 4    Period Weeks    Target Date 09/23/21      PT SHORT TERM GOAL #4   Title Pt will improve all impaired lumbar A/ROM by 25% in order to improve functional mobility.    Time 4    Period Weeks    Status New    Target Date 09/23/21               PT Long Term Goals - 08/26/21 0917       PT LONG TERM GOAL #1   Title Pt will be independent with advanced HEP in 8 weeks.    Time 12    Period Weeks    Status New    Target Date 11/19/21      PT LONG TERM GOAL #2   Title Pt will report no episodes of SUI with sneezing in order to reduce pad usage and improve personal hygiene.    Time 12    Period Weeks    Status New    Target Date 11/19/21      PT LONG TERM GOAL #3   Title Pt will be able to ambulate >20 minutes without increased abdominal pain in order to perform community ambulation.    Time 12     Period Weeks    Status New    Target Date  11/19/21      PT LONG TERM GOAL #4   Title Pt will improve all impaired hip strength by one muscle grade in order to demonstrate improved functional strength and be able to ambulate and perform stairs with less difficulty.    Time 12    Period Weeks    Status New    Target Date 11/19/21                   Plan - 09/01/21 0915     Clinical Impression Statement Pt overall doing well demonstrated by decreased scar tissue restriction in abdomen and irritated nodule having expressed, now being smaller in size - pt encouraged to communicate this change in tissue with medical provider. Good tolerance to manual techniques to further reduce scar tissue restriction, including mobilization with movement in the form of bent knee fall out with scar tissue release. She did well with transversus abdominnus training with good contraction and ability to utilize with cat/cow and supine march; no pain reported with any other stretches or exercises. She will continue to benefit from skilled PT intervetnion on order to decrease abdominal pain and restriction.    Personal Factors and Comorbidities Comorbidity 1;Comorbidity 2;Comorbidity 3+    Comorbidities LAR/colostomy reversal, ileostomy 2021, rectal cancer, cervical cancer, tubal pregnancy with surgical resolution    Examination-Activity Limitations Bed Mobility;Continence;Stand;Stairs;Sit;Locomotion Level    Examination-Participation Restrictions Community Activity    Rehab Potential Good    PT Frequency 1x / week    PT Duration 12 weeks    PT Treatment/Interventions ADLs/Self Care Home Management;Biofeedback;Cryotherapy;Electrical Stimulation;Moist Heat;Functional mobility training;Therapeutic activities;Therapeutic exercise;Neuromuscular re-education;Manual techniques;Patient/family education;Scar mobilization;Dry needling;Spinal Manipulations    PT Next Visit Plan Continue to progress core strengthening and  moility activities; manual techniques to continue improving scar tissue restrictoin.    PT Home Exercise Plan Access Code: [email or web address]    Consulted and Agree with Plan of Care Patient             Patient will benefit from skilled therapeutic intervention in order to improve the following deficits and impairments:  Decreased coordination, Decreased range of motion, Difficulty walking, Increased fascial restricitons, Impaired tone, Decreased endurance, Increased muscle spasms, Decreased activity tolerance, Decreased scar mobility, Hypomobility, Decreased mobility, Decreased strength, Postural dysfunction  Visit Diagnosis: Cramp and spasm  Muscle weakness (generalized)  Abnormal posture  Unspecified lack of coordination     Problem List Patient Active Problem List   Diagnosis Date Noted   Vitamin D deficiency 05/28/2021   Rectal cancer ([place]) 01/22/2020   Rectal polyp 12/31/2019   Abnormal colonoscopy 12/31/2019   Mild intermittent asthma    Cervical cancer ([place])    KNEE PAIN, RIGHT 03/13/2008   ALLERGIC RHINITIS 04/29/2007   GERD 04/29/2007    Heather Roberts, PT, DPT01/23/239:35 AM   [place] @ [address] [place], [place], [address] Phone: [phone]   Fax:  [phone]  Name: Laura Mcdonald MRN: [id number] Date of Birth: [birth year]

## 2021-09-02 ENCOUNTER — Other Ambulatory Visit: Payer: Self-pay

## 2021-09-02 DIAGNOSIS — E559 Vitamin D deficiency, unspecified: Secondary | ICD-10-CM

## 2021-09-08 ENCOUNTER — Other Ambulatory Visit: Payer: Self-pay

## 2021-09-08 ENCOUNTER — Ambulatory Visit: Payer: Medicare Other

## 2021-09-08 DIAGNOSIS — L905 Scar conditions and fibrosis of skin: Secondary | ICD-10-CM | POA: Diagnosis not present

## 2021-09-08 DIAGNOSIS — R279 Unspecified lack of coordination: Secondary | ICD-10-CM | POA: Diagnosis not present

## 2021-09-08 DIAGNOSIS — R252 Cramp and spasm: Secondary | ICD-10-CM

## 2021-09-08 DIAGNOSIS — M6281 Muscle weakness (generalized): Secondary | ICD-10-CM | POA: Diagnosis not present

## 2021-09-08 DIAGNOSIS — R293 Abnormal posture: Secondary | ICD-10-CM

## 2021-09-08 DIAGNOSIS — R222 Localized swelling, mass and lump, trunk: Secondary | ICD-10-CM | POA: Diagnosis not present

## 2021-09-08 NOTE — Therapy (Signed)
[facility] @ [place], [place], [address] Phone: [phone]   Fax:  [phone]  Physical Therapy Treatment  Patient Details  Name: Laura Mcdonald MRN: [id number] Date of Birth: February 12, 1956 Referring Provider (PT): Leighton Ruff, MD   Encounter Date: 09/08/2021   PT End of Session - 09/08/21 0823     Visit Number 3    Date for PT Re-Evaluation 11/04/21    Authorization Type UHC Medicare    PT Start Time 0803    PT Stop Time 0839    PT Time Calculation (min) 36 min    Activity Tolerance Patient tolerated treatment well    Behavior During Therapy [facility] for tasks assessed/performed             Past Medical History:  Diagnosis Date   Allergy    Cancer ([place])    cervical anc rectal   GERD (gastroesophageal reflux disease)    past hx but better wirg weight loss    History of kidney stones    seen on a scan no treatment    Mild intermittent asthma    seasonal or allery induced    Past Surgical History:  Procedure Laterality Date   BIOPSY  01/15/2020   Procedure: BIOPSY;  Surgeon: Irving Copas., MD;  Location: [facility] ENDOSCOPY;  Service: Gastroenterology;;   cervical cancer surgery  1995~   Dr Stann Mainland; "WHIP surgery"   COLONOSCOPY WITH PROPOFOL N/A 01/15/2020   Procedure: COLONOSCOPY WITH PROPOFOL;  Surgeon: Irving Copas., MD;  Location: [place];  Service: Gastroenterology;  Laterality: N/A;   COLOSTOMY REVERSAL N/A 04/26/2020   Procedure: ILEOSTOMY REVERSAL;  Surgeon: Leighton Ruff, MD;  Location: [facility];  Service: General;  Laterality: N/A;   HAND SURGERY Right    SUBMUCOSAL LIFTING INJECTION  01/15/2020   Procedure: SUBMUCOSAL LIFTING INJECTION;  Surgeon: Irving Copas., MD;  Location: [place];  Service: Gastroenterology;;   tubal preganancy     XI ROBOTIC ASSISTED LOWER ANTERIOR RESECTION N/A 02/15/2020   Procedure: ROBOTIC LOW ANTERIOR RESECTION, DIVERTING  ILEOSTOMY;  Surgeon: Leighton Ruff, MD;  Location: [facility];  Service: General;  Laterality: N/A;    There were no vitals filed for this visit.   Subjective Assessment - 09/08/21 0805     Subjective Pt states that she has noticed that her abdomen is getting a little tighter and she had to put on a belt. Pt states that the spot is still there and she will still havesharp pains when shedoes not have compression garment on; there has not been anymore discharge from it, but she continues to work it in the shower.    Pertinent History LAR/colostomy reversal, ileostomy 2021, constipation, rectal cancer, cervical cancer, tubal pregnancy with surgical resolution; abdominal CT scan and colonoscopy performed to make sure there was no medical concern    Patient Stated Goals To reduce pain and be able to walk without pain.    Currently in Pain? No/denies    Multiple Pain Sites No                               [facility] Adult PT Treatment/Exercise - 09/08/21 0001       Neuro Re-ed    Neuro Re-ed Details  transversus abdominus training with VC/TCs for correct facilitation with breathing; performed with supine march to provide additional challenge, 2 x 10, with continued VC/TCs. Progressed to include  2 x 10 supine leg extensions with core control      Lumbar Exercises: Stretches   Hip Flexor Stretch Right;Left;1 rep;60 seconds   Kneeling on pillows     Lumbar Exercises: Supine   Bridge with Ball Squeeze 20 reps      Lumbar Exercises: Quadruped   Madcat/Old Horse 20 reps      Manual Therapy   Soft tissue mobilization transversus abdominus training with VC/TCs for correct facilitation with breathing; performed with supine march to provide additional challenge, 2 x 10, with continued VC/TCs.    Myofascial Release Scar tissue mobilization and mobilization with movement (bent knee fall out with scar tissue mobilization)                     PT Education - 09/08/21 0822      Education Details Pt education performed on unexpalined weight loss and to make sure she is tracking this with history of cancer; HEP not updated due to diffiuclty finding time to perform strengthening progressions at home.    Person(s) Educated Patient    Methods Explanation;Demonstration;Tactile cues;Verbal cues    Comprehension Verbalized understanding              PT Short Term Goals - 08/26/21 0914       PT SHORT TERM GOAL #1   Title Pt will be independent with HEP in 2 weeks.    Time 2    Period Weeks    Status New    Target Date 09/09/21      PT SHORT TERM GOAL #2   Title Pt will be able to perform bed mobility without any increase in abdominal pain and appropriate pressure management.    Time 4    Period Weeks    Status New    Target Date 09/23/21      PT SHORT TERM GOAL #3   Title Pt will be able to sit/stand for >20 minutes without increase abdominal pain.    Time 4    Period Weeks    Target Date 09/23/21      PT SHORT TERM GOAL #4   Title Pt will improve all impaired lumbar A/ROM by 25% in order to improve functional mobility.    Time 4    Period Weeks    Status New    Target Date 09/23/21               PT Long Term Goals - 08/26/21 0917       PT LONG TERM GOAL #1   Title Pt will be independent with advanced HEP in 8 weeks.    Time 12    Period Weeks    Status New    Target Date 11/19/21      PT LONG TERM GOAL #2   Title Pt will report no episodes of SUI with sneezing in order to reduce pad usage and improve personal hygiene.    Time 12    Period Weeks    Status New    Target Date 11/19/21      PT LONG TERM GOAL #3   Title Pt will be able to ambulate >20 minutes without increased abdominal pain in order to perform community ambulation.    Time 12    Period Weeks    Status New    Target Date 11/19/21      PT LONG TERM GOAL #4   Title Pt will improve all impaired hip strength by one muscle  grade in order to demonstrate improved  functional strength and be able to ambulate and perform stairs with less difficulty.    Time 12    Period Weeks    Status New    Target Date 11/19/21                   Plan - 09/08/21 8786     Clinical Impression Statement Pt overall doing very well demonstrated by decreasing scar tissue restriction and improved pain with functional tasks like sit<>stand. Duirng manual techniques she reported less disocmfort/pulling and notable reduction in scar tissue. Irritated nodule still present, but smaller and more diffuse. She was able to perform core progressions with appropriate transversus abdominus facilitation and lumbopelvic motor control. Progression of core exercises helping to improve overall abdominal mobility. HEP not updated, but pt encouraged to attempt getting to previous progressions more over the next week and we will plan to update next session. She will continue to benefit from skilled PT intervention in order to progress towards goal completion.    PT Treatment/Interventions ADLs/Self Care Home Management;Biofeedback;Cryotherapy;Electrical Stimulation;Moist Heat;Functional mobility training;Therapeutic activities;Therapeutic exercise;Neuromuscular re-education;Manual techniques;Patient/family education;Scar mobilization;Dry needling;Spinal Manipulations    PT Next Visit Plan Continue to progress core strengthening and moility activities; manual techniques to continue improving scar tissue restrictoin.    PT Home Exercise Plan Access Code: [email or web address]    Consulted and Agree with Plan of Care Patient             Patient will benefit from skilled therapeutic intervention in order to improve the following deficits and impairments:  Decreased coordination, Decreased range of motion, Difficulty walking, Increased fascial restricitons, Impaired tone, Decreased endurance, Increased muscle spasms, Decreased activity tolerance, Decreased scar mobility, Hypomobility, Decreased mobility,  Decreased strength, Postural dysfunction  Visit Diagnosis: Cramp and spasm  Muscle weakness (generalized)  Abnormal posture  Unspecified lack of coordination     Problem List Patient Active Problem List   Diagnosis Date Noted   Vitamin D deficiency 05/28/2021   Rectal cancer ([place]) 01/22/2020   Rectal polyp 12/31/2019   Abnormal colonoscopy 12/31/2019   Mild intermittent asthma    Cervical cancer ([place])    KNEE PAIN, RIGHT 03/13/2008   ALLERGIC RHINITIS 04/29/2007   GERD 04/29/2007    Heather Roberts, PT, DPT01/30/238:41 AM   [facility] @ [place], [place], [address] Phone: [phone]   Fax:  [phone]  Name: Laura Mcdonald MRN: [id number] Date of Birth: 08-23-1955

## 2021-09-16 ENCOUNTER — Ambulatory Visit: Payer: Medicare Other | Attending: General Surgery

## 2021-09-16 ENCOUNTER — Other Ambulatory Visit: Payer: Self-pay

## 2021-09-16 DIAGNOSIS — R293 Abnormal posture: Secondary | ICD-10-CM | POA: Diagnosis not present

## 2021-09-16 DIAGNOSIS — R279 Unspecified lack of coordination: Secondary | ICD-10-CM | POA: Insufficient documentation

## 2021-09-16 DIAGNOSIS — R252 Cramp and spasm: Secondary | ICD-10-CM | POA: Insufficient documentation

## 2021-09-16 DIAGNOSIS — M6281 Muscle weakness (generalized): Secondary | ICD-10-CM | POA: Insufficient documentation

## 2021-09-16 NOTE — Patient Instructions (Signed)
Access Code: [email or web address] URL: [email or web address] Date: 09/16/2021 Prepared by: Heather Roberts  Exercises Supine Lower Trunk Rotation - 1 x daily - 7 x weekly - 2 sets - 10 reps Sidelying Open Book Thoracic Lumbar Rotation and Extension - 1 x daily - 7 x weekly - 1 sets - 10 reps Supine Diaphragmatic Breathing - 1 x daily - 7 x weekly - 2 sets - 10 reps Supine Transversus Abdominis Bracing - Hands on Stomach - 1 x daily - 7 x weekly - 2 sets - 10 reps Cat Cow - 1 x daily - 7 x weekly - 2 sets - 10 reps Child's Pose Stretch - 1 x daily - 7 x weekly - 1 sets - 3 reps - 30 hold Supine Transversus Abdominis Bracing with Leg Extension - 1 x daily - 7 x weekly - 2 sets - 10 reps Supine Dead Bug with Leg Extension - 1 x daily - 7 x weekly - 2 sets - 10 reps

## 2021-09-16 NOTE — Therapy (Signed)
[facility] @ [place], [place], [address] Phone: [phone]   Fax:  [phone]  Physical Therapy Treatment  Patient Details  Name: Laura Mcdonald MRN: [id number] Date of Birth: 1956-07-08 Referring Provider (PT): Leighton Ruff, MD   Encounter Date: 09/16/2021   PT End of Session - 09/16/21 0756     Visit Number 4    Date for PT Re-Evaluation 11/04/21    Authorization Type UHC Medicare    Progress Note Due on Visit 10    PT Start Time 0800    PT Stop Time 0841    PT Time Calculation (min) 41 min    Activity Tolerance Patient tolerated treatment well    Behavior During Therapy [facility] for tasks assessed/performed             Past Medical History:  Diagnosis Date   Allergy    Cancer ([place])    cervical anc rectal   GERD (gastroesophageal reflux disease)    past hx but better wirg weight loss    History of kidney stones    seen on a scan no treatment    Mild intermittent asthma    seasonal or allery induced    Past Surgical History:  Procedure Laterality Date   BIOPSY  01/15/2020   Procedure: BIOPSY;  Surgeon: Irving Copas., MD;  Location: [facility] ENDOSCOPY;  Service: Gastroenterology;;   cervical cancer surgery  1995~   Dr Stann Mainland; "WHIP surgery"   COLONOSCOPY WITH PROPOFOL N/A 01/15/2020   Procedure: COLONOSCOPY WITH PROPOFOL;  Surgeon: Irving Copas., MD;  Location: [place];  Service: Gastroenterology;  Laterality: N/A;   COLOSTOMY REVERSAL N/A 04/26/2020   Procedure: ILEOSTOMY REVERSAL;  Surgeon: Leighton Ruff, MD;  Location[facility];  Service: General;  Laterality: N/A;   HAND SURGERY Right    SUBMUCOSAL LIFTING INJECTION  01/15/2020   Procedure: SUBMUCOSAL LIFTING INJECTION;  Surgeon: Irving Copas., MD;  Location: [place];  Service: Gastroenterology;;   tubal preganancy     XI ROBOTIC ASSISTED LOWER ANTERIOR RESECTION N/A 02/15/2020   Procedure: ROBOTIC LOW  ANTERIOR RESECTION, DIVERTING ILEOSTOMY;  Surgeon: Leighton Ruff, MD;  Location: [facility];  Service: General;  Laterality: N/A;    There were no vitals filed for this visit.   Subjective Assessment - 09/16/21 0801     Subjective Pt states that she is feelng better enough that she decided to leave off compressive abdominal sleeve; she states that she is feeling good without it so far. She continues to perform massage to abdomen and irritated area; she states that it is still a substantial hard spot, but there is no sharp pain. She has had several times where irritated spot has drained in the shower.    Patient Stated Goals To reduce pain and be able to walk without pain.    Currently in Pain? No/denies    Multiple Pain Sites No                               [facility] Adult PT Treatment/Exercise - 09/16/21 0001       Neuro Re-ed    Neuro Re-ed Details  transversus abdominus training with VC/TCs for correct facilitation with breathing; 2 x 10 supine leg extensions with core control      Lumbar Exercises: Standing   Row Strengthening;20 reps;Theraband   red   Shoulder Extension Strengthening;20 reps;Theraband   red  Lumbar Exercises: Supine   Bridge with Ball Squeeze 20 reps    Straight Leg Raise 10 reps   Bil     Lumbar Exercises: Quadruped   Madcat/Old Horse --      Manual Therapy   Manual Therapy Myofascial release;Soft tissue mobilization    Soft tissue mobilization all 4 abdominal quadrants    Myofascial Release abdominal scar tissue release                     PT Education - 09/16/21 0831     Education Details Pt education performed on calling MD and asking for dermatology referral due to localized drainage at site of irritation. HEP updated.    Person(s) Educated Patient    Methods Explanation;Demonstration;Tactile cues;Verbal cues;Handout    Comprehension Verbalized understanding              PT Short Term Goals - 08/26/21 0914        PT SHORT TERM GOAL #1   Title Pt will be independent with HEP in 2 weeks.    Time 2    Period Weeks    Status New    Target Date 09/09/21      PT SHORT TERM GOAL #2   Title Pt will be able to perform bed mobility without any increase in abdominal pain and appropriate pressure management.    Time 4    Period Weeks    Status New    Target Date 09/23/21      PT SHORT TERM GOAL #3   Title Pt will be able to sit/stand for >20 minutes without increase abdominal pain.    Time 4    Period Weeks    Target Date 09/23/21      PT SHORT TERM GOAL #4   Title Pt will improve all impaired lumbar A/ROM by 25% in order to improve functional mobility.    Time 4    Period Weeks    Status New    Target Date 09/23/21               PT Long Term Goals - 08/26/21 0917       PT LONG TERM GOAL #1   Title Pt will be independent with advanced HEP in 8 weeks.    Time 12    Period Weeks    Status New    Target Date 11/19/21      PT LONG TERM GOAL #2   Title Pt will report no episodes of SUI with sneezing in order to reduce pad usage and improve personal hygiene.    Time 12    Period Weeks    Status New    Target Date 11/19/21      PT LONG TERM GOAL #3   Title Pt will be able to ambulate >20 minutes without increased abdominal pain in order to perform community ambulation.    Time 12    Period Weeks    Status New    Target Date 11/19/21      PT LONG TERM GOAL #4   Title Pt will improve all impaired hip strength by one muscle grade in order to demonstrate improved functional strength and be able to ambulate and perform stairs with less difficulty.    Time 12    Period Weeks    Status New    Target Date 11/19/21                   Plan -  09/16/21 0805     Clinical Impression Statement Pt is doing very well overall with no report of sharp abdominal pain and no pulling with sit<>stand transitions; she does continue to have draining and irritation at small abdominal nodule  and was encouraged to talk with MD about dermatology referral due to being out of PT scope of practice. Scar tissue restriction is improving with manual techniques well and there is less restriction present in tissue. She is doing well with core facilitation exercises and was able to progress to leg extensions and dead bug with good form and appropriate challenge. Good tolerance to all other exercises with no increase in pain. She will continue to benefit from skilled PT intervention in order to progress functional strengthening and work towards goal completion.    PT Treatment/Interventions ADLs/Self Care Home Management;Biofeedback;Cryotherapy;Electrical Stimulation;Moist Heat;Functional mobility training;Therapeutic activities;Therapeutic exercise;Neuromuscular re-education;Manual techniques;Patient/family education;Scar mobilization;Dry needling;Spinal Manipulations    PT Next Visit Plan Continue to progress core strengthening and moility activities; manual techniques to continue improving scar tissue restrictoin.    PT Home Exercise Plan Access Code: [email or web address]    Consulted and Agree with Plan of Care Patient             Patient will benefit from skilled therapeutic intervention in order to improve the following deficits and impairments:  Decreased coordination, Decreased range of motion, Difficulty walking, Increased fascial restricitons, Impaired tone, Decreased endurance, Increased muscle spasms, Decreased activity tolerance, Decreased scar mobility, Hypomobility, Decreased mobility, Decreased strength, Postural dysfunction  Visit Diagnosis: Cramp and spasm  Muscle weakness (generalized)  Abnormal posture  Unspecified lack of coordination     Problem List Patient Active Problem List   Diagnosis Date Noted   Vitamin D deficiency 05/28/2021   Rectal cancer ([place]) 01/22/2020   Rectal polyp 12/31/2019   Abnormal colonoscopy 12/31/2019   Mild intermittent asthma    Cervical cancer  ([place])    KNEE PAIN, RIGHT 03/13/2008   ALLERGIC RHINITIS 04/29/2007   GERD 04/29/2007    Heather Roberts, PT, DPT02/07/238:42 AM   [facility] @ [place], [place], [address] Phone: [phone]   Fax:  [phone]  Name: Laura Mcdonald MRN: [id number] Date of Birth: [birth year]

## 2021-09-22 ENCOUNTER — Other Ambulatory Visit: Payer: Self-pay

## 2021-09-22 ENCOUNTER — Ambulatory Visit: Payer: Medicare Other

## 2021-09-22 DIAGNOSIS — M6281 Muscle weakness (generalized): Secondary | ICD-10-CM

## 2021-09-22 DIAGNOSIS — R252 Cramp and spasm: Secondary | ICD-10-CM

## 2021-09-22 DIAGNOSIS — R279 Unspecified lack of coordination: Secondary | ICD-10-CM | POA: Diagnosis not present

## 2021-09-22 DIAGNOSIS — R293 Abnormal posture: Secondary | ICD-10-CM

## 2021-09-22 NOTE — Therapy (Signed)
[place] @ [address], [place], [address] Phone: [phone]   Fax:  [phone]  Physical Therapy Treatment  Patient Details  Name: Laura Mcdonald MRN: [id number] Date of Birth: 08-29-1955 Referring Provider (PT): Leighton Ruff, MD   Encounter Date: 09/22/2021   PT End of Session - 09/22/21 0800     Visit Number 5    Number of Visits 858    Date for PT Re-Evaluation 11/04/21    Authorization Type [employer] Medicare    Progress Note Due on Visit 10    PT Start Time 0800    Activity Tolerance Patient tolerated treatment well    Behavior During Therapy [facility] for tasks assessed/performed             Past Medical History:  Diagnosis Date   Allergy    Cancer ([place])    cervical anc rectal   GERD (gastroesophageal reflux disease)    past hx but better wirg weight loss    History of kidney stones    seen on a scan no treatment    Mild intermittent asthma    seasonal or allery induced    Past Surgical History:  Procedure Laterality Date   BIOPSY  01/15/2020   Procedure: BIOPSY;  Surgeon: Irving Copas., MD;  Location: [facility] ENDOSCOPY;  Service: Gastroenterology;;   cervical cancer surgery  1995~   Dr Stann Mainland; "WHIP surgery"   COLONOSCOPY WITH PROPOFOL N/A 01/15/2020   Procedure: COLONOSCOPY WITH PROPOFOL;  Surgeon: Irving Copas., MD;  Location: [place];  Service: Gastroenterology;  Laterality: N/A;   COLOSTOMY REVERSAL N/A 04/26/2020   Procedure: ILEOSTOMY REVERSAL;  Surgeon: Leighton Ruff, MD;  Location: [facility];  Service: General;  Laterality: N/A;   HAND SURGERY Right    SUBMUCOSAL LIFTING INJECTION  01/15/2020   Procedure: SUBMUCOSAL LIFTING INJECTION;  Surgeon: Irving Copas., MD;  Location: [place];  Service: Gastroenterology;;   tubal preganancy     XI ROBOTIC ASSISTED LOWER ANTERIOR RESECTION N/A 02/15/2020   Procedure: ROBOTIC LOW ANTERIOR RESECTION, DIVERTING  ILEOSTOMY;  Surgeon: Leighton Ruff, MD;  Location: [facility];  Service: General;  Laterality: N/A;    There were no vitals filed for this visit.   Subjective Assessment - 09/22/21 0800     Subjective Pt states that stomach is feeling pretty good. She was having large improvement in ease of bowel movements, but had more difficulty in the last couple of days; she is wondering if that is due to dietary changes from not eating oatmeal. She has appointment to see Dr. Marcello Moores tomorrow to get further advice on irritated nodule in abdomen.    Patient Stated Goals To reduce pain and be able to walk without pain.    Currently in Pain? No/denies    Multiple Pain Sites No                [facility] PT Assessment - 09/22/21 0001       Assessment   Medical Diagnosis R22.2 (ICD-10-CM) - Localized swelling, mass and lump, trunk  L90.5 (ICD-10-CM) - Scar conditions and fibrosis of skin    Referring Provider (PT) Leighton Ruff, MD    Onset Date/Surgical Date 02/14/20                           [facility] Adult PT Treatment/Exercise - 09/22/21 0001       Neuro Re-ed    Neuro Re-ed  Details  2 x 10 supine leg extensions with core control cues      Lumbar Exercises: Standing   Row Strengthening;20 reps;Theraband    Theraband Level (Row) Level 2 (Red)    Shoulder Extension Strengthening;20 reps;Theraband    Theraband Level (Shoulder Extension) Level 2 (Red)    Other Standing Lumbar Exercises Pallof press 2 x 10 red      Lumbar Exercises: Supine   Dead Bug 20 reps   multimodal cues for core activation   Bridge with March 20 reps    Other Supine Lumbar Exercises supine weight drop with Bil UE, 5lbs, 2 x 10      Manual Therapy   Manual Therapy Myofascial release;Soft tissue mobilization    Soft tissue mobilization all 4 abdominal quadrants    Myofascial Release abdominal scar tissue release                     PT Education - 09/22/21 0829     Education Details Pt education  performed on new exercises and benefit to overall condition. HEP not updated at this time; we will plan to next visit.    Person(s) Educated Patient    Methods Explanation;Demonstration;Tactile cues;Verbal cues    Comprehension Verbalized understanding              PT Short Term Goals - 08/26/21 0914       PT SHORT TERM GOAL #1   Title Pt will be independent with HEP in 2 weeks.    Time 2    Period Weeks    Status New    Target Date 09/09/21      PT SHORT TERM GOAL #2   Title Pt will be able to perform bed mobility without any increase in abdominal pain and appropriate pressure management.    Time 4    Period Weeks    Status New    Target Date 09/23/21      PT SHORT TERM GOAL #3   Title Pt will be able to sit/stand for >20 minutes without increase abdominal pain.    Time 4    Period Weeks    Target Date 09/23/21      PT SHORT TERM GOAL #4   Title Pt will improve all impaired lumbar A/ROM by 25% in order to improve functional mobility.    Time 4    Period Weeks    Status New    Target Date 09/23/21               PT Long Term Goals - 08/26/21 0917       PT LONG TERM GOAL #1   Title Pt will be independent with advanced HEP in 8 weeks.    Time 12    Period Weeks    Status New    Target Date 11/19/21      PT LONG TERM GOAL #2   Title Pt will report no episodes of SUI with sneezing in order to reduce pad usage and improve personal hygiene.    Time 12    Period Weeks    Status New    Target Date 11/19/21      PT LONG TERM GOAL #3   Title Pt will be able to ambulate >20 minutes without increased abdominal pain in order to perform community ambulation.    Time 12    Period Weeks    Status New    Target Date 11/19/21  PT LONG TERM GOAL #4   Title Pt will improve all impaired hip strength by one muscle grade in order to demonstrate improved functional strength and be able to ambulate and perform stairs with less difficulty.    Time 12    Period  Weeks    Status New    Target Date 11/19/21                   Plan - 09/22/21 0824     Clinical Impression Statement Pt continues to make good progress demonstrated by very little, if any, discomfort with ambulation, sitting, or trasnitions from sit>stand. Abdominal scar tissue mobility improves weekly; however, irritated nodule in Rt upper quadrant continues to be present. This spot has reduced in size as it has been draining with increased self-abdoinal massage. Pt was encouraged to make an appointment with MD to get this place re-evaluated as it seems like there is something locally going on in the skin. She is making good strength gains in core and hips with excellent awareness of appropriate form and breath coordination throughout. She is progressing towards goal completion well, but will continue to benefit form skilled PT intervention in order to progress functional strengthening program and continue to improve scar tissue mobility.    PT Treatment/Interventions ADLs/Self Care Home Management;Biofeedback;Cryotherapy;Electrical Stimulation;Moist Heat;Functional mobility training;Therapeutic activities;Therapeutic exercise;Neuromuscular re-education;Manual techniques;Patient/family education;Scar mobilization;Dry needling;Spinal Manipulations    PT Next Visit Plan Continue to progress core strengthening and moility activities; manual techniques to continue improving scar tissue restrictoin.    PT Home Exercise Plan Access Code: [email or web address]    Consulted and Agree with Plan of Care Patient             Patient will benefit from skilled therapeutic intervention in order to improve the following deficits and impairments:  Decreased coordination, Decreased range of motion, Difficulty walking, Increased fascial restricitons, Impaired tone, Decreased endurance, Increased muscle spasms, Decreased activity tolerance, Decreased scar mobility, Hypomobility, Decreased mobility, Decreased strength,  Postural dysfunction  Visit Diagnosis: Cramp and spasm  Abnormal posture  Unspecified lack of coordination  Muscle weakness (generalized)     Problem List Patient Active Problem List   Diagnosis Date Noted   Vitamin D deficiency 05/28/2021   Rectal cancer ([place]) 01/22/2020   Rectal polyp 12/31/2019   Abnormal colonoscopy 12/31/2019   Mild intermittent asthma    Cervical cancer ([place])    KNEE PAIN, RIGHT 03/13/2008   ALLERGIC RHINITIS 04/29/2007   GERD 04/29/2007   Heather Roberts, PT, DPT02/13/238:41 AM   [place] @ [place], [place], [address] Phone: [phone]   Fax:  [phone]  Name: Laura Mcdonald MRN: [id number] Date of Birth: 22-Jun-1956

## 2021-09-23 DIAGNOSIS — L723 Sebaceous cyst: Secondary | ICD-10-CM | POA: Diagnosis not present

## 2021-09-29 ENCOUNTER — Other Ambulatory Visit: Payer: Self-pay

## 2021-09-29 ENCOUNTER — Ambulatory Visit: Payer: Medicare Other

## 2021-10-06 DIAGNOSIS — L723 Sebaceous cyst: Secondary | ICD-10-CM | POA: Diagnosis not present

## 2021-10-20 ENCOUNTER — Telehealth: Payer: Self-pay

## 2021-10-20 ENCOUNTER — Ambulatory Visit: Payer: Medicare Other | Attending: General Surgery

## 2021-10-20 DIAGNOSIS — R279 Unspecified lack of coordination: Secondary | ICD-10-CM | POA: Insufficient documentation

## 2021-10-20 DIAGNOSIS — M6281 Muscle weakness (generalized): Secondary | ICD-10-CM | POA: Insufficient documentation

## 2021-10-20 DIAGNOSIS — R252 Cramp and spasm: Secondary | ICD-10-CM | POA: Insufficient documentation

## 2021-10-20 DIAGNOSIS — R293 Abnormal posture: Secondary | ICD-10-CM | POA: Insufficient documentation

## 2021-10-20 NOTE — Telephone Encounter (Signed)
Called for patient due to no-show today (4th consecutive cancel/no-show), but unable to get through or leave message. Wrote MyChart message to her explaining cancellation/no-show history and to please give [place] a call.   ?

## 2021-11-17 ENCOUNTER — Other Ambulatory Visit: Payer: Self-pay | Admitting: Internal Medicine

## 2021-11-17 DIAGNOSIS — E559 Vitamin D deficiency, unspecified: Secondary | ICD-10-CM

## 2021-12-01 ENCOUNTER — Other Ambulatory Visit: Payer: Self-pay | Admitting: *Deleted

## 2021-12-01 ENCOUNTER — Other Ambulatory Visit: Payer: Self-pay | Admitting: Internal Medicine

## 2021-12-01 ENCOUNTER — Other Ambulatory Visit (INDEPENDENT_AMBULATORY_CARE_PROVIDER_SITE_OTHER): Payer: Medicare Other

## 2021-12-01 DIAGNOSIS — E559 Vitamin D deficiency, unspecified: Secondary | ICD-10-CM

## 2021-12-01 LAB — VITAMIN D 25 HYDROXY (VIT D DEFICIENCY, FRACTURES): VITD: 29.18 ng/mL — ABNORMAL LOW (ref 30.00–100.00)

## 2021-12-01 MED ORDER — VITAMIN D (ERGOCALCIFEROL) 1.25 MG (50000 UNIT) PO CAPS: 50000.0000 [IU] | ORAL_CAPSULE | ORAL | 0 refills | Status: AC

## 2021-12-09 ENCOUNTER — Ambulatory Visit: Payer: Medicare Other

## 2021-12-09 NOTE — Therapy (Incomplete)
?OUTPATIENT PHYSICAL THERAPY FEMALE PELVIC EVALUATION ? ? ?Patient Name: Laura Mcdonald ?MRN: [id number] ?DOB:[birth year], 66 y.o., female ?Today's Date: 12/09/2021 ? ? ? ?Past Medical History:  ?Diagnosis Date  ? Allergy   ? Cancer [facility])   ? cervical anc rectal  ? GERD (gastroesophageal reflux disease)   ? past hx but better wirg weight loss   ? History of kidney stones   ? seen on a scan no treatment   ? Mild intermittent asthma   ? seasonal or allery induced  ? ?Past Surgical History:  ?Procedure Laterality Date  ? BIOPSY  01/15/2020  ? Procedure: BIOPSY;  Surgeon: Irving Copas., MD;  Location: [facility] ENDOSCOPY;  Service: Gastroenterology;;  ? cervical cancer surgery  1995~  ? Dr Stann Mainland; "WHIP surgery"  ? COLONOSCOPY WITH PROPOFOL N/A 01/15/2020  ? Procedure: COLONOSCOPY WITH PROPOFOL;  Surgeon: Mansouraty, Telford Nab., MD;  Location: [place];  Service: Gastroenterology;  Laterality: N/A;  ? COLOSTOMY REVERSAL N/A 04/26/2020  ? Procedure: ILEOSTOMY REVERSAL;  Surgeon: Leighton Ruff, MD;  [facility];  Service: General;  Laterality: N/A;  ? HAND SURGERY Right   ? SUBMUCOSAL LIFTING INJECTION  01/15/2020  ? Procedure: SUBMUCOSAL LIFTING INJECTION;  Surgeon: Irving Copas., MD;  Location: [place];  Service: Gastroenterology;;  ? tubal preganancy    ? XI ROBOTIC ASSISTED LOWER ANTERIOR RESECTION N/A 02/15/2020  ? Procedure: ROBOTIC LOW ANTERIOR RESECTION, DIVERTING ILEOSTOMY;  Surgeon: Leighton Ruff, MD;  [facility];  Service: General;  Laterality: N/A;  ? ?Patient Active Problem List  ? Diagnosis Date Noted  ? Vitamin D deficiency 05/28/2021  ? Rectal cancer ([place]) 01/22/2020  ? Rectal polyp 12/31/2019  ? Abnormal colonoscopy 12/31/2019  ? Mild intermittent asthma   ? Cervical cancer ([place])   ? KNEE PAIN, RIGHT 03/13/2008  ? ALLERGIC RHINITIS 04/29/2007  ? GERD 04/29/2007  ? ? ?PCP: Isaac Bliss, Rayford Halsted, MD ? ?REFERRING PROVIDER: Georganna Skeans, MD ? ?REFERRING DIAG: L90.5  (ICD-10-CM) - Scar conditions and fibrosis of skin ? ?THERAPY DIAG:  ?No diagnosis found. ? ?ONSET DATE: 10/06/21 ? ?SUBJECTIVE:                                                                                                                                                                                          ? ?SUBJECTIVE STATEMENT: ?Patient is returning to physical therapy after removal of sebaceous cyst 10/06/21.  ?Fluid intake: Yes: ***  ? ?Patient confirms identification and approves PT to assess pelvic floor and treatment Yes ? ? ?PAIN:  ?Are you having pain? {yes/no:20286} ?NPRS scale: ***/10 ? ? ?PRECAUTIONS: None ? ?WEIGHT  BEARING RESTRICTIONS No ? ?FALLS:  ?Has patient fallen in last 6 months? No ? ?LIVING ENVIRONMENT: ?Lives with: lives with their family ?Lives in: House/apartment ? ?OCCUPATION: Retired ? ?PLOF: Independent ? ?PATIENT GOALS *** ? ?PERTINENT HISTORY:  ?Sebaceous cyst removal 10/06/21, LAR/colostomy reversal, ileostomy 2021, rectal cancer, cervical cancer, tubal pregnancy with surgical resolution  ?Sexual abuse: No ? ?BOWEL MOVEMENT ?Pain with bowel movement: {yes/no:20286} ?Type of bowel movement:{PT BM type:27100} ?Fully empty rectum: {Yes/No:304960894} ?Leakage: {Yes/No:304960894} ?Pads: {Yes/No:304960894} ?Fiber supplement: {Yes/No:304960894} ? ?URINATION ?Pain with urination: {yes/no:20286} ?Fully empty bladder: {Yes/No:304960894} ?Stream: {PT urination:27102} ?Urgency: {Yes/No:304960894} ?Frequency: *** ?Leakage: {PT leakage:27103} ?Pads: {Yes/No:304960894} ? ?INTERCOURSE ?Pain with intercourse: {pain with intercourse PA:27099} ?Ability to have vaginal penetration:  {Yes/No:304960894} ?[place]: *** ?Marinoff Scale: ***/3 ? ?PREGNANCY ?Vaginal deliveries *** ?Tearing {Yes***/No:304960894} ?C-section deliveries *** ?Currently pregnant {Yes***/No:304960894} ? ?PROLAPSE ?{PT prolapse:27101} ? ? ? ?OBJECTIVE:  ? ?DIAGNOSTIC FINDINGS:  ?*** ? ?PATIENT SURVEYS:  ?{rehab  surveys:24030} ? ?PFIQ-7 *** ? ?COGNITION: ? Overall cognitive status: Within functional limits for tasks assessed   ?  ?SENSATION: ? Light touch: Appears intact ? Proprioception: Appears intact ? ?MUSCLE LENGTH: ?Hamstrings: Right *** deg; Left *** deg ?Thomas test: Right *** deg; Left *** deg ? ? ?FUNCTIONAL TESTS:  ?{Functional tests:24029} ? ?GAIT: ? ? ?POSTURE:  ?*** ? ?LUMBARAROM/PROM ? ? ? ?LE ROM: ? ? ? ?LE MMT: ? ? ?PELVIC MMT: ?  ? ? ?      PALPATION: ?  General  *** ? ?              External Perineal Exam *** ?              ?              Internal Pelvic Floor *** ? ?TONE: ?*** ? ?PROLAPSE: ?*** ? ?TODAY'S TREATMENT  ?EVAL *** ? ? ?PATIENT EDUCATION:  ?Education details: *** ?Person educated: Patient ?Education method: Explanation, Demonstration, Tactile cues, Verbal cues, and Handouts ?Education comprehension: verbalized understanding ? ? ?HOME EXERCISE PROGRAM: ?[id number] ? ?ASSESSMENT: ? ?CLINICAL IMPRESSION: ?Patient is a 66 y.o. female who was seen today for physical therapy evaluation and treatment for ***.  ? ? ?OBJECTIVE IMPAIRMENTS {opptimpairments:25111}.  ? ?ACTIVITY LIMITATIONS {activity limitations:25113}.  ? ?PERSONAL FACTORS 3+ comorbidities: LAR/colostomy reversal, ileostomy 2021, rectal cancer, cervical cancer, tubal pregnancy with surgical resolution   are also affecting patient's functional outcome.  ? ? ?REHAB POTENTIAL: Good ? ?CLINICAL DECISION MAKING: Stable/uncomplicated ? ?EVALUATION COMPLEXITY: Low ? ? ?GOALS: ?Goals reviewed with patient? Yes ? ?SHORT TERM GOALS: Target date: 01/06/2022 ? ?Pt will be independent with HEP.  ? ?Baseline: ?Goal status: INITIAL ? ?2.  Pt will be able to perform bed mobility without any increase in abdominal pain and appropriate pressure management.  ?Baseline:  ?Goal status: INITIAL ? ?3.  Pt will be able to sit/stand for >20 minutes without increase abdominal pain. ?Baseline:  ?Goal status: INITIAL ? ?4.  Pt will improve all impaired lumbar A/ROM by  25% in order to improve functional mobility. ?Baseline:  ?Goal status: INITIAL ? ?5.  *** ?Baseline:  ?Goal status: {GOALSTATUS:25110} ? ?6.  *** ?Baseline:  ?Goal status: {GOALSTATUS:25110} ? ?LONG TERM GOALS: Target date: 02/17/2022 ? ?Pt will be independent with advanced HEP.  ? ?Baseline:  ?Goal status: INITIAL ? ?2.  Pt will report no episodes of SUI with sneezing in order to reduce pad usage and improve personal hygiene ?Baseline:  ?Goal status: INITIAL ? ?3.  Pt will be  able to ambulate >20 minutes without increased abdominal pain in order to perform community ambulation. ?Baseline:  ?Goal status: INITIAL ? ?4.  Pt will improve all impaired hip strength by one muscle grade in order to demonstrate improved functional strength and be able to ambulate and perform stairs with less difficulty. ?Baseline:  ?Goal status: INITIAL ? ?5.  *** ?Baseline:  ?Goal status: {GOALSTATUS:25110} ? ?6.  *** ?Baseline:  ?Goal status: {GOALSTATUS:25110} ? ?PLAN: ?PT FREQUENCY: 1x/week ? ?PT DURATION: 10 weeks ? ?PLANNED INTERVENTIONS: Therapeutic exercises, Therapeutic activity, Neuromuscular re-education, Balance training, Gait training, Patient/Family education, Joint mobilization, Dry Needling, Biofeedback, and Manual therapy ? ?PLAN FOR NEXT SESSION: *** ? ?Heather Roberts, PT, DPT05/02/237:45 AM ? ? ?

## 2021-12-15 ENCOUNTER — Telehealth: Payer: Self-pay | Admitting: Internal Medicine

## 2021-12-15 DIAGNOSIS — J4520 Mild intermittent asthma, uncomplicated: Secondary | ICD-10-CM

## 2021-12-15 MED ORDER — ALBUTEROL SULFATE HFA 108 (90 BASE) MCG/ACT IN AERS: 2.0000 | INHALATION_SPRAY | Freq: Four times a day (QID) | RESPIRATORY_TRACT | 0 refills | Status: DC | PRN

## 2021-12-15 NOTE — Telephone Encounter (Signed)
Patient would like a refill on albuterol (VENTOLIN HFA) 108 (90 Base) MCG/ACT inhaler ? ? ?Patient is having wheezing due to allergies, she states that her allergies have been especially bad and it feels that there is nothing coming out of the canister when she presses it. She isn't sure if the albuterol isn't working or if its the canister, but the albuterol was working before she got this canister.  ? ?Pharmacy will not refill without authorization from [place] ? ? ? ?Please send to  ?[facility] [phone] - [place] Phone:  [phone]  ?Fax:  [phone]  ?  ? ? ? ? ? ? ?Please advise  ?

## 2021-12-15 NOTE — Telephone Encounter (Signed)
Refill is sent

## 2021-12-22 ENCOUNTER — Ambulatory Visit: Payer: Medicare Other | Admitting: Physical Therapy

## 2021-12-25 ENCOUNTER — Ambulatory Visit: Payer: Medicare Other | Attending: General Surgery

## 2021-12-25 DIAGNOSIS — R279 Unspecified lack of coordination: Secondary | ICD-10-CM | POA: Insufficient documentation

## 2021-12-25 DIAGNOSIS — M6281 Muscle weakness (generalized): Secondary | ICD-10-CM | POA: Diagnosis not present

## 2021-12-25 DIAGNOSIS — R252 Cramp and spasm: Secondary | ICD-10-CM | POA: Diagnosis not present

## 2021-12-25 NOTE — Therapy (Signed)
OUTPATIENT PHYSICAL THERAPY FEMALE PELVIC EVALUATION   Patient Name: Laura Mcdonald MRN: [id number] DOB:[birth year], 66 y.o., female Today's Date: 12/25/2021   PT End of Session - 12/25/21 0943     Visit Number 6    Date for PT Re-Evaluation 03/05/22    Authorization Type [employer] Medicare    Progress Note Due on Visit 10    PT Start Time 0800    PT Stop Time 0843    PT Time Calculation (min) 43 min    Activity Tolerance Patient tolerated treatment well    Behavior During Therapy WFL for tasks assessed/performed             Past Medical History:  Diagnosis Date   Allergy    Cancer ([place])    cervical anc rectal   GERD (gastroesophageal reflux disease)    past hx but better wirg weight loss    History of kidney stones    seen on a scan no treatment    Mild intermittent asthma    seasonal or allery induced   Past Surgical History:  Procedure Laterality Date   BIOPSY  01/15/2020   Procedure: BIOPSY;  Surgeon: Irving Copas., MD;  Location: [facility];  Service: Gastroenterology;;   cervical cancer surgery  1995~   Dr Stann Mainland; "WHIP surgery"   COLONOSCOPY WITH PROPOFOL N/A 01/15/2020   Procedure: COLONOSCOPY WITH PROPOFOL;  Surgeon: Irving Copas., MD;  Location: [place];  Service: Gastroenterology;  Laterality: N/A;   COLOSTOMY REVERSAL N/A 04/26/2020   Procedure: ILEOSTOMY REVERSAL;  Surgeon: Leighton Ruff, MD;  Location: [facility];  Service: General;  Laterality: N/A;   HAND SURGERY Right    SUBMUCOSAL LIFTING INJECTION  01/15/2020   Procedure: SUBMUCOSAL LIFTING INJECTION;  Surgeon: Irving Copas., MD;  Location: [place];  Service: Gastroenterology;;   tubal preganancy     XI ROBOTIC ASSISTED LOWER ANTERIOR RESECTION N/A 02/15/2020   Procedure: ROBOTIC LOW ANTERIOR RESECTION, DIVERTING ILEOSTOMY;  Surgeon: Leighton Ruff, MD;  Location: [facility];  Service: General;  Laterality: N/A;   Patient Active Problem List   Diagnosis Date Noted    Vitamin D deficiency 05/28/2021   Rectal cancer ([place]) 01/22/2020   Rectal polyp 12/31/2019   Abnormal colonoscopy 12/31/2019   Mild intermittent asthma    Cervical cancer ([place])    KNEE PAIN, RIGHT 03/13/2008   ALLERGIC RHINITIS 04/29/2007   GERD 04/29/2007    PCP: Isaac Bliss, Rayford Halsted, MD  REFERRING PROVIDER: Georganna Skeans, MD  REFERRING DIAG: L90.5 (ICD-10-CM) - Scar conditions and fibrosis of skin  THERAPY DIAG:  Cramp and spasm  Unspecified lack of coordination  Muscle weakness (generalized)  ONSET DATE: 02/14/20  SUBJECTIVE:  SUBJECTIVE STATEMENT: Patient returns for PT after local excision of sebaceous cyst that developed after ileostomy reversal. She states that her husband has been diagnosed with metastatic prostate cancer and life has been very stressful. She states that the stress impacts her abdominal. She is still having pain with pants over scar tissue. She will have sharp pain at ileostomy side when she bends forwards, getting out of a chair, and walking. With walking, she feels weakness in the front of her Rt hip and LE.    Patient confirms identification and approves PT to assess pelvic floor and treatment Yes   PAIN:  Are you having pain? Yes NPRS scale: 5/10  PRECAUTIONS: None  WEIGHT BEARING RESTRICTIONS No  FALLS:  Has patient fallen in last 6 months? No  LIVING ENVIRONMENT: Lives with: lives with their family Lives in: House/apartment   OCCUPATION: Retired  PLOF: Independent  PATIENT GOALS To decrease pain  PERTINENT HISTORY:  LAR/colostomy reversal, ileostomy 2021, constipation, rectal cancer, cervical cancer, tubal pregnancy with surgical resolution Sexual abuse: No  BOWEL MOVEMENT Pain with bowel movement: No Type of bowel movement:Frequency  every time she urinates (painful to hold) and Strain No Fully empty rectum: No Leakage: No Pads: No Fiber supplement: No  URINATION Pain with urination: No Fully empty bladder: No Stream: Strong Urgency: Yes: when she is in the bathroom Frequency: every 2 hours - not sure; nocturnal enuresis with pad and 2x/night nocturia - changes pad each time she gets up to urinate Leakage: Urge to void, Coughing, Sneezing, Laughing, and walking Pads: Yes: 4-5  INTERCOURSE Pain with intercourse:  not since cancer diagnosis of husband Ability to have vaginal penetration:  Yes: - Climax: yes   PREGNANCY Vaginal deliveries 5 Tearing Yes: - C-section deliveries 0 Currently pregnant No    OBJECTIVE:   COGNITION:  Overall cognitive status: Within functional limits for tasks assessed     SENSATION:  Light touch: Appears intact  Proprioception: Appears intact   FUNCTIONAL TESTS:  Sit-stand: pulling in Rt abdomen  GAIT:  Comments: WNL  POSTURE:  Forward head/rounded shoulders, thoracic kyphosis, posterior pelvic tilt  LUMBARAROM/PROM  A/PROM A/PROM  12/25/2021  Flexion 50%  Extension 50%  Right lateral flexion 75%  Left lateral flexion 75%  Right rotation 50%, pull in Rt  Left rotation 75%   (Blank rows = not tested)   LE MMT:  MMT Right 12/25/2021 Left 12/25/2021  Hip flexion 3+ 4+  Hip extension 3, pain in anterior Rt hip/lower abodmen 4+  Hip abduction 3-, Rt lower quadrant pressure 4+  Hip adduction 3 4+  Hip internal rotation 4- 4-  Hip external rotation 4- 4-  Knee flexion    Knee extension    Ankle dorsiflexion    Ankle plantarflexion    Ankle inversion    Ankle eversion     PELVIC MMT:  will assess in future sessions          PALPATION:   General  tenderness throughout rt abdomen and anterior Rt hip/groin; some tenderness into lateral Rt hip and anterior Rt thigh    TODAY'S TREATMENT 12/25/21 EVAL  Manual: Soft tissue mobilization: Scar tissue  mobilization: Myofascial release: Spinal mobilization: Internal pelvic floor techniques: Dry needling: Neuromuscular re-education: Core retraining:  Core facilitation: Form correction: Pelvic floor contraction training: Down training: Diaphragmatic breathing Exercises: Stretches/mobility: Lower trunk mobility 3 x 10 Bent knee fall out 10x bil Open books 10x bil Cat cow 10x Seated side bend 10x bil Strengthening: Therapeutic activities:  Functional strengthening activities: Self-care: Urge suppression technique Double-voiding    PATIENT EDUCATION:  Education details: See above self-care Person educated: Patient Education method: Explanation, Demonstration, Tactile cues, Verbal cues, and Handouts Education comprehension: verbalized understanding   HOME EXERCISE PROGRAM: [id number]  ASSESSMENT:  CLINICAL IMPRESSION: Patient is a 66 y.o. female who was seen today for physical therapy evaluation and treatment for Rt abdominal pain and difficulty with walking, sit<>stand, and bending positions. Exam findings notable for significant scar tissue restriction in Rt side of abdomen, tenderness throughout Rt abdomen and Rt anterior hip, significant Rt hip weakness, decreased lumbar A/ROM decreased posterior Rt hip flexibility; many of urinary symptoms indicate pelvic floor dysfunction and we discussed performing pelvic floor exam in the future. Signs and symptoms are most consistent with abdominal scar tissue, significant core/hip weakness, and altered movement patterns due to myofascial imbalances. Initial treatment included urge suppression technique, double voiding, and gentle mobility exercises for abdomen.    OBJECTIVE IMPAIRMENTS decreased activity tolerance, decreased coordination, decreased endurance, decreased mobility, decreased ROM, decreased strength, hypomobility, increased fascial restrictions, increased muscle spasms, impaired flexibility, and pain.   ACTIVITY  LIMITATIONS  sit<>stand, walking, bending .   PERSONAL FACTORS 3+ comorbidities: LAR/colostomy reversal, ileostomy 2021, constipation, rectal cancer, cervical cancer, tubal pregnancy with surgical resolution, excision of sebaceous cyst  are also affecting patient's functional outcome.    REHAB POTENTIAL: Good  CLINICAL DECISION MAKING: Stable/uncomplicated  EVALUATION COMPLEXITY: Low   GOALS: Goals reviewed with patient? Yes  SHORT TERM GOALS: Target date: 01/22/2022    Pt will be independent with HEP.   Baseline: Goal status: INITIAL  2.  Pt will be able to perform bed mobility and sit<>stand without any increase in abdominal pain and appropriate pressure management.  Baseline:  Goal status: INITIAL    LONG TERM GOALS: Target date: 03/05/2022    Pt will be independent with advanced HEP.   Baseline:  Goal status: INITIAL  2.  Pt will improve all impaired lumbar A/ROM by 25% in order to improve functional mobility.  Baseline:  Goal status: INITIAL  3.  Pt will report no episodes of SUI with sneezing, laughing, coughing, walking in order to reduce pad usage and improve personal hygiene.  Baseline:  Goal status: INITIAL  4.  Patient will report no episodes of nocturnal enuresis and no greater than 1x/night nocturia in order to get better rest.  Baseline:  Goal status: INITIAL  5.  Pt will be able to ambulate >20 minutes without increased abdominal pain in order to perform community ambulation.  Baseline:  Goal status: INITIAL  6.  Pt will improve all impaired hip strength by one muscle grade in order to demonstrate improved functional strength and be able to ambulate and perform stairs with less difficulty. Baseline:  Goal status: INITIAL  7.  Pt will report no greater than 1/10 Rt abdominal pain and improved scar tissue mobility.  Baseline:  Goal status: INITIAL  PLAN: PT FREQUENCY: 1x/week  PT DURATION: 10 weeks  PLANNED INTERVENTIONS: Therapeutic  exercises, Therapeutic activity, Neuromuscular re-education, Balance training, Gait training, Patient/Family education, Joint mobilization, Dry Needling, Biofeedback, and Manual therapy  PLAN FOR NEXT SESSION: Plan to perform manual techniques to abdomen to improve mobility; progress mobility and begin gentle strengthening. Consider pelvic floor exam.    Heather Roberts, PT, DPT05/18/2310:13 AM

## 2021-12-25 NOTE — Patient Instructions (Signed)
Urge suppression technique: A technique to help you hold urine until it's an appropriate time to go, whether this is making it home or trying to reach a specific voiding time frame according to your schedule. It helps to send signals from the bladder to the brain that say you don't actually have to void urine right now. This most likely only give you several minutes of relief at first, but repeat as needed; the benefit will last longer as you use this technique more and get into better bladder habits. ?  The technique: o Perform 5 quick flicks (Kegels) rapidly, not worrying about fully relaxing in between each (only in this technique). o Then perform several deep belly breaths while focusing on relaxing the pelvic floor. o Go do something else to help distract yourself from the urge to urinate. o Repeat as needed.  Double-voiding:  This technique is to help with post-void dribbling, or leaking a little bit when you stand up right after urinating.  Use relaxed toileting mechanics to urinate as much as you feel like you have to without straining.  Sit back upright from leaning forward and relax this way for 10-20 seconds.  Lean forward again to finish voiding any amount more.

## 2022-01-12 DIAGNOSIS — Z1231 Encounter for screening mammogram for malignant neoplasm of breast: Secondary | ICD-10-CM | POA: Diagnosis not present

## 2022-01-12 LAB — HM PAP SMEAR

## 2022-01-12 LAB — HM MAMMOGRAPHY

## 2022-01-14 ENCOUNTER — Ambulatory Visit: Payer: Medicare Other | Attending: General Surgery

## 2022-01-14 DIAGNOSIS — R279 Unspecified lack of coordination: Secondary | ICD-10-CM | POA: Insufficient documentation

## 2022-01-14 DIAGNOSIS — R293 Abnormal posture: Secondary | ICD-10-CM | POA: Diagnosis not present

## 2022-01-14 DIAGNOSIS — R252 Cramp and spasm: Secondary | ICD-10-CM | POA: Insufficient documentation

## 2022-01-14 DIAGNOSIS — M6281 Muscle weakness (generalized): Secondary | ICD-10-CM | POA: Insufficient documentation

## 2022-01-14 NOTE — Therapy (Signed)
OUTPATIENT PHYSICAL THERAPY TREATMENT NOTE   Patient Name: Laura Mcdonald MRN: [id number] DOB:March 05, 1956, 66 y.o., female Today's Date: 01/14/2022  PCP: Isaac Bliss, Rayford Halsted, MD REFERRING PROVIDER: Georganna Skeans, MD  END OF SESSION:   PT End of Session - 01/14/22 1059     Visit Number 7    Date for PT Re-Evaluation 03/05/22    Authorization Type [employer] Medicare    PT Start Time 1100    PT Stop Time 1140    PT Time Calculation (min) 40 min    Activity Tolerance Patient tolerated treatment well    Behavior During Therapy WFL for tasks assessed/performed             Past Medical History:  Diagnosis Date   Allergy    Cancer ([place])    cervical anc rectal   GERD (gastroesophageal reflux disease)    past hx but better wirg weight loss    History of kidney stones    seen on a scan no treatment    Mild intermittent asthma    seasonal or allery induced   Past Surgical History:  Procedure Laterality Date   BIOPSY  01/15/2020   Procedure: BIOPSY;  Surgeon: Irving Copas., MD;  Location: [facility];  Service: Gastroenterology;;   cervical cancer surgery  1995~   Dr Stann Mainland; "WHIP surgery"   COLONOSCOPY WITH PROPOFOL N/A 01/15/2020   Procedure: COLONOSCOPY WITH PROPOFOL;  Surgeon: Irving Copas., MD;  Location: [place];  Service: Gastroenterology;  Laterality: N/A;   COLOSTOMY REVERSAL N/A 04/26/2020   Procedure: ILEOSTOMY REVERSAL;  Surgeon: Leighton Ruff, MD;  Location: [facility];  Service: General;  Laterality: N/A;   HAND SURGERY Right    SUBMUCOSAL LIFTING INJECTION  01/15/2020   Procedure: SUBMUCOSAL LIFTING INJECTION;  Surgeon: Irving Copas., MD;  Location: [place];  Service: Gastroenterology;;   tubal preganancy     XI ROBOTIC ASSISTED LOWER ANTERIOR RESECTION N/A 02/15/2020   Procedure: ROBOTIC LOW ANTERIOR RESECTION, DIVERTING ILEOSTOMY;  Surgeon: Leighton Ruff, MD;  Location: [facility];  Service: General;  Laterality: N/A;    Patient Active Problem List   Diagnosis Date Noted   Vitamin D deficiency 05/28/2021   Rectal cancer ([place]) 01/22/2020   Rectal polyp 12/31/2019   Abnormal colonoscopy 12/31/2019   Mild intermittent asthma    Cervical cancer ([place])    KNEE PAIN, RIGHT 03/13/2008   ALLERGIC RHINITIS 04/29/2007   GERD 04/29/2007    REFERRING DIAG: L90.5 (ICD-10-CM) - Scar conditions and fibrosis of skin  THERAPY DIAG:  Cramp and spasm  Unspecified lack of coordination  Muscle weakness (generalized)  Abnormal posture  Rationale for Evaluation and Treatment Rehabilitation  PERTINENT HISTORY: LAR/colostomy reversal, ileostomy 2021, constipation, rectal cancer, cervical cancer, tubal pregnancy with surgical resolution  PRECAUTIONS: NA  SUBJECTIVE: Patient is still having tenderness, but does not want to describe as pain. She is working through bowel regiment and trying to avoid laxatives. She is beginning to feel really good overall. Her husband starts radiation this afternoon. She has been doing urge suppression technique to help control urgency and states that it is very helpful. She also feels like double voiding is very helpful to completely empty her bladder.   PAIN:  Are you having pain? No   SUBJECTIVE:  SUBJECTIVE STATEMENT: Patient returns for PT after local excision of sebaceous cyst that developed after ileostomy reversal. She states that her husband has been diagnosed with metastatic prostate cancer and life has been very stressful. She states that the stress impacts her abdominal. She is still having pain with pants over scar tissue. She will have sharp pain at ileostomy side when she bends forwards, getting out of a chair, and walking. With walking, she feels weakness in the front of her Rt hip and LE.       Patient confirms identification and approves PT to assess pelvic floor and treatment Yes     PAIN:  Are you having pain? Yes NPRS scale: 5/10   PRECAUTIONS: None   WEIGHT BEARING RESTRICTIONS No   FALLS:  Has patient fallen in last 6 months? No   LIVING ENVIRONMENT: Lives with: lives with their family Lives in: House/apartment     OCCUPATION: Retired   PLOF: Independent   PATIENT GOALS To decrease pain   PERTINENT HISTORY:  LAR/colostomy reversal, ileostomy 2021, constipation, rectal cancer, cervical cancer, tubal pregnancy with surgical resolution Sexual abuse: No   BOWEL MOVEMENT Pain with bowel movement: No Type of bowel movement:Frequency every time she urinates (painful to hold) and Strain No Fully empty rectum: No Leakage: No Pads: No Fiber supplement: No   URINATION Pain with urination: No Fully empty bladder: No Stream: Strong Urgency: Yes: when she is in the bathroom Frequency: every 2 hours - not sure; nocturnal enuresis with pad and 2x/night nocturia - changes pad each time she gets up to urinate Leakage: Urge to void, Coughing, Sneezing, Laughing, and walking Pads: Yes: 4-5   INTERCOURSE Pain with intercourse:  not since cancer diagnosis of husband Ability to have vaginal penetration:  Yes: - Climax: yes     PREGNANCY Vaginal deliveries 5 Tearing Yes: - C-section deliveries 0 Currently pregnant No       OBJECTIVE:    COGNITION:            Overall cognitive status: Within functional limits for tasks assessed                          SENSATION:            Light touch: Appears intact            Proprioception: Appears intact     FUNCTIONAL TESTS:  Sit-stand: pulling in Rt abdomen   GAIT:   Comments: WNL   POSTURE:  Forward head/rounded shoulders, thoracic kyphosis, posterior pelvic tilt   LUMBARAROM/PROM   A/PROM A/PROM  12/25/2021  Flexion 50%  Extension 50%  Right lateral flexion 75%  Left lateral flexion 75%   Right rotation 50%, pull in Rt  Left rotation 75%   (Blank rows = not tested)     LE MMT:   MMT Right 12/25/2021 Left 12/25/2021  Hip flexion 3+ 4+  Hip extension 3, pain in anterior Rt hip/lower abodmen 4+  Hip abduction 3-, Rt lower quadrant pressure 4+  Hip adduction 3 4+  Hip internal rotation 4- 4-  Hip external rotation 4- 4-  Knee flexion      Knee extension      Ankle dorsiflexion      Ankle plantarflexion      Ankle inversion      Ankle eversion        PELVIC MMT:  will assess in future sessions  PALPATION:   General  tenderness throughout rt abdomen and anterior Rt hip/groin; some tenderness into lateral Rt hip and anterior Rt thigh       TODAY'S TREATMENT 6/7 Manual: Scar tissue mobilization: Abdominal scar tissue  Neuromuscular re-education: Core retraining:  Deep core contraction training with multimodal cues and breath coordination Core facilitation: Supine march 2 x 10 Leg extensions 10x bil  Bridge with hip adduction, 10 x 5 sec   TREATMENT 12/25/21 EVAL  Neuromuscular re-education: Down training: Diaphragmatic breathing Exercises: Stretches/mobility: Lower trunk mobility 3 x 10 Bent knee fall out 10x bil Open books 10x bil Cat cow 10x Seated side bend 10x bil Self-care: Urge suppression technique Double-voiding       PATIENT EDUCATION:  Education details: HEP progressions Person educated: Patient Education method: Explanation, Demonstration, Tactile cues, Verbal cues, and Handouts Education comprehension: verbalized understanding     HOME EXERCISE PROGRAM: [id number]   ASSESSMENT:   CLINICAL IMPRESSION: Patient overall doing very well with decrease in abdominal soreness with return to regular exercise. She does still continue to demonstrate abdominal scar tissue restriction, but improving. She reports some stinging with scar tissue mobilization. She did very well with core training and progression into exercises with  good control and appropriate challenge. We discussed how strengthening/contracting core muscles will further help to mobilize scar tissue. Patient will continue to benefit from skilled PT intervention in order to improve Rt abdominal pain, decrease bladder dysfunction, and initiate/progress functional strengthening program.      OBJECTIVE IMPAIRMENTS decreased activity tolerance, decreased coordination, decreased endurance, decreased mobility, decreased ROM, decreased strength, hypomobility, increased fascial restrictions, increased muscle spasms, impaired flexibility, and pain.    ACTIVITY LIMITATIONS  sit<>stand, walking, bending .    PERSONAL FACTORS 3+ comorbidities: LAR/colostomy reversal, ileostomy 2021, constipation, rectal cancer, cervical cancer, tubal pregnancy with surgical resolution, excision of sebaceous cyst  are also affecting patient's functional outcome.      REHAB POTENTIAL: Good   CLINICAL DECISION MAKING: Stable/uncomplicated   EVALUATION COMPLEXITY: Low     GOALS: Goals reviewed with patient? Yes   SHORT TERM GOALS: Target date: 01/22/2022     Pt will be independent with HEP.    Baseline: Goal status: INITIAL   2.  Pt will be able to perform bed mobility and sit<>stand without any increase in abdominal pain and appropriate pressure management.  Baseline:  Goal status: INITIAL       LONG TERM GOALS: Target date: 03/05/2022     Pt will be independent with advanced HEP.    Baseline:  Goal status: INITIAL   2.  Pt will improve all impaired lumbar A/ROM by 25% in order to improve functional mobility.  Baseline:  Goal status: INITIAL   3.  Pt will report no episodes of SUI with sneezing, laughing, coughing, walking in order to reduce pad usage and improve personal hygiene.  Baseline:  Goal status: INITIAL   4.  Patient will report no episodes of nocturnal enuresis and no greater than 1x/night nocturia in order to get better rest.  Baseline:  Goal  status: INITIAL   5.  Pt will be able to ambulate >20 minutes without increased abdominal pain in order to perform community ambulation.  Baseline:  Goal status: INITIAL   6.  Pt will improve all impaired hip strength by one muscle grade in order to demonstrate improved functional strength and be able to ambulate and perform stairs with less difficulty. Baseline:  Goal status: INITIAL   7.  Pt  will report no greater than 1/10 Rt abdominal pain and improved scar tissue mobility.  Baseline:  Goal status: INITIAL   PLAN: PT FREQUENCY: 1x/week   PT DURATION: 10 weeks   PLANNED INTERVENTIONS: Therapeutic exercises, Therapeutic activity, Neuromuscular re-education, Balance training, Gait training, Patient/Family education, Joint mobilization, Dry Needling, Biofeedback, and Manual therapy   PLAN FOR NEXT SESSION: Plan to perform manual techniques to abdomen to improve mobility; progress mobility and begin gentle strengthening. Consider pelvic floor exam.       Heather Roberts, PT, DPT06/02/2310:45 AM

## 2022-01-15 ENCOUNTER — Encounter: Payer: Self-pay | Admitting: Internal Medicine

## 2022-01-22 ENCOUNTER — Ambulatory Visit: Payer: Medicare Other

## 2022-01-22 DIAGNOSIS — M6281 Muscle weakness (generalized): Secondary | ICD-10-CM | POA: Diagnosis not present

## 2022-01-22 DIAGNOSIS — R293 Abnormal posture: Secondary | ICD-10-CM | POA: Diagnosis not present

## 2022-01-22 DIAGNOSIS — R252 Cramp and spasm: Secondary | ICD-10-CM

## 2022-01-22 DIAGNOSIS — R279 Unspecified lack of coordination: Secondary | ICD-10-CM

## 2022-01-22 NOTE — Therapy (Signed)
OUTPATIENT PHYSICAL THERAPY TREATMENT NOTE   Patient Name: Laura Mcdonald MRN: [id number] DOB:Jun 02, 1956, 66 y.o., female Today's Date: 01/22/2022  PCP: Isaac Bliss, Rayford Halsted, MD REFERRING PROVIDER: Georganna Skeans, MD  END OF SESSION:   PT End of Session - 01/22/22 0800     Visit Number 3    Date for PT Re-Evaluation 03/05/22    Authorization Type [employer]    Progress Note Due on Visit 10    PT Start Time 0800    PT Stop Time 0840    PT Time Calculation (min) 40 min    Activity Tolerance Patient tolerated treatment well    Behavior During Therapy WFL for tasks assessed/performed              Past Medical History:  Diagnosis Date   Allergy    Cancer ([place])    cervical anc rectal   GERD (gastroesophageal reflux disease)    past hx but better wirg weight loss    History of kidney stones    seen on a scan no treatment    Mild intermittent asthma    seasonal or allery induced   Past Surgical History:  Procedure Laterality Date   BIOPSY  01/15/2020   Procedure: BIOPSY;  Surgeon: Irving Copas., MD;  Location: [facility];  Service: Gastroenterology;;   cervical cancer surgery  1995~   Dr Stann Mainland; "WHIP surgery"   COLONOSCOPY WITH PROPOFOL N/A 01/15/2020   Procedure: COLONOSCOPY WITH PROPOFOL;  Surgeon: Irving Copas., MD;  Location: [facility];  Service: Gastroenterology;  Laterality: N/A;   COLOSTOMY REVERSAL N/A 04/26/2020   Procedure: ILEOSTOMY REVERSAL;  Surgeon: Leighton Ruff, MD;  Location: [facility];  Service: General;  Laterality: N/A;   HAND SURGERY Right    SUBMUCOSAL LIFTING INJECTION  01/15/2020   Procedure: SUBMUCOSAL LIFTING INJECTION;  Surgeon: Irving Copas., MD;  Location: [facility];  Service: Gastroenterology;;   tubal preganancy     XI ROBOTIC ASSISTED LOWER ANTERIOR RESECTION N/A 02/15/2020   Procedure: ROBOTIC LOW ANTERIOR RESECTION, DIVERTING ILEOSTOMY;  Surgeon: Leighton Ruff, MD;  Location: [facility];   Service: General;  Laterality: N/A;   Patient Active Problem List   Diagnosis Date Noted   Vitamin D deficiency 05/28/2021   Rectal cancer ([place]) 01/22/2020   Rectal polyp 12/31/2019   Abnormal colonoscopy 12/31/2019   Mild intermittent asthma    Cervical cancer ([place])    KNEE PAIN, RIGHT 03/13/2008   ALLERGIC RHINITIS 04/29/2007   GERD 04/29/2007    REFERRING DIAG: L90.5 (ICD-10-CM) - Scar conditions and fibrosis of skin  THERAPY DIAG:  Cramp and spasm  Unspecified lack of coordination  Muscle weakness (generalized)  Abnormal posture  Rationale for Evaluation and Treatment Rehabilitation  PERTINENT HISTORY: LAR/colostomy reversal, ileostomy 2021, constipation, rectal cancer, cervical cancer, tubal pregnancy with surgical resolution  PRECAUTIONS: NA  SUBJECTIVE: Patient states that she has been practicing breathing and working on exercises. She reports soreness that is about a 3/10. Improved urgency when she uses urge suppression technique.   PAIN:  Are you having pain? Yes: NPRS scale: 3/10 Pain location: abdomen Pain description: soreness Aggravating factors: not moving Relieving factors: stretching   SUBJECTIVE 12/25/21:  SUBJECTIVE STATEMENT: Patient returns for PT after local excision of sebaceous cyst that developed after ileostomy reversal. She states that her husband has been diagnosed with metastatic prostate cancer and life has been very stressful. She states that the stress impacts her abdominal. She is still having pain with pants over scar tissue. She will have sharp pain at ileostomy side when she bends forwards, getting out of a chair, and walking. With walking, she feels weakness in the front of her Rt hip and LE.      Patient confirms identification and approves PT to  assess pelvic floor and treatment Yes     PAIN:  Are you having pain? Yes NPRS scale: 5/10   PRECAUTIONS: None   WEIGHT BEARING RESTRICTIONS No   FALLS:  Has patient fallen in last 6 months? No   LIVING ENVIRONMENT: Lives with: lives with their family Lives in: House/apartment     OCCUPATION: Retired   PLOF: Independent   PATIENT GOALS To decrease pain   PERTINENT HISTORY:  LAR/colostomy reversal, ileostomy 2021, constipation, rectal cancer, cervical cancer, tubal pregnancy with surgical resolution Sexual abuse: No   BOWEL MOVEMENT Pain with bowel movement: No Type of bowel movement:Frequency every time she urinates (painful to hold) and Strain No Fully empty rectum: No Leakage: No Pads: No Fiber supplement: No   URINATION Pain with urination: No Fully empty bladder: No Stream: Strong Urgency: Yes: when she is in the bathroom Frequency: every 2 hours - not sure; nocturnal enuresis with pad and 2x/night nocturia - changes pad each time she gets up to urinate Leakage: Urge to void, Coughing, Sneezing, Laughing, and walking Pads: Yes: 4-5   INTERCOURSE Pain with intercourse:  not since cancer diagnosis of husband Ability to have vaginal penetration:  Yes: - Climax: yes     PREGNANCY Vaginal deliveries 5 Tearing Yes: - C-section deliveries 0 Currently pregnant No       OBJECTIVE 01/22/22: Internal pelvic floor exam performed vaginally demonstrating 1/5 strength initially and 2/5 after contraction training and 4 second endurance hold. See below for updated lumbar A/ROM.    12/25/21: COGNITION:            Overall cognitive status: Within functional limits for tasks assessed                          SENSATION:            Light touch: Appears intact            Proprioception: Appears intact     FUNCTIONAL TESTS:  Sit-stand: pulling in Rt abdomen   GAIT:   Comments: WNL   POSTURE:  Forward head/rounded shoulders, thoracic kyphosis, posterior pelvic  tilt   LUMBARAROM/PROM   A/PROM A/PROM  12/25/2021 A/ROM 01/22/22  Flexion 50% 75%  Extension 50% 75%, mild tightness anteriorly  Right lateral flexion 75% 100%, pulling in abdomen  Left lateral flexion 75% 100% pulling in abdomen  Right rotation 50%, pull in Rt 75%  Left rotation 75% 75%   (Blank rows = not tested)     LE MMT:   MMT Right 12/25/2021 Left 12/25/2021  Hip flexion 3+ 4+  Hip extension 3, pain in anterior Rt hip/lower abodmen 4+  Hip abduction 3-, Rt lower quadrant pressure 4+  Hip adduction 3 4+  Hip internal rotation 4- 4-  Hip external rotation 4- 4-  Knee flexion      Knee extension  Ankle dorsiflexion      Ankle plantarflexion      Ankle inversion      Ankle eversion        PELVIC MMT:  will assess in future sessions           PALPATION:   General  tenderness throughout rt abdomen and anterior Rt hip/groin; some tenderness into lateral Rt hip and anterior Rt thigh       TODAY'S TREATMENT 01/22/22 Manual: Scar tissue mobilization: Abdominal scar tissue mobilization Internal pelvic floor techniques: No emotional/communication barriers or cognitive limitation. Patient is motivated to learn. Patient understands and agrees with treatment goals and plan. PT explains patient will be examined in standing, sitting, and lying down to see how their muscles and joints work. When they are ready, they will be asked to remove their underwear so PT can examine their perineum. The patient is also given the option of providing their own chaperone as one is not provided in our facility. The patient also has the right and is explained the right to defer or refuse any part of the evaluation or treatment including the internal exam. With the patient's consent, PT will use one gloved finger to gently assess the muscles of the pelvic floor, seeing how well it contracts and relaxes and if there is muscle symmetry. After, the patient will get dressed and PT and patient will  discuss exam findings and plan of care. PT and patient discuss plan of care, schedule, attendance policy and HEP activities. Internal vaginal assessment of pelvic floor   Neuromuscular re-education: Pelvic floor contraction training: Quick flicks with multimodal cues for improved strength/motor control with breath coordination, 2 x 10 Long holds 5 x 10 seconds  Self-care: The knack Urge suppression technique review   TREATMENT 6/7 Manual: Scar tissue mobilization: Abdominal scar tissue  Neuromuscular re-education: Core retraining:  Deep core contraction training with multimodal cues and breath coordination Core facilitation: Supine march 2 x 10 Leg extensions 10x bil  Bridge with hip adduction, 10 x 5 sec   TREATMENT 12/25/21 EVAL  Neuromuscular re-education: Down training: Diaphragmatic breathing Exercises: Stretches/mobility: Lower trunk mobility 3 x 10 Bent knee fall out 10x bil Open books 10x bil Cat cow 10x Seated side bend 10x bil Self-care: Urge suppression technique Double-voiding       PATIENT EDUCATION:  Education details: HEP progressions; see above self-care Person educated: Patient Education method: Explanation, Demonstration, Tactile cues, Verbal cues, and Handouts Education comprehension: verbalized understanding     HOME EXERCISE PROGRAM: [id number]   ASSESSMENT:   CLINICAL IMPRESSION: Patient doing well and demonstrates significant improvement in all lumbar A/ROM, but does have some anterior/abdominal pulling with most movements just Rt of midline. Believe being able to increase mobility is allowing her to stretch into scar tissue at ostomy site more. Due to little changes in urinary symptoms, internal vaginal assessment performed with findings of pelvic floor weakness and poor coordination. She did very well wit contraction training and was able to improve strength from 1/5 to 2/5 and achieve 10 second endurance hold with coordinating  re-contraction as needed. She practiced the knack with good improvement in pressure management while coughing. Abdominal scar tissue mobility improving, but still restriction and mildly tender. She reported 0/10 pain at end of treatment session. Patient will continue to benefit from skilled PT intervention in order to improve Rt abdominal pain, decrease bladder dysfunction, and initiate/progress functional strengthening program.      OBJECTIVE IMPAIRMENTS decreased activity tolerance, decreased coordination,  decreased endurance, decreased mobility, decreased ROM, decreased strength, hypomobility, increased fascial restrictions, increased muscle spasms, impaired flexibility, and pain.    ACTIVITY LIMITATIONS  sit<>stand, walking, bending .    PERSONAL FACTORS 3+ comorbidities: LAR/colostomy reversal, ileostomy 2021, constipation, rectal cancer, cervical cancer, tubal pregnancy with surgical resolution, excision of sebaceous cyst  are also affecting patient's functional outcome.      REHAB POTENTIAL: Good   CLINICAL DECISION MAKING: Stable/uncomplicated   EVALUATION COMPLEXITY: Low     GOALS: Goals reviewed with patient? Yes   SHORT TERM GOALS: Target date: 01/22/2022  - updated 01/22/22   Pt will be independent with HEP.    Baseline: Goal status: MET - 01/22/22   2.  Pt will be able to perform bed mobility and sit<>stand without any increase in abdominal pain and appropriate pressure management.  Baseline:  Goal status: MET 01/22/22       LONG TERM GOALS: Target date: 03/05/2022     Pt will be independent with advanced HEP.    Baseline:  Goal status: IN PROGRESS   2.  Pt will improve all impaired lumbar A/ROM by 25% in order to improve functional mobility.  Baseline:  Goal status: MET 01/22/22   3.  Pt will report no episodes of SUI with sneezing, laughing, coughing, walking in order to reduce pad usage and improve personal hygiene.  Baseline: no changes so far - discussed the  knack today Goal status: IN PROGRESS   4.  Patient will report no episodes of nocturnal enuresis and no greater than 1x/night nocturia in order to get better rest.  Baseline:  Goal status: IN PROGRESS   5.  Pt will be able to ambulate >20 minutes without increased abdominal pain in order to perform community ambulation.  Baseline:  Goal status: IN PROGRESS   6.  Pt will improve all impaired hip strength by one muscle grade in order to demonstrate improved functional strength and be able to ambulate and perform stairs with less difficulty. Baseline:  Goal status: IN PROGRESS   7.  Pt will report no greater than 1/10 Rt abdominal pain and improved scar tissue mobility.  Baseline: She is currently at 3/10 discomfort Goal status: IN PROGRESS   PLAN: PT FREQUENCY: 1x/week   PT DURATION: 10 weeks   PLANNED INTERVENTIONS: Therapeutic exercises, Therapeutic activity, Neuromuscular re-education, Balance training, Gait training, Patient/Family education, Joint mobilization, Dry Needling, Biofeedback, and Manual therapy   PLAN FOR NEXT SESSION: Bladder retraining; abdominal mobility exercises; core/pelvic floor strengthening progressions.       Heather Roberts, PT, DPT06/15/238:44 AM

## 2022-01-22 NOTE — Patient Instructions (Signed)
The knack: Use this technique while coughing, laughing, sneezing, or with any activities that causes you to leak urine a little. Right before you perform one of these activities that increase pressure in the abdomen and pushes a little urine out, perform a pelvic floor muscle contraction and hold. If that does not completely stop the leaking, try tightening your thighs together in addition to performing a pelvic floor muscle contraction. Make sure you are not trying to stifle a cough, sneeze, or laugh; allow these activities in full as it will cause less pressure down into the bladder and pelvic floor muscles.  Urge suppression technique: A technique to help you hold urine until it's an appropriate time to go, whether this is making it home or trying to reach a specific voiding time frame according to your schedule. It helps to send signals from the bladder to the brain that say you don't actually have to void urine right now. This most likely only give you several minutes of relief at first, but repeat as needed; the benefit will last longer as you use this technique more and get into better bladder habits. ?  The technique: o Perform 5 quick flicks (Kegels) rapidly, not worrying about fully relaxing in between each (only in this technique). o Then perform several deep belly breaths while focusing on relaxing the pelvic floor. o Go do something else to help distract yourself from the urge to urinate. o Repeat as needed.   [place] [address], [place], [place] [address] Phone # [phone] Fax [phone]

## 2022-01-29 ENCOUNTER — Ambulatory Visit: Payer: Medicare Other

## 2022-02-05 ENCOUNTER — Ambulatory Visit: Payer: Medicare Other

## 2022-02-12 ENCOUNTER — Telehealth: Payer: Self-pay

## 2022-02-12 ENCOUNTER — Ambulatory Visit: Payer: Medicare Other | Attending: General Surgery

## 2022-02-12 DIAGNOSIS — M6281 Muscle weakness (generalized): Secondary | ICD-10-CM | POA: Insufficient documentation

## 2022-02-12 DIAGNOSIS — R279 Unspecified lack of coordination: Secondary | ICD-10-CM | POA: Insufficient documentation

## 2022-02-12 DIAGNOSIS — R252 Cramp and spasm: Secondary | ICD-10-CM | POA: Insufficient documentation

## 2022-02-12 DIAGNOSIS — R293 Abnormal posture: Secondary | ICD-10-CM | POA: Insufficient documentation

## 2022-02-12 NOTE — Telephone Encounter (Signed)
Called and LM with patient after 2nd no-show in a row on 02/12/22 at 8:00. She was asked to call in order to discuss future appointments.

## 2022-02-19 ENCOUNTER — Ambulatory Visit: Payer: Medicare Other

## 2022-02-19 DIAGNOSIS — R279 Unspecified lack of coordination: Secondary | ICD-10-CM | POA: Diagnosis not present

## 2022-02-19 DIAGNOSIS — R293 Abnormal posture: Secondary | ICD-10-CM

## 2022-02-19 DIAGNOSIS — R252 Cramp and spasm: Secondary | ICD-10-CM

## 2022-02-19 DIAGNOSIS — M6281 Muscle weakness (generalized): Secondary | ICD-10-CM | POA: Diagnosis not present

## 2022-02-19 NOTE — Patient Instructions (Signed)
Bladder retraining:  Drink 4-8oz an hour ONLY WATER Stop water intake 3 hours before bed Try to go at least 2-3 hours between trips to the bathroom - go whether you need to or not.  For two weeks.   [facility] [address] [place], [place] [address] Phone # [phone] Fax [phone]

## 2022-02-19 NOTE — Therapy (Signed)
OUTPATIENT PHYSICAL THERAPY TREATMENT NOTE   Patient Name: Laura Mcdonald MRN: [id number] DOB:[birth year], 67 y.o., female Today's Date: 02/19/2022  PCP: Isaac Bliss, Rayford Halsted, MD REFERRING PROVIDER: Georganna Skeans, MD  END OF SESSION:   PT End of Session - 02/19/22 0817     Visit Number 4    Date for PT Re-Evaluation 03/05/22    Authorization Type [employer] Medicare    PT Start Time 0815    PT Stop Time 0842    PT Time Calculation (min) 27 min    Activity Tolerance Patient tolerated treatment well    Behavior During Therapy WFL for tasks assessed/performed               Past Medical History:  Diagnosis Date   Allergy    Cancer ([place])    cervical anc rectal   GERD (gastroesophageal reflux disease)    past hx but better wirg weight loss    History of kidney stones    seen on a scan no treatment    Mild intermittent asthma    seasonal or allery induced   Past Surgical History:  Procedure Laterality Date   BIOPSY  01/15/2020   Procedure: BIOPSY;  Surgeon: Irving Copas., MD;  Location: [facility] ENDOSCOPY;  Service: Gastroenterology;;   cervical cancer surgery  1995~   Dr Stann Mainland; "WHIP surgery"   COLONOSCOPY WITH PROPOFOL N/A 01/15/2020   Procedure: COLONOSCOPY WITH PROPOFOL;  Surgeon: Irving Copas., MD;  Location: [place];  Service: Gastroenterology;  Laterality: N/A;   COLOSTOMY REVERSAL N/A 04/26/2020   Procedure: ILEOSTOMY REVERSAL;  Surgeon: Leighton Ruff, MD;  Location: [facility];  Service: General;  Laterality: N/A;   HAND SURGERY Right    SUBMUCOSAL LIFTING INJECTION  01/15/2020   Procedure: SUBMUCOSAL LIFTING INJECTION;  Surgeon: Irving Copas., MD;  Location: [place];  Service: Gastroenterology;;   tubal preganancy     XI ROBOTIC ASSISTED LOWER ANTERIOR RESECTION N/A 02/15/2020   Procedure: ROBOTIC LOW ANTERIOR RESECTION, DIVERTING ILEOSTOMY;  Surgeon: Leighton Ruff, MD;  Location: [facility];  Service: General;  Laterality: N/A;    Patient Active Problem List   Diagnosis Date Noted   Vitamin D deficiency 05/28/2021   Rectal cancer ([place]) 01/22/2020   Rectal polyp 12/31/2019   Abnormal colonoscopy 12/31/2019   Mild intermittent asthma    Cervical cancer ([place])    KNEE PAIN, RIGHT 03/13/2008   ALLERGIC RHINITIS 04/29/2007   GERD 04/29/2007    REFERRING DIAG: L90.5 (ICD-10-CM) - Scar conditions and fibrosis of skin  THERAPY DIAG:  Cramp and spasm  Unspecified lack of coordination  Muscle weakness (generalized)  Abnormal posture  Rationale for Evaluation and Treatment Rehabilitation  PERTINENT HISTORY: LAR/colostomy reversal, ileostomy 2021, constipation, rectal cancer, cervical cancer, tubal pregnancy with surgical resolution  PRECAUTIONS: NA  SUBJECTIVE: Patient states that she is having almost no pain, just mild tenderness around incision sites. Pt states that she has had less urinary urgency and leaking. When she goes to the bathroom with relaxation breathing, she can sit down without peeing first. She is still having stress incontinence with laughing/coughing.   PAIN:  Are you having pain? Yes: NPRS scale: 3/10 Pain location: abdomen Pain description: soreness Aggravating factors: not moving Relieving factors: stretching   SUBJECTIVE 12/25/21:  SUBJECTIVE STATEMENT: Patient returns for PT after local excision of sebaceous cyst that developed after ileostomy reversal. She states that her husband has been diagnosed with metastatic prostate cancer and life has been very stressful. She states that the stress impacts her abdominal. She is still having pain with pants over scar tissue. She will have sharp pain at ileostomy side when she bends forwards, getting out of a chair, and walking. With walking, she feels weakness  in the front of her Rt hip and LE.      Patient confirms identification and approves PT to assess pelvic floor and treatment Yes     PAIN:  Are you having pain? Yes NPRS scale: 5/10   PRECAUTIONS: None   WEIGHT BEARING RESTRICTIONS No   FALLS:  Has patient fallen in last 6 months? No   LIVING ENVIRONMENT: Lives with: lives with their family Lives in: House/apartment     OCCUPATION: Retired   PLOF: Independent   PATIENT GOALS To decrease pain   PERTINENT HISTORY:  LAR/colostomy reversal, ileostomy 2021, constipation, rectal cancer, cervical cancer, tubal pregnancy with surgical resolution Sexual abuse: No   BOWEL MOVEMENT Pain with bowel movement: No Type of bowel movement:Frequency every time she urinates (painful to hold) and Strain No Fully empty rectum: No Leakage: No Pads: No Fiber supplement: No   URINATION Pain with urination: No Fully empty bladder: No Stream: Strong Urgency: Yes: when she is in the bathroom Frequency: every 2 hours - not sure; nocturnal enuresis with pad and 2x/night nocturia - changes pad each time she gets up to urinate Leakage: Urge to void, Coughing, Sneezing, Laughing, and walking Pads: Yes: 4-5   INTERCOURSE Pain with intercourse:  not since cancer diagnosis of husband Ability to have vaginal penetration:  Yes: - Climax: yes     PREGNANCY Vaginal deliveries 5 Tearing Yes: - C-section deliveries 0 Currently pregnant No       OBJECTIVE 01/22/22: Internal pelvic floor exam performed vaginally demonstrating 1/5 strength initially and 2/5 after contraction training and 4 second endurance hold. See below for updated lumbar A/ROM.    12/25/21: COGNITION:            Overall cognitive status: Within functional limits for tasks assessed                          SENSATION:            Light touch: Appears intact            Proprioception: Appears intact     FUNCTIONAL TESTS:  Sit-stand: pulling in Rt abdomen   GAIT:    Comments: WNL   POSTURE:  Forward head/rounded shoulders, thoracic kyphosis, posterior pelvic tilt   LUMBARAROM/PROM   A/PROM A/PROM  12/25/2021 A/ROM 01/22/22  Flexion 50% 75%  Extension 50% 75%, mild tightness anteriorly  Right lateral flexion 75% 100%, pulling in abdomen  Left lateral flexion 75% 100% pulling in abdomen  Right rotation 50%, pull in Rt 75%  Left rotation 75% 75%   (Blank rows = not tested)     LE MMT:   MMT Right 12/25/2021 Left 12/25/2021  Hip flexion 3+ 4+  Hip extension 3, pain in anterior Rt hip/lower abodmen 4+  Hip abduction 3-, Rt lower quadrant pressure 4+  Hip adduction 3 4+  Hip internal rotation 4- 4-  Hip external rotation 4- 4-  Knee flexion      Knee extension  Ankle dorsiflexion      Ankle plantarflexion      Ankle inversion      Ankle eversion        PELVIC MMT:  will assess in future sessions           PALPATION:   General  tenderness throughout rt abdomen and anterior Rt hip/groin; some tenderness into lateral Rt hip and anterior Rt thigh       TODAY'S TREATMENT 02/19/22 Neuromuscular re-education: Core facilitation: Seated horizontal abduction with core facilitation, green band, 3 x 10 D2 flexion/extension seated, red band, 2 x 10 bil, focus on core control Anterior weight hold with marching 3 x 10, 2lb weight Pallof press red band, 2 x 10 bil Seated clam with focus on postural control/core stability, 2 x 10, blue band Self-care: Strict bladder retraining   TREATMENT 01/22/22 Manual: Scar tissue mobilization: Abdominal scar tissue mobilization Internal pelvic floor techniques: No emotional/communication barriers or cognitive limitation. Patient is motivated to learn. Patient understands and agrees with treatment goals and plan. PT explains patient will be examined in standing, sitting, and lying down to see how their muscles and joints work. When they are ready, they will be asked to remove their underwear so PT can  examine their perineum. The patient is also given the option of providing their own chaperone as one is not provided in our facility. The patient also has the right and is explained the right to defer or refuse any part of the evaluation or treatment including the internal exam. With the patient's consent, PT will use one gloved finger to gently assess the muscles of the pelvic floor, seeing how well it contracts and relaxes and if there is muscle symmetry. After, the patient will get dressed and PT and patient will discuss exam findings and plan of care. PT and patient discuss plan of care, schedule, attendance policy and HEP activities. Internal vaginal assessment of pelvic floor   Neuromuscular re-education: Pelvic floor contraction training: Quick flicks with multimodal cues for improved strength/motor control with breath coordination, 2 x 10 Long holds 5 x 10 seconds  Self-care: The knack Urge suppression technique review   TREATMENT 6/7 Manual: Scar tissue mobilization: Abdominal scar tissue  Neuromuscular re-education: Core retraining:  Deep core contraction training with multimodal cues and breath coordination Core facilitation: Supine march 2 x 10 Leg extensions 10x bil  Bridge with hip adduction, 10 x 5 sec       PATIENT EDUCATION:  Education details: HEP progressions; see above self-care Person educated: Patient Education method: Explanation, Demonstration, Tactile cues, Verbal cues, and Handouts Education comprehension: verbalized understanding     HOME EXERCISE PROGRAM: [id number]   ASSESSMENT:   CLINICAL IMPRESSION: Pt overall doing very well with very low pain levels overall and improvements in bladder function. We discussed strict bladder retraining today to further help with urgency and leaking; pt agreeable to try for two weeks. Core training progressed with excellent tolerance and good form; some verbal cues required for breath coordination and improved core  activation. She reported being able to feel co-contraction of pelvic floor with core. Patient will continue to benefit from skilled PT intervention in order to improve Rt abdominal pain, decrease bladder dysfunction, and initiate/progress functional strengthening program.      OBJECTIVE IMPAIRMENTS decreased activity tolerance, decreased coordination, decreased endurance, decreased mobility, decreased ROM, decreased strength, hypomobility, increased fascial restrictions, increased muscle spasms, impaired flexibility, and pain.    ACTIVITY LIMITATIONS  sit<>stand, walking, bending .  PERSONAL FACTORS 3+ comorbidities: LAR/colostomy reversal, ileostomy 2021, constipation, rectal cancer, cervical cancer, tubal pregnancy with surgical resolution, excision of sebaceous cyst  are also affecting patient's functional outcome.      REHAB POTENTIAL: Good   CLINICAL DECISION MAKING: Stable/uncomplicated   EVALUATION COMPLEXITY: Low     GOALS: Goals reviewed with patient? Yes   SHORT TERM GOALS: Target date: 01/22/2022  - updated 01/22/22   Pt will be independent with HEP.    Baseline: Goal status: MET - 01/22/22   2.  Pt will be able to perform bed mobility and sit<>stand without any increase in abdominal pain and appropriate pressure management.  Baseline:  Goal status: MET 01/22/22       LONG TERM GOALS: Target date: 03/05/2022  - updated 02/19/22   Pt will be independent with advanced HEP.    Baseline:  Goal status: IN PROGRESS   2.  Pt will improve all impaired lumbar A/ROM by 25% in order to improve functional mobility.  Baseline:  Goal status: MET 01/22/22   3.  Pt will report no episodes of SUI with sneezing, laughing, coughing, walking in order to reduce pad usage and improve personal hygiene.  Baseline: no changes so far - discussed the knack today Goal status: IN PROGRESS   4.  Patient will report no episodes of nocturnal enuresis and no greater than 1x/night nocturia in  order to get better rest.  Baseline:  Goal status: IN PROGRESS   5.  Pt will be able to ambulate >20 minutes without increased abdominal pain in order to perform community ambulation.  Baseline:  Goal status: IN PROGRESS   6.  Pt will improve all impaired hip strength by one muscle grade in order to demonstrate improved functional strength and be able to ambulate and perform stairs with less difficulty. Baseline:  Goal status: IN PROGRESS   7.  Pt will report no greater than 1/10 Rt abdominal pain and improved scar tissue mobility.  Baseline: She is currently at 3/10 discomfort Goal status: IN PROGRESS   PLAN: PT FREQUENCY: 1x/week   PT DURATION: 10 weeks   PLANNED INTERVENTIONS: Therapeutic exercises, Therapeutic activity, Neuromuscular re-education, Balance training, Gait training, Patient/Family education, Joint mobilization, Dry Needling, Biofeedback, and Manual therapy   PLAN FOR NEXT SESSION: Bladder retraining; abdominal mobility exercises; core/pelvic floor strengthening progressions.       Heather Roberts, PT, DPT07/13/233:42 PM

## 2022-02-26 ENCOUNTER — Ambulatory Visit: Payer: Medicare Other

## 2022-02-26 DIAGNOSIS — R279 Unspecified lack of coordination: Secondary | ICD-10-CM | POA: Diagnosis not present

## 2022-02-26 DIAGNOSIS — R293 Abnormal posture: Secondary | ICD-10-CM | POA: Diagnosis not present

## 2022-02-26 DIAGNOSIS — M6281 Muscle weakness (generalized): Secondary | ICD-10-CM | POA: Diagnosis not present

## 2022-02-26 DIAGNOSIS — R252 Cramp and spasm: Secondary | ICD-10-CM

## 2022-02-26 NOTE — Therapy (Signed)
OUTPATIENT PHYSICAL THERAPY TREATMENT NOTE   Patient Name: Laura Mcdonald MRN: [id number] DOB:[birth year], 66 y.o., female Today's Date: 02/26/2022  PCP: Isaac Bliss, Rayford Halsted, MD REFERRING PROVIDER: Georganna Skeans, MD  END OF SESSION:   PT End of Session - 02/26/22 0805     Visit Number 5    Date for PT Re-Evaluation 03/05/22    Authorization Type [employer]    Progress Note Due on Visit 10    PT Start Time 0803    PT Stop Time 0841    PT Time Calculation (min) 38 min    Activity Tolerance Patient tolerated treatment well    Behavior During Therapy WFL for tasks assessed/performed                Past Medical History:  Diagnosis Date   Allergy    Cancer ([place])    cervical anc rectal   GERD (gastroesophageal reflux disease)    past hx but better wirg weight loss    History of kidney stones    seen on a scan no treatment    Mild intermittent asthma    seasonal or allery induced   Past Surgical History:  Procedure Laterality Date   BIOPSY  01/15/2020   Procedure: BIOPSY;  Surgeon: Irving Copas., MD;  Location: [facility];  Service: Gastroenterology;;   cervical cancer surgery  1995~   Dr Stann Mainland; "WHIP surgery"   COLONOSCOPY WITH PROPOFOL N/A 01/15/2020   Procedure: COLONOSCOPY WITH PROPOFOL;  Surgeon: Irving Copas., MD;  Location: [facility];  Service: Gastroenterology;  Laterality: N/A;   COLOSTOMY REVERSAL N/A 04/26/2020   Procedure: ILEOSTOMY REVERSAL;  Surgeon: Leighton Ruff, MD;  Location: [facility];  Service: General;  Laterality: N/A;   HAND SURGERY Right    SUBMUCOSAL LIFTING INJECTION  01/15/2020   Procedure: SUBMUCOSAL LIFTING INJECTION;  Surgeon: Irving Copas., MD;  Location: [place];  Service: Gastroenterology;;   tubal preganancy     XI ROBOTIC ASSISTED LOWER ANTERIOR RESECTION N/A 02/15/2020   Procedure: ROBOTIC LOW ANTERIOR RESECTION, DIVERTING ILEOSTOMY;  Surgeon: Leighton Ruff, MD;  Location: [facility];   Service: General;  Laterality: N/A;   Patient Active Problem List   Diagnosis Date Noted   Vitamin D deficiency 05/28/2021   Rectal cancer ([place]) 01/22/2020   Rectal polyp 12/31/2019   Abnormal colonoscopy 12/31/2019   Mild intermittent asthma    Cervical cancer ([place])    KNEE PAIN, RIGHT 03/13/2008   ALLERGIC RHINITIS 04/29/2007   GERD 04/29/2007    REFERRING DIAG: L90.5 (ICD-10-CM) - Scar conditions and fibrosis of skin  THERAPY DIAG:  Cramp and spasm  Unspecified lack of coordination  Muscle weakness (generalized)  Rationale for Evaluation and Treatment Rehabilitation  PERTINENT HISTORY: LAR/colostomy reversal, ileostomy 2021, constipation, rectal cancer, cervical cancer, tubal pregnancy with surgical resolution  PRECAUTIONS: NA  SUBJECTIVE: Pt states that she is overall feeling great. She noticed two times during exercises (deadbug and pallof) she would feel a bee-sting like sensation in Rt side of abdomen. She currently reports 0/10 pain.   PAIN:  Are you having pain? Yes: NPRS scale: 0/10 Pain location: abdomen Pain description: soreness Aggravating factors: not moving Relieving factors: stretching   SUBJECTIVE 12/25/21:  SUBJECTIVE STATEMENT: Patient returns for PT after local excision of sebaceous cyst that developed after ileostomy reversal. She states that her husband has been diagnosed with metastatic prostate cancer and life has been very stressful. She states that the stress impacts her abdominal. She is still having pain with pants over scar tissue. She will have sharp pain at ileostomy side when she bends forwards, getting out of a chair, and walking. With walking, she feels weakness in the front of her Rt hip and LE.      Patient confirms identification and approves PT to  assess pelvic floor and treatment Yes     PAIN:  Are you having pain? Yes NPRS scale: 5/10   PRECAUTIONS: None   WEIGHT BEARING RESTRICTIONS No   FALLS:  Has patient fallen in last 6 months? No   LIVING ENVIRONMENT: Lives with: lives with their family Lives in: House/apartment     OCCUPATION: Retired   PLOF: Independent   PATIENT GOALS To decrease pain   PERTINENT HISTORY:  LAR/colostomy reversal, ileostomy 2021, constipation, rectal cancer, cervical cancer, tubal pregnancy with surgical resolution Sexual abuse: No   BOWEL MOVEMENT Pain with bowel movement: No Type of bowel movement:Frequency every time she urinates (painful to hold) and Strain No Fully empty rectum: No Leakage: No Pads: No Fiber supplement: No   URINATION Pain with urination: No Fully empty bladder: No Stream: Strong Urgency: Yes: when she is in the bathroom Frequency: every 2 hours - not sure; nocturnal enuresis with pad and 2x/night nocturia - changes pad each time she gets up to urinate Leakage: Urge to void, Coughing, Sneezing, Laughing, and walking Pads: Yes: 4-5   INTERCOURSE Pain with intercourse:  not since cancer diagnosis of husband Ability to have vaginal penetration:  Yes: - Climax: yes     PREGNANCY Vaginal deliveries 5 Tearing Yes: - C-section deliveries 0 Currently pregnant No       OBJECTIVE 01/22/22: Internal pelvic floor exam performed vaginally demonstrating 1/5 strength initially and 2/5 after contraction training and 4 second endurance hold. See below for updated lumbar A/ROM.    12/25/21: COGNITION:            Overall cognitive status: Within functional limits for tasks assessed                          SENSATION:            Light touch: Appears intact            Proprioception: Appears intact     FUNCTIONAL TESTS:  Sit-stand: pulling in Rt abdomen   GAIT:   Comments: WNL   POSTURE:  Forward head/rounded shoulders, thoracic kyphosis, posterior pelvic  tilt   LUMBARAROM/PROM   A/PROM A/PROM  12/25/2021 A/ROM 01/22/22  Flexion 50% 75%  Extension 50% 75%, mild tightness anteriorly  Right lateral flexion 75% 100%, pulling in abdomen  Left lateral flexion 75% 100% pulling in abdomen  Right rotation 50%, pull in Rt 75%  Left rotation 75% 75%   (Blank rows = not tested)     LE MMT:   MMT Right 12/25/2021 Left 12/25/2021  Hip flexion 3+ 4+  Hip extension 3, pain in anterior Rt hip/lower abodmen 4+  Hip abduction 3-, Rt lower quadrant pressure 4+  Hip adduction 3 4+  Hip internal rotation 4- 4-  Hip external rotation 4- 4-  Knee flexion      Knee extension  Ankle dorsiflexion      Ankle plantarflexion      Ankle inversion      Ankle eversion        PELVIC MMT:  will assess in future sessions           PALPATION:   General  tenderness throughout rt abdomen and anterior Rt hip/groin; some tenderness into lateral Rt hip and anterior Rt thigh       TODAY'S TREATMENT 02/26/22 Manual: Soft tissue mobilization: Abdomen, Rt upper/lower quadrant Scar tissue mobilization: Abdominal scar tissue Exercises: Stretches/mobility: Open books 10x bil Seated side bending 10x bil Seated open books 10x bil Bent knee fall out 10x   TREATMENT 02/19/22 Neuromuscular re-education: Core facilitation: Seated horizontal abduction with core facilitation, green band, 3 x 10 D2 flexion/extension seated, red band, 2 x 10 bil, focus on core control Anterior weight hold with marching 3 x 10, 2lb weight Pallof press red band, 2 x 10 bil Seated clam with focus on postural control/core stability, 2 x 10, blue band Self-care: Strict bladder retraining   TREATMENT 01/22/22 Manual: Scar tissue mobilization: Abdominal scar tissue mobilization Internal pelvic floor techniques: No emotional/communication barriers or cognitive limitation. Patient is motivated to learn. Patient understands and agrees with treatment goals and plan. PT explains  patient will be examined in standing, sitting, and lying down to see how their muscles and joints work. When they are ready, they will be asked to remove their underwear so PT can examine their perineum. The patient is also given the option of providing their own chaperone as one is not provided in our facility. The patient also has the right and is explained the right to defer or refuse any part of the evaluation or treatment including the internal exam. With the patient's consent, PT will use one gloved finger to gently assess the muscles of the pelvic floor, seeing how well it contracts and relaxes and if there is muscle symmetry. After, the patient will get dressed and PT and patient will discuss exam findings and plan of care. PT and patient discuss plan of care, schedule, attendance policy and HEP activities. Internal vaginal assessment of pelvic floor   Neuromuscular re-education: Pelvic floor contraction training: Quick flicks with multimodal cues for improved strength/motor control with breath coordination, 2 x 10 Long holds 5 x 10 seconds  Self-care: The knack Urge suppression technique review        PATIENT EDUCATION:  Education details: HEP progressions; see above self-care Person educated: Patient Education method: Explanation, Demonstration, Tactile cues, Verbal cues, and Handouts Education comprehension: verbalized understanding     HOME EXERCISE PROGRAM: [id number]   ASSESSMENT:   CLINICAL IMPRESSION: Pt continues to make good progress, but will feel some discomfort occasionally with more difficult abdominal exercises. Due to this, manual techniques performed to Rt abdomen and abdominal scar tissue; she demonstrates significant scar tissue restriction between new scar tissue and where ostomy site was. She reported initial burning with manual techniques, but that it went away and felt less restricted throughout treatment. She did well with focus on mobility exercises; she was  encouraged to continue these exercises as well as strengthening. Patient will continue to benefit from skilled PT intervention in order to improve Rt abdominal pain, decrease bladder dysfunction, and initiate/progress functional strengthening program.      OBJECTIVE IMPAIRMENTS decreased activity tolerance, decreased coordination, decreased endurance, decreased mobility, decreased ROM, decreased strength, hypomobility, increased fascial restrictions, increased muscle spasms, impaired flexibility, and pain.    ACTIVITY  LIMITATIONS  sit<>stand, walking, bending .    PERSONAL FACTORS 3+ comorbidities: LAR/colostomy reversal, ileostomy 2021, constipation, rectal cancer, cervical cancer, tubal pregnancy with surgical resolution, excision of sebaceous cyst  are also affecting patient's functional outcome.      REHAB POTENTIAL: Good   CLINICAL DECISION MAKING: Stable/uncomplicated   EVALUATION COMPLEXITY: Low     GOALS: Goals reviewed with patient? Yes   SHORT TERM GOALS: Target date: 01/22/2022  - updated 01/22/22   Pt will be independent with HEP.    Baseline: Goal status: MET - 01/22/22   2.  Pt will be able to perform bed mobility and sit<>stand without any increase in abdominal pain and appropriate pressure management.  Baseline:  Goal status: MET 01/22/22       LONG TERM GOALS: Target date: 03/05/2022  - updated 02/19/22   Pt will be independent with advanced HEP.    Baseline:  Goal status: IN PROGRESS   2.  Pt will improve all impaired lumbar A/ROM by 25% in order to improve functional mobility.  Baseline:  Goal status: MET 01/22/22   3.  Pt will report no episodes of SUI with sneezing, laughing, coughing, walking in order to reduce pad usage and improve personal hygiene.  Baseline: no changes so far - discussed the knack today Goal status: IN PROGRESS   4.  Patient will report no episodes of nocturnal enuresis and no greater than 1x/night nocturia in order to get better  rest.  Baseline:  Goal status: IN PROGRESS   5.  Pt will be able to ambulate >20 minutes without increased abdominal pain in order to perform community ambulation.  Baseline:  Goal status: IN PROGRESS   6.  Pt will improve all impaired hip strength by one muscle grade in order to demonstrate improved functional strength and be able to ambulate and perform stairs with less difficulty. Baseline:  Goal status: IN PROGRESS   7.  Pt will report no greater than 1/10 Rt abdominal pain and improved scar tissue mobility.  Baseline: She is currently at 3/10 discomfort Goal status: IN PROGRESS   PLAN: PT FREQUENCY: 1x/week   PT DURATION: 10 weeks   PLANNED INTERVENTIONS: Therapeutic exercises, Therapeutic activity, Neuromuscular re-education, Balance training, Gait training, Patient/Family education, Joint mobilization, Dry Needling, Biofeedback, and Manual therapy   PLAN FOR NEXT SESSION: Bladder retraining check; abdominal mobility exercises; core/pelvic floor strengthening progressions.       Heather Roberts, PT, DPT07/20/238:40 AM

## 2022-02-28 ENCOUNTER — Other Ambulatory Visit: Payer: Self-pay | Admitting: Internal Medicine

## 2022-02-28 DIAGNOSIS — E559 Vitamin D deficiency, unspecified: Secondary | ICD-10-CM

## 2022-02-28 DIAGNOSIS — J4520 Mild intermittent asthma, uncomplicated: Secondary | ICD-10-CM

## 2022-03-05 ENCOUNTER — Ambulatory Visit: Payer: Medicare Other

## 2022-03-05 DIAGNOSIS — R293 Abnormal posture: Secondary | ICD-10-CM

## 2022-03-05 DIAGNOSIS — R279 Unspecified lack of coordination: Secondary | ICD-10-CM | POA: Diagnosis not present

## 2022-03-05 DIAGNOSIS — M6281 Muscle weakness (generalized): Secondary | ICD-10-CM | POA: Diagnosis not present

## 2022-03-05 DIAGNOSIS — R252 Cramp and spasm: Secondary | ICD-10-CM | POA: Diagnosis not present

## 2022-03-05 NOTE — Therapy (Signed)
OUTPATIENT PHYSICAL THERAPY TREATMENT NOTE   Patient Name: Laura Mcdonald MRN: [id number] DOB:06-02-1956, 66 y.o., female Today's Date: 03/05/2022  Progress Note Reporting Period 12/25/21 to 03/05/22  See note below for Objective Data and Assessment of Progress/Goals.      PCP: Isaac Bliss, Rayford Halsted, MD REFERRING PROVIDER: Georganna Skeans, MD  END OF SESSION:   PT End of Session - 03/05/22 0752     Visit Number 6    Date for PT Re-Evaluation 04/30/22    Authorization Type [employer]    Progress Note Due on Visit 10    PT Start Time 0800    PT Stop Time 0840    PT Time Calculation (min) 40 min    Activity Tolerance Patient tolerated treatment well    Behavior During Therapy WFL for tasks assessed/performed                Past Medical History:  Diagnosis Date   Allergy    Cancer ([place])    cervical anc rectal   GERD (gastroesophageal reflux disease)    past hx but better wirg weight loss    History of kidney stones    seen on a scan no treatment    Mild intermittent asthma    seasonal or allery induced   Past Surgical History:  Procedure Laterality Date   BIOPSY  01/15/2020   Procedure: BIOPSY;  Surgeon: Irving Copas., MD;  Location: [facility] ENDOSCOPY;  Service: Gastroenterology;;   cervical cancer surgery  1995~   Dr Stann Mainland; "WHIP surgery"   COLONOSCOPY WITH PROPOFOL N/A 01/15/2020   Procedure: COLONOSCOPY WITH PROPOFOL;  Surgeon: Irving Copas., MD;  Location: [place];  Service: Gastroenterology;  Laterality: N/A;   COLOSTOMY REVERSAL N/A 04/26/2020   Procedure: ILEOSTOMY REVERSAL;  Surgeon: Leighton Ruff, MD;  Location: [facility];  Service: General;  Laterality: N/A;   HAND SURGERY Right    SUBMUCOSAL LIFTING INJECTION  01/15/2020   Procedure: SUBMUCOSAL LIFTING INJECTION;  Surgeon: Irving Copas., MD;  Location: [place];  Service: Gastroenterology;;   tubal preganancy     XI ROBOTIC ASSISTED LOWER ANTERIOR RESECTION  N/A 02/15/2020   Procedure: ROBOTIC LOW ANTERIOR RESECTION, DIVERTING ILEOSTOMY;  Surgeon: Leighton Ruff, MD;  Location: [facility];  Service: General;  Laterality: N/A;   Patient Active Problem List   Diagnosis Date Noted   Vitamin D deficiency 05/28/2021   Rectal cancer ([place]) 01/22/2020   Rectal polyp 12/31/2019   Abnormal colonoscopy 12/31/2019   Mild intermittent asthma    Cervical cancer ([place])    KNEE PAIN, RIGHT 03/13/2008   ALLERGIC RHINITIS 04/29/2007   GERD 04/29/2007    REFERRING DIAG: L90.5 (ICD-10-CM) - Scar conditions and fibrosis of skin  THERAPY DIAG:  Cramp and spasm - Plan: PT plan of care cert/re-cert  Unspecified lack of coordination - Plan: PT plan of care cert/re-cert  Muscle weakness (generalized) - Plan: PT plan of care cert/re-cert  Abnormal posture - Plan: PT plan of care cert/re-cert  Rationale for Evaluation and Treatment Rehabilitation  PERTINENT HISTORY: LAR/colostomy reversal, ileostomy 2021, constipation, rectal cancer, cervical cancer, tubal pregnancy with surgical resolution  PRECAUTIONS: NA  SUBJECTIVE: Pt states that she has been exercising and she is feeling more flexible. She has been doing a lot to help husband move his office. She had one episode of pain when she was bending over over and then returned to standing; she states there was just a grab in her Rt side. Cutting out caffeine  has helped with urinary incontinence, but now she is having more difficulty with bowel movements. She is only waking 1x/night and having less SUI. She states that urge suppression technique is very helpful.   PAIN:  Are you having pain? Yes: NPRS scale: 0/10 Pain location: abdomen Pain description: soreness Aggravating factors: not moving Relieving factors: stretching   SUBJECTIVE 12/25/21:                                                                                                                                                                                             SUBJECTIVE STATEMENT: Patient returns for PT after local excision of sebaceous cyst that developed after ileostomy reversal. She states that her husband has been diagnosed with metastatic prostate cancer and life has been very stressful. She states that the stress impacts her abdominal. She is still having pain with pants over scar tissue. She will have sharp pain at ileostomy side when she bends forwards, getting out of a chair, and walking. With walking, she feels weakness in the front of her Rt hip and LE.      Patient confirms identification and approves PT to assess pelvic floor and treatment Yes     PAIN:  Are you having pain? Yes NPRS scale: 5/10   PRECAUTIONS: None   WEIGHT BEARING RESTRICTIONS No   FALLS:  Has patient fallen in last 6 months? No   LIVING ENVIRONMENT: Lives with: lives with their family Lives in: House/apartment     OCCUPATION: Retired   PLOF: Independent   PATIENT GOALS To decrease pain   PERTINENT HISTORY:  LAR/colostomy reversal, ileostomy 2021, constipation, rectal cancer, cervical cancer, tubal pregnancy with surgical resolution Sexual abuse: No   BOWEL MOVEMENT Pain with bowel movement: No Type of bowel movement:Frequency every time she urinates (painful to hold) and Strain No Fully empty rectum: No Leakage: No Pads: No Fiber supplement: No   URINATION Pain with urination: No Fully empty bladder: No Stream: Strong Urgency: Yes: when she is in the bathroom Frequency: every 2 hours - not sure; nocturnal enuresis with pad and 2x/night nocturia - changes pad each time she gets up to urinate Leakage: Urge to void, Coughing, Sneezing, Laughing, and walking Pads: Yes: 4-5   INTERCOURSE Pain with intercourse:  not since cancer diagnosis of husband Ability to have vaginal penetration:  Yes: - Climax: yes     PREGNANCY Vaginal deliveries 5 Tearing Yes: - C-section deliveries 0 Currently pregnant No       OBJECTIVE  03/05/22: see charts for updated lumbar A/ROM and hip strength values; decreased Rt hip extension A/ROM and flexibility tested  in prone compared to Rt; scar tissue restriction and mild tenderness still palpable in Rt upper/lower quadrant  01/22/22: Internal pelvic floor exam performed vaginally demonstrating 1/5 strength initially and 2/5 after contraction training and 4 second endurance hold. See below for updated lumbar A/ROM.    12/25/21: COGNITION:            Overall cognitive status: Within functional limits for tasks assessed                          SENSATION:            Light touch: Appears intact            Proprioception: Appears intact     FUNCTIONAL TESTS:  Sit-stand: pulling in Rt abdomen   GAIT:   Comments: WNL   POSTURE:  Forward head/rounded shoulders, thoracic kyphosis, posterior pelvic tilt   LUMBARAROM/PROM   A/PROM A/PROM  12/25/2021 A/ROM 01/22/22 A/ROM 03/05/22  Flexion 50% 75% 90%, little pull in Bil groin  Extension 50% 75%, mild tightness anteriorly 75%, mild pull anterior Rt hip  Right lateral flexion 75% 100%, pulling in abdomen 100%  Left lateral flexion 75% 100% pulling in abdomen 100% mild pull in Rt abdomen  Right rotation 50%, pull in Rt 75% 75%  Left rotation 75% 75% 75%   (Blank rows = not tested)     LE MMT:   MMT Right 12/25/2021 Left 12/25/2021 Right 03/05/22 Left 03/05/22  Hip flexion 3+ 4+ 4/5 5/5  Hip extension 3, pain in anterior Rt hip/lower abodmen 4+ 3/5 4+/5  Hip abduction 3-, Rt lower quadrant pressure 4+ 3+/5, no pain 4+/5  Hip adduction 3 4+ 4-/5 4+/5  Hip internal rotation 4- 4- 4+/5 4+/5  Hip external rotation 4- 4- 4+/5 4+/5    PELVIC MMT:  will assess in future sessions           PALPATION:   General  tenderness throughout rt abdomen and anterior Rt hip/groin; some tenderness into lateral Rt hip and anterior Rt thigh       TODAY'S TREATMENT 03/04/22 RE-EVAL Exercises: Stretches/mobility: Marcello Moores stretch 2 min on  Rt Strengthening: Straight leg raise 10x Rt Regular bridges focusing on height, glute squeeze, and anterior stretch, 2 x 10 Sidelying leg lift 10x Rt Self-care: Fiber intake to help with constipation   TREATMENT 02/26/22 Manual: Soft tissue mobilization: Abdomen, Rt upper/lower quadrant Scar tissue mobilization: Abdominal scar tissue Exercises: Stretches/mobility: Open books 10x bil Seated side bending 10x bil Seated open books 10x bil Bent knee fall out 10x   TREATMENT 02/19/22 Neuromuscular re-education: Core facilitation: Seated horizontal abduction with core facilitation, green band, 3 x 10 D2 flexion/extension seated, red band, 2 x 10 bil, focus on core control Anterior weight hold with marching 3 x 10, 2lb weight Pallof press red band, 2 x 10 bil Seated clam with focus on postural control/core stability, 2 x 10, blue band Self-care: Strict bladder retraining        PATIENT EDUCATION:  Education details: HEP progressions; see above self-care Person educated: Patient Education method: Explanation, Demonstration, Tactile cues, Verbal cues, and Handouts Education comprehension: verbalized understanding     HOME EXERCISE PROGRAM: [id number]   ASSESSMENT:   CLINICAL IMPRESSION: Pt has made good progress since returning to pelvic floor PT demonstrated by increases in lumbar A/ROM, improved walking ability, increased Bil hip strength, decreased abdominal tenderness, decreased nocturia, and improving urinary incontinence. However, she is still having leaking  with significant sneezes, some pain with terminal hip extension (which is limited) in ambulation, weakness most notably in hip extension/abduction on Rt, and discomfort with return to standing position from bending. Believe weakness in hip extension/abduction is limiting terminal hip extension, promoting difficulty with full ROM, flexibility, and mobilizing scar tissue. Due to this, we performed some targeted  strengthening and mobility of these muscle groups with difficulty, but good tolerance. Patient will continue to benefit from skilled PT intervention in order to improve Rt abdominal pain, decrease bladder dysfunction, and initiate/progress functional strengthening program.      OBJECTIVE IMPAIRMENTS decreased activity tolerance, decreased coordination, decreased endurance, decreased mobility, decreased ROM, decreased strength, hypomobility, increased fascial restrictions, increased muscle spasms, impaired flexibility, and pain.    ACTIVITY LIMITATIONS  sit<>stand, walking, bending .    PERSONAL FACTORS 3+ comorbidities: LAR/colostomy reversal, ileostomy 2021, constipation, rectal cancer, cervical cancer, tubal pregnancy with surgical resolution, excision of sebaceous cyst  are also affecting patient's functional outcome.      REHAB POTENTIAL: Good   CLINICAL DECISION MAKING: Stable/uncomplicated   EVALUATION COMPLEXITY: Low     GOALS: Goals reviewed with patient? Yes   SHORT TERM GOALS: Target date: 01/22/2022  - updated 01/22/22   Pt will be independent with HEP.    Baseline: Goal status: MET - 01/22/22   2.  Pt will be able to perform bed mobility and sit<>stand without any increase in abdominal pain and appropriate pressure management.  Baseline:  Goal status: MET 01/22/22       LONG TERM GOALS: Target date: 03/05/2022  - updated 03/05/22   Pt will be independent with advanced HEP.    Baseline:  Goal status: IN PROGRESS   2.  Pt will improve all impaired lumbar A/ROM by 25% in order to improve functional mobility.  Baseline:  Goal status: MET 01/22/22   3.  Pt will report no episodes of SUI with sneezing, laughing, coughing, walking in order to reduce pad usage and improve personal hygiene.  Baseline: making good progress - but sometimes she will having sneezing attacks in which she cannot hold urine back Goal status: IN PROGRESS 03/05/22   4.  Patient will report no  episodes of nocturnal enuresis and no greater than 1x/night nocturia in order to get better rest.  Baseline:  Goal status: MET 03/05/22   5.  Pt will be able to ambulate >20 minutes without increased abdominal pain in order to perform community ambulation.  Baseline: She can walk for 15 minutes (not attempted 20), but reports having some tightness/numbness in Rt groin - this comes on after the first couple of minutes and then she is able to stretch out of this discomfort and continue walking.  Goal status: IN PROGRESS 03/05/22   6.  Pt will improve all impaired hip strength by one muscle grade in order to demonstrate improved functional strength and be able to ambulate and perform stairs with less difficulty. Baseline: Most muscle groups have improved by full muscle grade with exception of Rt hip abduction/extension Goal status: IN PROGRESS 03/05/22   7.  Pt will report no greater than 1/10 Rt abdominal pain and improved scar tissue mobility.  Baseline: Pt had one episode of pain in the last week, but in general is not having any other than with walking. Goal status: IN PROGRESS 03/05/22   PLAN: PT FREQUENCY: 1x/week   PT DURATION: 10 weeks   PLANNED INTERVENTIONS: Therapeutic exercises, Therapeutic activity, Neuromuscular re-education, Balance training, Gait training, Patient/Family education,  Joint mobilization, Dry Needling, Biofeedback, and Manual therapy   PLAN FOR NEXT SESSION: Bladder retraining check; abdominal mobility exercises; core/pelvic floor strengthening progressions. Focus on hip extension/abduction strength and hip flexor mobility.       Heather Roberts, PT, DPT07/27/239:01 AM

## 2022-03-10 ENCOUNTER — Ambulatory Visit: Payer: Medicare Other

## 2022-03-18 NOTE — Therapy (Unsigned)
OUTPATIENT PHYSICAL THERAPY TREATMENT NOTE   Patient Name: Laura Mcdonald MRN: [id number] DOB:Jun 05, 1956, 66 y.o., female Today's Date: 03/18/2022  Progress Note Reporting Period 12/25/21 to 03/05/22  See note below for Objective Data and Assessment of Progress/Goals.      PCP: Isaac Bliss, Rayford Halsted, MD REFERRING PROVIDER: Georganna Skeans, MD  END OF SESSION:        Past Medical History:  Diagnosis Date   Allergy    Cancer [facility])    cervical anc rectal   GERD (gastroesophageal reflux disease)    past hx but better wirg weight loss    History of kidney stones    seen on a scan no treatment    Mild intermittent asthma    seasonal or allery induced   Past Surgical History:  Procedure Laterality Date   BIOPSY  01/15/2020   Procedure: BIOPSY;  Surgeon: Irving Copas., MD;  Location: [facility] ENDOSCOPY;  Service: Gastroenterology;;   cervical cancer surgery  1995~   Dr Stann Mainland; "WHIP surgery"   COLONOSCOPY WITH PROPOFOL N/A 01/15/2020   Procedure: COLONOSCOPY WITH PROPOFOL;  Surgeon: Irving Copas., MD;  Location: [place];  Service: Gastroenterology;  Laterality: N/A;   COLOSTOMY REVERSAL N/A 04/26/2020   Procedure: ILEOSTOMY REVERSAL;  Surgeon: Leighton Ruff, MD;  Location: [facility];  Service: General;  Laterality: N/A;   HAND SURGERY Right    SUBMUCOSAL LIFTING INJECTION  01/15/2020   Procedure: SUBMUCOSAL LIFTING INJECTION;  Surgeon: Irving Copas., MD;  Location: [place];  Service: Gastroenterology;;   tubal preganancy     XI ROBOTIC ASSISTED LOWER ANTERIOR RESECTION N/A 02/15/2020   Procedure: ROBOTIC LOW ANTERIOR RESECTION, DIVERTING ILEOSTOMY;  Surgeon: Leighton Ruff, MD;  Location: [facility];  Service: General;  Laterality: N/A;   Patient Active Problem List   Diagnosis Date Noted   Vitamin D deficiency 05/28/2021   Rectal cancer ([place]) 01/22/2020   Rectal polyp 12/31/2019   Abnormal colonoscopy 12/31/2019   Mild intermittent  asthma    Cervical cancer ([place])    KNEE PAIN, RIGHT 03/13/2008   ALLERGIC RHINITIS 04/29/2007   GERD 04/29/2007    REFERRING DIAG: L90.5 (ICD-10-CM) - Scar conditions and fibrosis of skin  THERAPY DIAG:  No diagnosis found.  Rationale for Evaluation and Treatment Rehabilitation  PERTINENT HISTORY: LAR/colostomy reversal, ileostomy 2021, constipation, rectal cancer, cervical cancer, tubal pregnancy with surgical resolution  PRECAUTIONS: NA  SUBJECTIVE: Pt states that she has been exercising and she is feeling more flexible. She has been doing a lot to help husband move his office. She had one episode of pain when she was bending over over and then returned to standing; she states there was just a grab in her Rt side. Cutting out caffeine has helped with urinary incontinence, but now she is having more difficulty with bowel movements. She is only waking 1x/night and having less SUI. She states that urge suppression technique is very helpful.   PAIN:  Are you having pain? Yes: NPRS scale: 0/10 Pain location: abdomen Pain description: soreness Aggravating factors: not moving Relieving factors: stretching   SUBJECTIVE 12/25/21:  SUBJECTIVE STATEMENT: Patient returns for PT after local excision of sebaceous cyst that developed after ileostomy reversal. She states that her husband has been diagnosed with metastatic prostate cancer and life has been very stressful. She states that the stress impacts her abdominal. She is still having pain with pants over scar tissue. She will have sharp pain at ileostomy side when she bends forwards, getting out of a chair, and walking. With walking, she feels weakness in the front of her Rt hip and LE.      Patient confirms identification and approves PT to assess pelvic  floor and treatment Yes     PAIN:  Are you having pain? Yes NPRS scale: 5/10   PRECAUTIONS: None   WEIGHT BEARING RESTRICTIONS No   FALLS:  Has patient fallen in last 6 months? No   LIVING ENVIRONMENT: Lives with: lives with their family Lives in: House/apartment     OCCUPATION: Retired   PLOF: Independent   PATIENT GOALS To decrease pain   PERTINENT HISTORY:  LAR/colostomy reversal, ileostomy 2021, constipation, rectal cancer, cervical cancer, tubal pregnancy with surgical resolution Sexual abuse: No   BOWEL MOVEMENT Pain with bowel movement: No Type of bowel movement:Frequency every time she urinates (painful to hold) and Strain No Fully empty rectum: No Leakage: No Pads: No Fiber supplement: No   URINATION Pain with urination: No Fully empty bladder: No Stream: Strong Urgency: Yes: when she is in the bathroom Frequency: every 2 hours - not sure; nocturnal enuresis with pad and 2x/night nocturia - changes pad each time she gets up to urinate Leakage: Urge to void, Coughing, Sneezing, Laughing, and walking Pads: Yes: 4-5   INTERCOURSE Pain with intercourse:  not since cancer diagnosis of husband Ability to have vaginal penetration:  Yes: - Climax: yes     PREGNANCY Vaginal deliveries 5 Tearing Yes: - C-section deliveries 0 Currently pregnant No       OBJECTIVE 03/05/22: see charts for updated lumbar A/ROM and hip strength values; decreased Rt hip extension A/ROM and flexibility tested in prone compared to Rt; scar tissue restriction and mild tenderness still palpable in Rt upper/lower quadrant  01/22/22: Internal pelvic floor exam performed vaginally demonstrating 1/5 strength initially and 2/5 after contraction training and 4 second endurance hold. See below for updated lumbar A/ROM.    12/25/21: COGNITION:            Overall cognitive status: Within functional limits for tasks assessed                          SENSATION:            Light touch:  Appears intact            Proprioception: Appears intact     FUNCTIONAL TESTS:  Sit-stand: pulling in Rt abdomen   GAIT:   Comments: WNL   POSTURE:  Forward head/rounded shoulders, thoracic kyphosis, posterior pelvic tilt   LUMBARAROM/PROM   A/PROM A/PROM  12/25/2021 A/ROM 01/22/22 A/ROM 03/05/22  Flexion 50% 75% 90%, little pull in Bil groin  Extension 50% 75%, mild tightness anteriorly 75%, mild pull anterior Rt hip  Right lateral flexion 75% 100%, pulling in abdomen 100%  Left lateral flexion 75% 100% pulling in abdomen 100% mild pull in Rt abdomen  Right rotation 50%, pull in Rt 75% 75%  Left rotation 75% 75% 75%   (Blank rows = not tested)     LE MMT:   MMT Right  12/25/2021 Left 12/25/2021 Right 03/05/22 Left 03/05/22  Hip flexion 3+ 4+ 4/5 5/5  Hip extension 3, pain in anterior Rt hip/lower abodmen 4+ 3/5 4+/5  Hip abduction 3-, Rt lower quadrant pressure 4+ 3+/5, no pain 4+/5  Hip adduction 3 4+ 4-/5 4+/5  Hip internal rotation 4- 4- 4+/5 4+/5  Hip external rotation 4- 4- 4+/5 4+/5    PELVIC MMT:  will assess in future sessions           PALPATION:   General  tenderness throughout rt abdomen and anterior Rt hip/groin; some tenderness into lateral Rt hip and anterior Rt thigh       TODAY'S TREATMENT 03/04/22 RE-EVAL Exercises: Stretches/mobility: Marcello Moores stretch 2 min on Rt Strengthening: Straight leg raise 10x Rt Regular bridges focusing on height, glute squeeze, and anterior stretch, 2 x 10 Sidelying leg lift 10x Rt Self-care: Fiber intake to help with constipation   TREATMENT 02/26/22 Manual: Soft tissue mobilization: Abdomen, Rt upper/lower quadrant Scar tissue mobilization: Abdominal scar tissue Exercises: Stretches/mobility: Open books 10x bil Seated side bending 10x bil Seated open books 10x bil Bent knee fall out 10x   TREATMENT 02/19/22 Neuromuscular re-education: Core facilitation: Seated horizontal abduction with core facilitation,  green band, 3 x 10 D2 flexion/extension seated, red band, 2 x 10 bil, focus on core control Anterior weight hold with marching 3 x 10, 2lb weight Pallof press red band, 2 x 10 bil Seated clam with focus on postural control/core stability, 2 x 10, blue band Self-care: Strict bladder retraining        PATIENT EDUCATION:  Education details: HEP progressions; see above self-care Person educated: Patient Education method: Explanation, Demonstration, Tactile cues, Verbal cues, and Handouts Education comprehension: verbalized understanding     HOME EXERCISE PROGRAM: [id number]   ASSESSMENT:   CLINICAL IMPRESSION: Pt has made good progress since returning to pelvic floor PT demonstrated by increases in lumbar A/ROM, improved walking ability, increased Bil hip strength, decreased abdominal tenderness, decreased nocturia, and improving urinary incontinence. However, she is still having leaking with significant sneezes, some pain with terminal hip extension (which is limited) in ambulation, weakness most notably in hip extension/abduction on Rt, and discomfort with return to standing position from bending. Believe weakness in hip extension/abduction is limiting terminal hip extension, promoting difficulty with full ROM, flexibility, and mobilizing scar tissue. Due to this, we performed some targeted strengthening and mobility of these muscle groups with difficulty, but good tolerance. Patient will continue to benefit from skilled PT intervention in order to improve Rt abdominal pain, decrease bladder dysfunction, and initiate/progress functional strengthening program.      OBJECTIVE IMPAIRMENTS decreased activity tolerance, decreased coordination, decreased endurance, decreased mobility, decreased ROM, decreased strength, hypomobility, increased fascial restrictions, increased muscle spasms, impaired flexibility, and pain.    ACTIVITY LIMITATIONS  sit<>stand, walking, bending .    PERSONAL FACTORS  3+ comorbidities: LAR/colostomy reversal, ileostomy 2021, constipation, rectal cancer, cervical cancer, tubal pregnancy with surgical resolution, excision of sebaceous cyst  are also affecting patient's functional outcome.      REHAB POTENTIAL: Good   CLINICAL DECISION MAKING: Stable/uncomplicated   EVALUATION COMPLEXITY: Low     GOALS: Goals reviewed with patient? Yes   SHORT TERM GOALS: Target date: 01/22/2022  - updated 01/22/22   Pt will be independent with HEP.    Baseline: Goal status: MET - 01/22/22   2.  Pt will be able to perform bed mobility and sit<>stand without any increase in abdominal pain and appropriate pressure  management.  Baseline:  Goal status: MET 01/22/22       LONG TERM GOALS: Target date: 03/05/2022  - updated 03/05/22   Pt will be independent with advanced HEP.    Baseline:  Goal status: IN PROGRESS   2.  Pt will improve all impaired lumbar A/ROM by 25% in order to improve functional mobility.  Baseline:  Goal status: MET 01/22/22   3.  Pt will report no episodes of SUI with sneezing, laughing, coughing, walking in order to reduce pad usage and improve personal hygiene.  Baseline: making good progress - but sometimes she will having sneezing attacks in which she cannot hold urine back Goal status: IN PROGRESS 03/05/22   4.  Patient will report no episodes of nocturnal enuresis and no greater than 1x/night nocturia in order to get better rest.  Baseline:  Goal status: MET 03/05/22   5.  Pt will be able to ambulate >20 minutes without increased abdominal pain in order to perform community ambulation.  Baseline: She can walk for 15 minutes (not attempted 20), but reports having some tightness/numbness in Rt groin - this comes on after the first couple of minutes and then she is able to stretch out of this discomfort and continue walking.  Goal status: IN PROGRESS 03/05/22   6.  Pt will improve all impaired hip strength by one muscle grade in order to  demonstrate improved functional strength and be able to ambulate and perform stairs with less difficulty. Baseline: Most muscle groups have improved by full muscle grade with exception of Rt hip abduction/extension Goal status: IN PROGRESS 03/05/22   7.  Pt will report no greater than 1/10 Rt abdominal pain and improved scar tissue mobility.  Baseline: Pt had one episode of pain in the last week, but in general is not having any other than with walking. Goal status: IN PROGRESS 03/05/22   PLAN: PT FREQUENCY: 1x/week   PT DURATION: 10 weeks   PLANNED INTERVENTIONS: Therapeutic exercises, Therapeutic activity, Neuromuscular re-education, Balance training, Gait training, Patient/Family education, Joint mobilization, Dry Needling, Biofeedback, and Manual therapy   PLAN FOR NEXT SESSION: Bladder retraining check; abdominal mobility exercises; core/pelvic floor strengthening progressions. Focus on hip extension/abduction strength and hip flexor mobility.       Heather Roberts, PT, DPT08/09/235:28 PM

## 2022-03-19 ENCOUNTER — Encounter: Payer: Self-pay | Admitting: Physical Therapy

## 2022-03-19 ENCOUNTER — Ambulatory Visit: Payer: Medicare Other | Attending: General Surgery | Admitting: Physical Therapy

## 2022-03-19 DIAGNOSIS — R293 Abnormal posture: Secondary | ICD-10-CM | POA: Insufficient documentation

## 2022-03-19 DIAGNOSIS — R279 Unspecified lack of coordination: Secondary | ICD-10-CM | POA: Diagnosis not present

## 2022-03-19 DIAGNOSIS — R252 Cramp and spasm: Secondary | ICD-10-CM | POA: Diagnosis not present

## 2022-03-19 DIAGNOSIS — M6281 Muscle weakness (generalized): Secondary | ICD-10-CM | POA: Diagnosis not present

## 2022-03-25 NOTE — Therapy (Addendum)
OUTPATIENT PHYSICAL THERAPY TREATMENT NOTE   Patient Name: Laura Mcdonald MRN: [id number] DOB:14-Jan-1956, 66 y.o., female Today's Date: 03/26/2022       PCP: Isaac Bliss, Rayford Halsted, MD REFERRING PROVIDER: Georganna Skeans, MD  END OF SESSION:   PT End of Session - 03/26/22 0803     Visit Number 8    Date for PT Re-Evaluation 04/30/22    Authorization Type [employer]    PT Start Time 0758    PT Stop Time 0837    PT Time Calculation (min) 39 min    Activity Tolerance Patient tolerated treatment well    Behavior During Therapy WFL for tasks assessed/performed                  Past Medical History:  Diagnosis Date   Allergy    Cancer ([place])    cervical anc rectal   GERD (gastroesophageal reflux disease)    past hx but better wirg weight loss    History of kidney stones    seen on a scan no treatment    Mild intermittent asthma    seasonal or allery induced   Past Surgical History:  Procedure Laterality Date   BIOPSY  01/15/2020   Procedure: BIOPSY;  Surgeon: Irving Copas., MD;  Location: [facility] ENDOSCOPY;  Service: Gastroenterology;;   cervical cancer surgery  1995~   Dr Stann Mainland; "WHIP surgery"   COLONOSCOPY WITH PROPOFOL N/A 01/15/2020   Procedure: COLONOSCOPY WITH PROPOFOL;  Surgeon: Irving Copas., MD;  Location: [place];  Service: Gastroenterology;  Laterality: N/A;   COLOSTOMY REVERSAL N/A 04/26/2020   Procedure: ILEOSTOMY REVERSAL;  Surgeon: Leighton Ruff, MD;  Location: [facility];  Service: General;  Laterality: N/A;   HAND SURGERY Right    SUBMUCOSAL LIFTING INJECTION  01/15/2020   Procedure: SUBMUCOSAL LIFTING INJECTION;  Surgeon: Irving Copas., MD;  Location: [place];  Service: Gastroenterology;;   tubal preganancy     XI ROBOTIC ASSISTED LOWER ANTERIOR RESECTION N/A 02/15/2020   Procedure: ROBOTIC LOW ANTERIOR RESECTION, DIVERTING ILEOSTOMY;  Surgeon: Leighton Ruff, MD;  Location: [facility];  Service: General;   Laterality: N/A;   Patient Active Problem List   Diagnosis Date Noted   Vitamin D deficiency 05/28/2021   Rectal cancer ([place]) 01/22/2020   Rectal polyp 12/31/2019   Abnormal colonoscopy 12/31/2019   Mild intermittent asthma    Cervical cancer ([place])    KNEE PAIN, RIGHT 03/13/2008   ALLERGIC RHINITIS 04/29/2007   GERD 04/29/2007    REFERRING DIAG: L90.5 (ICD-10-CM) - Scar conditions and fibrosis of skin  THERAPY DIAG:  Cramp and spasm  Unspecified lack of coordination  Muscle weakness (generalized)  Abnormal posture  Rationale for Evaluation and Treatment Rehabilitation  PERTINENT HISTORY: LAR/colostomy reversal, ileostomy 2021, constipation, rectal cancer, cervical cancer, tubal pregnancy with surgical resolution  PRECAUTIONS: NA  SUBJECTIVE: Pt states that she has been walking 10 minutes with bearable pain, but still up to 4-5/10.   PAIN:  Are you having pain? Yes: NPRS scale: 0/10 Pain location: abdomen Pain description: soreness Aggravating factors: not moving Relieving factors: stretching   SUBJECTIVE 12/25/21:  SUBJECTIVE STATEMENT: Patient returns for PT after local excision of sebaceous cyst that developed after ileostomy reversal. She states that her husband has been diagnosed with metastatic prostate cancer and life has been very stressful. She states that the stress impacts her abdominal. She is still having pain with pants over scar tissue. She will have sharp pain at ileostomy side when she bends forwards, getting out of a chair, and walking. With walking, she feels weakness in the front of her Rt hip and LE.      Patient confirms identification and approves PT to assess pelvic floor and treatment Yes     PAIN:  Are you having pain? Yes NPRS scale: 5/10   PRECAUTIONS:  None   WEIGHT BEARING RESTRICTIONS No   FALLS:  Has patient fallen in last 6 months? No   LIVING ENVIRONMENT: Lives with: lives with their family Lives in: House/apartment     OCCUPATION: Retired   PLOF: Independent   PATIENT GOALS To decrease pain   PERTINENT HISTORY:  LAR/colostomy reversal, ileostomy 2021, constipation, rectal cancer, cervical cancer, tubal pregnancy with surgical resolution Sexual abuse: No   BOWEL MOVEMENT Pain with bowel movement: No Type of bowel movement:Frequency every time she urinates (painful to hold) and Strain No Fully empty rectum: No Leakage: No Pads: No Fiber supplement: No   URINATION Pain with urination: No Fully empty bladder: No Stream: Strong Urgency: Yes: when she is in the bathroom Frequency: every 2 hours - not sure; nocturnal enuresis with pad and 2x/night nocturia - changes pad each time she gets up to urinate Leakage: Urge to void, Coughing, Sneezing, Laughing, and walking Pads: Yes: 4-5   INTERCOURSE Pain with intercourse:  not since cancer diagnosis of husband Ability to have vaginal penetration:  Yes: - Climax: yes     PREGNANCY Vaginal deliveries 5 Tearing Yes: - C-section deliveries 0 Currently pregnant No       OBJECTIVE 03/05/22: see charts for updated lumbar A/ROM and hip strength values; decreased Rt hip extension A/ROM and flexibility tested in prone compared to Rt; scar tissue restriction and mild tenderness still palpable in Rt upper/lower quadrant  01/22/22: Internal pelvic floor exam performed vaginally demonstrating 1/5 strength initially and 2/5 after contraction training and 4 second endurance hold. See below for updated lumbar A/ROM.    12/25/21: COGNITION:            Overall cognitive status: Within functional limits for tasks assessed                          SENSATION:            Light touch: Appears intact            Proprioception: Appears intact     FUNCTIONAL TESTS:  Sit-stand: pulling  in Rt abdomen   GAIT:   Comments: WNL   POSTURE:  Forward head/rounded shoulders, thoracic kyphosis, posterior pelvic tilt   LUMBARAROM/PROM   A/PROM A/PROM  12/25/2021 A/ROM 01/22/22 A/ROM 03/05/22  Flexion 50% 75% 90%, little pull in Bil groin  Extension 50% 75%, mild tightness anteriorly 75%, mild pull anterior Rt hip  Right lateral flexion 75% 100%, pulling in abdomen 100%  Left lateral flexion 75% 100% pulling in abdomen 100% mild pull in Rt abdomen  Right rotation 50%, pull in Rt 75% 75%  Left rotation 75% 75% 75%   (Blank rows = not tested)     LE MMT:   MMT Right  12/25/2021 Left 12/25/2021 Right 03/05/22 Left 03/05/22  Hip flexion 3+ 4+ 4/5 5/5  Hip extension 3, pain in anterior Rt hip/lower abodmen 4+ 3/5 4+/5  Hip abduction 3-, Rt lower quadrant pressure 4+ 3+/5, no pain 4+/5  Hip adduction 3 4+ 4-/5 4+/5  Hip internal rotation 4- 4- 4+/5 4+/5  Hip external rotation 4- 4- 4+/5 4+/5    PELVIC MMT:  will assess in future sessions           PALPATION:   General  tenderness throughout rt abdomen and anterior Rt hip/groin; some tenderness into lateral Rt hip and anterior Rt thigh       TODAY'S TREATMENT 03/26/22  Exercise: Nustep L3 x 10 min (last 30 sec reduced to L1) Hip abduction at step Hip flexor stretch Toe tapping step - 20x Shoulder ext - green - 20x Squats - 10x Seated hip rotation red band IR/ER - 10x each way Seated on pball cirlces and hip flex - 10x   TREATMENT 03/19/22 Manual Abdominal fascial release around large intestine  Self-care: Bowel retraining - educated on meals  TREATMENT 03/04/22  Exercises: Stretches/mobility: Marcello Moores stretch 2 min on Rt Strengthening: Straight leg raise 10x Rt Regular bridges focusing on height, glute squeeze, and anterior stretch, 2 x 10 Sidelying leg lift 10x Rt Self-care: Fiber intake to help with constipation   TREATMENT 02/26/22 Manual: Soft tissue mobilization: Abdomen, Rt upper/lower  quadrant Scar tissue mobilization: Abdominal scar tissue Exercises: Stretches/mobility: Open books 10x bil Seated side bending 10x bil Seated open books 10x bil Bent knee fall out 10x         PATIENT EDUCATION:  Education details: HEP progressions; see above self-care Person educated: Patient Education method: Explanation, Demonstration, Tactile cues, Verbal cues, and Handouts Education comprehension: verbalized understanding     HOME EXERCISE PROGRAM: [id number]   ASSESSMENT:   CLINICAL IMPRESSION: Pt was able to progress core and hip strength with more standing exercises today.  Pt is doing better with pain staying at or below 5/10 and only pain when walking. She will benefit from skilled PT to continue to address strength and mobility.     OBJECTIVE IMPAIRMENTS decreased activity tolerance, decreased coordination, decreased endurance, decreased mobility, decreased ROM, decreased strength, hypomobility, increased fascial restrictions, increased muscle spasms, impaired flexibility, and pain.    ACTIVITY LIMITATIONS  sit<>stand, walking, bending .    PERSONAL FACTORS 3+ comorbidities: LAR/colostomy reversal, ileostomy 2021, constipation, rectal cancer, cervical cancer, tubal pregnancy with surgical resolution, excision of sebaceous cyst  are also affecting patient's functional outcome.      REHAB POTENTIAL: Good   CLINICAL DECISION MAKING: Stable/uncomplicated   EVALUATION COMPLEXITY: Low     GOALS: Goals reviewed with patient? Yes   SHORT TERM GOALS: Target date: 01/22/2022  - updated 01/22/22   Pt will be independent with HEP.    Baseline: Goal status: MET - 01/22/22   2.  Pt will be able to perform bed mobility and sit<>stand without any increase in abdominal pain and appropriate pressure management.  Baseline:  Goal status: MET 01/22/22       LONG TERM GOALS: Target date: 03/05/2022  - updated 03/05/22   Pt will be independent with advanced HEP.     Baseline:  Goal status: IN PROGRESS   2.  Pt will improve all impaired lumbar A/ROM by 25% in order to improve functional mobility.  Baseline:  Goal status: MET 01/22/22   3.  Pt will report no episodes of SUI with sneezing,  laughing, coughing, walking in order to reduce pad usage and improve personal hygiene.  Baseline: making good progress - but sometimes she will having sneezing attacks in which she cannot hold urine back Goal status: IN PROGRESS 03/05/22   4.  Patient will report no episodes of nocturnal enuresis and no greater than 1x/night nocturia in order to get better rest.  Baseline:  Goal status: MET 03/05/22   5.  Pt will be able to ambulate >20 minutes without increased abdominal pain in order to perform community ambulation.  Baseline: She can walk for 15 minutes (not attempted 20), but reports having some tightness/numbness in Rt groin - this comes on after the first couple of minutes and then she is able to stretch out of this discomfort and continue walking.  Goal status: IN PROGRESS 03/05/22   6.  Pt will improve all impaired hip strength by one muscle grade in order to demonstrate improved functional strength and be able to ambulate and perform stairs with less difficulty. Baseline: Most muscle groups have improved by full muscle grade with exception of Rt hip abduction/extension Goal status: IN PROGRESS 03/05/22   7.  Pt will report no greater than 1/10 Rt abdominal pain and improved scar tissue mobility.  Baseline: I wear the compression sleeve when walking, the pain is still up 4-5/10 Goal status: IN PROGRESS 03/26/22    PLAN: PT FREQUENCY: 1x/week   PT DURATION: 10 weeks   PLANNED INTERVENTIONS: Therapeutic exercises, Therapeutic activity, Neuromuscular re-education, Balance training, Gait training, Patient/Family education, Joint mobilization, Dry Needling, Biofeedback, and Manual therapy   PLAN FOR NEXT SESSION: continue hip extension/abduction strength and hip  flexor mobility; re-assess and update HEP     [employer], PT 03/26/22 8:05 AM   PHYSICAL THERAPY DISCHARGE SUMMARY  Visits from Start of Care: 8  Current functional level related to goals / functional outcomes: See above goals   Remaining deficits: See above details   Education / Equipment: HEP   Patient agrees to discharge. Patient goals were partially met. Patient is being discharged due to not returning since the last visit.  Gustavus Bryant, PT 05/22/22 9:25 AM

## 2022-03-26 ENCOUNTER — Ambulatory Visit: Payer: Medicare Other | Admitting: Physical Therapy

## 2022-03-26 ENCOUNTER — Encounter: Payer: Self-pay | Admitting: Physical Therapy

## 2022-03-26 DIAGNOSIS — R279 Unspecified lack of coordination: Secondary | ICD-10-CM | POA: Diagnosis not present

## 2022-03-26 DIAGNOSIS — R252 Cramp and spasm: Secondary | ICD-10-CM

## 2022-03-26 DIAGNOSIS — M6281 Muscle weakness (generalized): Secondary | ICD-10-CM | POA: Diagnosis not present

## 2022-03-26 DIAGNOSIS — R293 Abnormal posture: Secondary | ICD-10-CM | POA: Diagnosis not present

## 2022-04-01 NOTE — Therapy (Deleted)
OUTPATIENT PHYSICAL THERAPY TREATMENT NOTE   Patient Name: Laura Mcdonald MRN: [id number] DOB:05-07-1956, 66 y.o., female Today's Date: 04/01/2022       PCP: Isaac Bliss, Rayford Halsted, MD REFERRING PROVIDER: Georganna Skeans, MD  END OF SESSION:          Past Medical History:  Diagnosis Date   Allergy    Cancer [facility])    cervical anc rectal   GERD (gastroesophageal reflux disease)    past hx but better wirg weight loss    History of kidney stones    seen on a scan no treatment    Mild intermittent asthma    seasonal or allery induced   Past Surgical History:  Procedure Laterality Date   BIOPSY  01/15/2020   Procedure: BIOPSY;  Surgeon: Irving Copas., MD;  Location: [facility] ENDOSCOPY;  Service: Gastroenterology;;   cervical cancer surgery  1995~   Dr Stann Mainland; "WHIP surgery"   COLONOSCOPY WITH PROPOFOL N/A 01/15/2020   Procedure: COLONOSCOPY WITH PROPOFOL;  Surgeon: Irving Copas., MD;  Location: [place];  Service: Gastroenterology;  Laterality: N/A;   COLOSTOMY REVERSAL N/A 04/26/2020   Procedure: ILEOSTOMY REVERSAL;  Surgeon: Leighton Ruff, MD;  Location: [facility];  Service: General;  Laterality: N/A;   HAND SURGERY Right    SUBMUCOSAL LIFTING INJECTION  01/15/2020   Procedure: SUBMUCOSAL LIFTING INJECTION;  Surgeon: Irving Copas., MD;  Location: [place];  Service: Gastroenterology;;   tubal preganancy     XI ROBOTIC ASSISTED LOWER ANTERIOR RESECTION N/A 02/15/2020   Procedure: ROBOTIC LOW ANTERIOR RESECTION, DIVERTING ILEOSTOMY;  Surgeon: Leighton Ruff, MD;  [facility];  Service: General;  Laterality: N/A;   Patient Active Problem List   Diagnosis Date Noted   Vitamin D deficiency 05/28/2021   Rectal cancer ([place]) 01/22/2020   Rectal polyp 12/31/2019   Abnormal colonoscopy 12/31/2019   Mild intermittent asthma    Cervical cancer ([place])    KNEE PAIN, RIGHT 03/13/2008   ALLERGIC RHINITIS 04/29/2007   GERD 04/29/2007     REFERRING DIAG: L90.5 (ICD-10-CM) - Scar conditions and fibrosis of skin  THERAPY DIAG:  No diagnosis found.  Rationale for Evaluation and Treatment Rehabilitation  PERTINENT HISTORY: LAR/colostomy reversal, ileostomy 2021, constipation, rectal cancer, cervical cancer, tubal pregnancy with surgical resolution  PRECAUTIONS: NA  SUBJECTIVE: Pt states that she has been walking 10 minutes with bearable pain, but still up to 4-5/10.   PAIN:  Are you having pain? Yes: NPRS scale: 0/10 Pain location: abdomen Pain description: soreness Aggravating factors: not moving Relieving factors: stretching   SUBJECTIVE 12/25/21:  SUBJECTIVE STATEMENT: Patient returns for PT after local excision of sebaceous cyst that developed after ileostomy reversal. She states that her husband has been diagnosed with metastatic prostate cancer and life has been very stressful. She states that the stress impacts her abdominal. She is still having pain with pants over scar tissue. She will have sharp pain at ileostomy side when she bends forwards, getting out of a chair, and walking. With walking, she feels weakness in the front of her Rt hip and LE.      Patient confirms identification and approves PT to assess pelvic floor and treatment Yes     PAIN:  Are you having pain? Yes NPRS scale: 5/10   PRECAUTIONS: None   WEIGHT BEARING RESTRICTIONS No   FALLS:  Has patient fallen in last 6 months? No   LIVING ENVIRONMENT: Lives with: lives with their family Lives in: House/apartment     OCCUPATION: Retired   PLOF: Independent   PATIENT GOALS To decrease pain   PERTINENT HISTORY:  LAR/colostomy reversal, ileostomy 2021, constipation, rectal cancer, cervical cancer, tubal pregnancy with surgical resolution Sexual  abuse: No   BOWEL MOVEMENT Pain with bowel movement: No Type of bowel movement:Frequency every time she urinates (painful to hold) and Strain No Fully empty rectum: No Leakage: No Pads: No Fiber supplement: No   URINATION Pain with urination: No Fully empty bladder: No Stream: Strong Urgency: Yes: when she is in the bathroom Frequency: every 2 hours - not sure; nocturnal enuresis with pad and 2x/night nocturia - changes pad each time she gets up to urinate Leakage: Urge to void, Coughing, Sneezing, Laughing, and walking Pads: Yes: 4-5   INTERCOURSE Pain with intercourse:  not since cancer diagnosis of husband Ability to have vaginal penetration:  Yes: - Climax: yes     PREGNANCY Vaginal deliveries 5 Tearing Yes: - C-section deliveries 0 Currently pregnant No       OBJECTIVE 03/05/22: see charts for updated lumbar A/ROM and hip strength values; decreased Rt hip extension A/ROM and flexibility tested in prone compared to Rt; scar tissue restriction and mild tenderness still palpable in Rt upper/lower quadrant  01/22/22: Internal pelvic floor exam performed vaginally demonstrating 1/5 strength initially and 2/5 after contraction training and 4 second endurance hold. See below for updated lumbar A/ROM.    12/25/21: COGNITION:            Overall cognitive status: Within functional limits for tasks assessed                          SENSATION:            Light touch: Appears intact            Proprioception: Appears intact     FUNCTIONAL TESTS:  Sit-stand: pulling in Rt abdomen   GAIT:   Comments: WNL   POSTURE:  Forward head/rounded shoulders, thoracic kyphosis, posterior pelvic tilt   LUMBARAROM/PROM   A/PROM A/PROM  12/25/2021 A/ROM 01/22/22 A/ROM 03/05/22  Flexion 50% 75% 90%, little pull in Bil groin  Extension 50% 75%, mild tightness anteriorly 75%, mild pull anterior Rt hip  Right lateral flexion 75% 100%, pulling in abdomen 100%  Left lateral flexion 75% 100%  pulling in abdomen 100% mild pull in Rt abdomen  Right rotation 50%, pull in Rt 75% 75%  Left rotation 75% 75% 75%   (Blank rows = not tested)     LE MMT:   MMT Right  12/25/2021 Left 12/25/2021 Right 03/05/22 Left 03/05/22  Hip flexion 3+ 4+ 4/5 5/5  Hip extension 3, pain in anterior Rt hip/lower abodmen 4+ 3/5 4+/5  Hip abduction 3-, Rt lower quadrant pressure 4+ 3+/5, no pain 4+/5  Hip adduction 3 4+ 4-/5 4+/5  Hip internal rotation 4- 4- 4+/5 4+/5  Hip external rotation 4- 4- 4+/5 4+/5    PELVIC MMT:  will assess in future sessions           PALPATION:   General  tenderness throughout rt abdomen and anterior Rt hip/groin; some tenderness into lateral Rt hip and anterior Rt thigh       TODAY'S TREATMENT 03/26/22  Exercise: Nustep L3 x 10 min (last 30 sec reduced to L1) Hip abduction at step Hip flexor stretch Toe tapping step - 20x Shoulder ext - green - 20x Squats - 10x Seated hip rotation red band IR/ER - 10x each way Seated on pball cirlces and hip flex - 10x   TREATMENT 03/19/22 Manual Abdominal fascial release around large intestine  Self-care: Bowel retraining - educated on meals  TREATMENT 03/04/22  Exercises: Stretches/mobility: Marcello Moores stretch 2 min on Rt Strengthening: Straight leg raise 10x Rt Regular bridges focusing on height, glute squeeze, and anterior stretch, 2 x 10 Sidelying leg lift 10x Rt Self-care: Fiber intake to help with constipation   TREATMENT 02/26/22 Manual: Soft tissue mobilization: Abdomen, Rt upper/lower quadrant Scar tissue mobilization: Abdominal scar tissue Exercises: Stretches/mobility: Open books 10x bil Seated side bending 10x bil Seated open books 10x bil Bent knee fall out 10x         PATIENT EDUCATION:  Education details: HEP progressions; see above self-care Person educated: Patient Education method: Explanation, Demonstration, Tactile cues, Verbal cues, and Handouts Education comprehension: verbalized  understanding     HOME EXERCISE PROGRAM: [id number]   ASSESSMENT:   CLINICAL IMPRESSION: Pt was able to progress core and hip strength with more standing exercises today.  Pt is doing better with pain staying at or below 5/10 and only pain when walking. She will benefit from skilled PT to continue to address strength and mobility.     OBJECTIVE IMPAIRMENTS decreased activity tolerance, decreased coordination, decreased endurance, decreased mobility, decreased ROM, decreased strength, hypomobility, increased fascial restrictions, increased muscle spasms, impaired flexibility, and pain.    ACTIVITY LIMITATIONS  sit<>stand, walking, bending .    PERSONAL FACTORS 3+ comorbidities: LAR/colostomy reversal, ileostomy 2021, constipation, rectal cancer, cervical cancer, tubal pregnancy with surgical resolution, excision of sebaceous cyst  are also affecting patient's functional outcome.      REHAB POTENTIAL: Good   CLINICAL DECISION MAKING: Stable/uncomplicated   EVALUATION COMPLEXITY: Low     GOALS: Goals reviewed with patient? Yes   SHORT TERM GOALS: Target date: 01/22/2022  - updated 01/22/22   Pt will be independent with HEP.    Baseline: Goal status: MET - 01/22/22   2.  Pt will be able to perform bed mobility and sit<>stand without any increase in abdominal pain and appropriate pressure management.  Baseline:  Goal status: MET 01/22/22       LONG TERM GOALS: Target date: 03/05/2022  - updated 03/05/22   Pt will be independent with advanced HEP.    Baseline:  Goal status: IN PROGRESS   2.  Pt will improve all impaired lumbar A/ROM by 25% in order to improve functional mobility.  Baseline:  Goal status: MET 01/22/22   3.  Pt will report no episodes of SUI with sneezing, laughing,  coughing, walking in order to reduce pad usage and improve personal hygiene.  Baseline: making good progress - but sometimes she will having sneezing attacks in which she cannot hold urine back Goal  status: IN PROGRESS 03/05/22   4.  Patient will report no episodes of nocturnal enuresis and no greater than 1x/night nocturia in order to get better rest.  Baseline:  Goal status: MET 03/05/22   5.  Pt will be able to ambulate >20 minutes without increased abdominal pain in order to perform community ambulation.  Baseline: She can walk for 15 minutes (not attempted 20), but reports having some tightness/numbness in Rt groin - this comes on after the first couple of minutes and then she is able to stretch out of this discomfort and continue walking.  Goal status: IN PROGRESS 03/05/22   6.  Pt will improve all impaired hip strength by one muscle grade in order to demonstrate improved functional strength and be able to ambulate and perform stairs with less difficulty. Baseline: Most muscle groups have improved by full muscle grade with exception of Rt hip abduction/extension Goal status: IN PROGRESS 03/05/22   7.  Pt will report no greater than 1/10 Rt abdominal pain and improved scar tissue mobility.  Baseline: I wear the compression sleeve when walking, the pain is still up 4-5/10 Goal status: IN PROGRESS 03/26/22    PLAN: PT FREQUENCY: 1x/week   PT DURATION: 10 weeks   PLANNED INTERVENTIONS: Therapeutic exercises, Therapeutic activity, Neuromuscular re-education, Balance training, Gait training, Patient/Family education, Joint mobilization, Dry Needling, Biofeedback, and Manual therapy   PLAN FOR NEXT SESSION: continue hip extension/abduction strength and hip flexor mobility; re-assess and update HEP     [employer], PT 04/01/22 10:24 AM

## 2022-04-02 ENCOUNTER — Ambulatory Visit: Payer: Medicare Other | Admitting: Physical Therapy

## 2022-04-22 ENCOUNTER — Telehealth: Payer: Self-pay

## 2022-04-22 ENCOUNTER — Telehealth: Payer: Self-pay | Admitting: Internal Medicine

## 2022-04-22 ENCOUNTER — Telehealth (INDEPENDENT_AMBULATORY_CARE_PROVIDER_SITE_OTHER): Payer: Medicare Other | Admitting: Family Medicine

## 2022-04-22 ENCOUNTER — Encounter: Payer: Self-pay | Admitting: Family Medicine

## 2022-04-22 VITALS — Temp 102.8°F | Ht 66.0 in

## 2022-04-22 DIAGNOSIS — J4520 Mild intermittent asthma, uncomplicated: Secondary | ICD-10-CM | POA: Diagnosis not present

## 2022-04-22 DIAGNOSIS — U071 COVID-19: Secondary | ICD-10-CM | POA: Diagnosis not present

## 2022-04-22 MED ORDER — NIRMATRELVIR/RITONAVIR (PAXLOVID)TABLET: 3.0000 | ORAL_TABLET | Freq: Two times a day (BID) | ORAL | 0 refills | Status: AC

## 2022-04-22 MED ORDER — ALBUTEROL SULFATE HFA 108 (90 BASE) MCG/ACT IN AERS: 2.0000 | INHALATION_SPRAY | Freq: Four times a day (QID) | RESPIRATORY_TRACT | 0 refills | Status: DC | PRN

## 2022-04-22 NOTE — Progress Notes (Signed)
Virtual Visit via Video Note I connected with Laura Mcdonald on 04/22/22 by a video enabled telemedicine application and verified that I am speaking with the correct person using two identifiers.  Location patient: home Location provider:work office Persons participating in the virtual visit: patient, provider  I discussed the limitations of evaluation and management by telemedicine and the availability of in person appointments. The patient expressed understanding and agreed to proceed.  Chief Complaint  Patient presents with   Covid Positive    Tested this morning, headaches, chills, 102.8 temp, took some ibuprofen; cough, runny nose, pressure around eyes.   HPI: Ms. Laura Mcdonald is a 66 year old female with history of asthma, GERD, and allergies complaining of a day of respiratory symptoms as described above. Symptoms started last night, fatigue and body aches. This morning , around 2:20 am, woke up with chills and temp 101.19F. Frontal pressure headache, sore throat, nasal congestion, anosmia,[place]usia, and nonproductive cough. Negative for CP, dyspnea, wheezing, palpitations, abdominal pain, nausea, vomiting, urinary symptoms, or a skin rash. Asthma, she has not had symptoms in a while.  She does have albuterol at home.  She has had more frequent stools, today x2, formed.  Negative for blood in the stool. She has taking ibuprofen. No recent travel. A friend from church was sick and Dx'ed with COVID 19 infection this past weekend. COVID-19 vaccination completed.  ROS: See pertinent positives and negatives per HPI.  Past Medical History:  Diagnosis Date   Allergy    Cancer ([place])    cervical anc rectal   GERD (gastroesophageal reflux disease)    past hx but better wirg weight loss    History of kidney stones    seen on a scan no treatment    Mild intermittent asthma    seasonal or allery induced   Past Surgical History:  Procedure Laterality Date   BIOPSY  01/15/2020   Procedure:  BIOPSY;  Surgeon: Rush Landmark Telford Nab., MD;  Location: [facility] ENDOSCOPY;  Service: Gastroenterology;;   cervical cancer surgery  1995~   Dr Stann Mainland; "WHIP surgery"   COLONOSCOPY WITH PROPOFOL N/A 01/15/2020   Procedure: COLONOSCOPY WITH PROPOFOL;  Surgeon: Irving Copas., MD;  Location: [place];  Service: Gastroenterology;  Laterality: N/A;   COLOSTOMY REVERSAL N/A 04/26/2020   Procedure: ILEOSTOMY REVERSAL;  Surgeon: Leighton Ruff, MD;  Location: [facility];  Service: General;  Laterality: N/A;   HAND SURGERY Right    SUBMUCOSAL LIFTING INJECTION  01/15/2020   Procedure: SUBMUCOSAL LIFTING INJECTION;  Surgeon: Irving Copas., MD;  Location: [facility] ENDOSCOPY;  Service: Gastroenterology;;   tubal preganancy     XI ROBOTIC ASSISTED LOWER ANTERIOR RESECTION N/A 02/15/2020   Procedure: ROBOTIC LOW ANTERIOR RESECTION, DIVERTING ILEOSTOMY;  Surgeon: Leighton Ruff, MD;  Location: [facility];  Service: General;  Laterality: N/A;    Family History  Problem Relation Age of Onset   CVA Mother    Lung cancer Father    Colon polyps Sister    Colon cancer Sister    Diverticulitis Sister    Cancer Brother 56       Prostate cancer    Esophageal cancer Daughter    Stomach cancer Neg Hx    Rectal cancer Neg Hx    Pancreatic cancer Neg Hx    Liver disease Neg Hx     Social History   Socioeconomic History   Marital status: Married    Spouse name: Not on file   Number of children: 5   Years  of education: 14   Highest education level: Associate degree: academic program  Occupational History   Occupation: health care aid   Tobacco Use   Smoking status: Former    Packs/day: 0.25    Years: 20.00    Total pack years: 5.00    Types: Cigarettes    Quit date: 08/11/1983    Years since quitting: 38.7   Smokeless tobacco: Never  Vaping Use   Vaping Use: Never used  Substance and Sexual Activity   Alcohol use: Never   Drug use: Never   Sexual activity: Not Currently    Birth  control/protection: None, Post-menopausal  Other Topics Concern   Not on file  Social History Narrative   Not on file   Social Determinants of Health   Financial Resource Strain: Medium Risk (07/22/2021)   Overall Financial Resource Strain (CARDIA)    Difficulty of Paying Living Expenses: Somewhat hard  Food Insecurity: Food Insecurity Present (07/22/2021)   Hunger Vital Sign    Worried About Running Out of Food in the Last Year: Sometimes true    Ran Out of Food in the Last Year: Never true  Transportation Needs: No Transportation Needs (07/22/2021)   PRAPARE - Hydrologist (Medical): No    Lack of Transportation (Non-Medical): No  Physical Activity: Insufficiently Active (07/22/2021)   Exercise Vital Sign    Days of Exercise per Week: 2 days    Minutes of Exercise per Session: 30 min  Stress: Stress Concern Present (07/22/2021)   [place]    Feeling of Stress : To some extent  Social Connections: Socially Integrated (07/22/2021)   Social Connection and Isolation Panel [NHANES]    Frequency of Communication with Friends and Family: More than three times a week    Frequency of Social Gatherings with Friends and Family: More than three times a week    Attends Religious Services: 1 to 4 times per year    Active Member of [employer] or Organizations: Yes    Attends Archivist Meetings: 1 to 4 times per year    Marital Status: Married  Human resources officer Violence: Not on file   Current Outpatient Medications:    albuterol (VENTOLIN HFA) 108 (90 Base) MCG/ACT inhaler, INHALE 2 PUFFS INTO THE LUNGS EVERY 6 HOURS AS NEEDED FOR WHEEZING OR SHORTNESS OF BREATH, Disp: 20.1 g, Rfl: 0   APPLE CIDER VINEGAR PO, Take 1 tablet by mouth daily. gummies a day, Disp: , Rfl:    BLACK ELDERBERRY PO, Take 500 mg by mouth daily., Disp: , Rfl:    loratadine (CLARITIN) 10 MG tablet, Take 10 mg by mouth  daily., Disp: , Rfl:    Multiple Vitamin (MULTIVITAMIN WITH MINERALS) TABS tablet, Take 1 tablet by mouth daily., Disp: , Rfl:   Current Facility-Administered Medications:    0.9 %  sodium chloride infusion, 500 mL, Intravenous, Once, Nandigam, Venia Minks, MD  EXAM:  VITALS per patient if applicable:Temp (!) 161.0 F (39.3 C) (Oral)   Ht '5\' 6"'$  (1.676 m)   BMI 31.64 kg/m   GENERAL: alert, oriented, appears well and in no acute distress  HEENT: atraumatic, conjunctiva clear, no obvious abnormalities on inspection of external nose and ears Nasal congestion and rhinorrhea.  NECK: normal movements of the head and neck  LUNGS: on inspection no signs of respiratory distress, breathing rate appears normal, no obvious gross SOB, gasping or wheezing  CV: no obvious cyanosis  MS: moves all visible extremities without noticeable abnormality  PSYCH/NEURO: pleasant and cooperative, no obvious depression or anxiety, speech and thought processing grossly intact  ASSESSMENT AND PLAN:  Discussed the following assessment and plan:  COVID-19 virus infection - Plan: nirmatrelvir/ritonavir EUA (PAXLOVID) 20 x 150 MG & 10 x '100MG'$  TABS We discussed Dx,possible complications and treatment options. She has a moderate case with risk for complications. We discussed oral antiviral options and side effects. She agrees with trying Paxlovid. Symptomatic treatment with plenty of fluids,rest,tylenol 500 mg 3-4 times per day prn. 5 to 7 days of quarantine. Her husband is checking on her periodically. Explained that cough and congestion may last a few more days and even weeks after acute symptoms have resolved. Clearly instructed about warning signs.  Mild intermittent asthma without complication - Plan: albuterol (VENTOLIN HFA) 108 (90 Base) MCG/ACT inhaler She is not having dyspnea or wheezing. Albuterol inhaler sent to her pharmacy. Instructed about warning signs.  We discussed possible serious and  likely etiologies, options for evaluation and workup, limitations of telemedicine visit vs in person visit, treatment, treatment risks and precautions. The patient was advised to call back or seek an in-person evaluation if the symptoms worsen or if the condition fails to improve as anticipated. I discussed the assessment and treatment plan with the patient. The patient was provided an opportunity to ask questions and all were answered. The patient agreed with the plan and demonstrated an understanding of the instructions.  Gera Inboden [place], MD

## 2022-04-22 NOTE — Telephone Encounter (Signed)
BPA triggered for worsening symptoms new cough, new weakness. Patient called to discuss. She says the cough started last night and has gotten worse as the day progresses. She failed to mention to the provider today during the visit due to it wasn't a bad cough. The weakness started today, she felt like her legs had weights on them trying to walk, once she had to hold on to the bed because she felt she got up too fast. Patient advised to call her PCP to let her know of the cough, since it is getting worse. Advised as below.   If cough remains the same or better: continue to treat with over the counter medications.  Hard candy or cough drops and drinking warm fluids. Adults can also use honey 2 tsp (10 ML) at bedtime.  If cough is becoming worse even with the use of over the counter medications and patient is not able to sleep at night, cough becomes productive with sputum that maybe yellow or green in color, contact PCP.  If patient has worsening weakness with inability to stand or if patient has to hold on to something to get balance, advise patient to call 911 and seek treatment in ED

## 2022-04-23 NOTE — Telephone Encounter (Signed)
error 

## 2022-05-07 ENCOUNTER — Telehealth: Payer: Self-pay | Admitting: Internal Medicine

## 2022-05-07 NOTE — Telephone Encounter (Signed)
Refill denied.  Patient needs a lab appointment.

## 2022-05-07 NOTE — Telephone Encounter (Signed)
Vitamin D, Ergocalciferol, (DRISDOL) 1.25 MG (50000 UNIT) CAPS capsule   [place], [place] Phone:  [phone]  Fax:  [phone]

## 2022-05-23 ENCOUNTER — Other Ambulatory Visit: Payer: Self-pay | Admitting: Internal Medicine

## 2022-05-23 DIAGNOSIS — J4520 Mild intermittent asthma, uncomplicated: Secondary | ICD-10-CM

## 2022-10-13 ENCOUNTER — Ambulatory Visit (INDEPENDENT_AMBULATORY_CARE_PROVIDER_SITE_OTHER): Payer: Medicare Other | Admitting: Internal Medicine

## 2022-10-13 ENCOUNTER — Encounter: Payer: Self-pay | Admitting: Internal Medicine

## 2022-10-13 VITALS — BP 144/89 | HR 90 | Temp 97.8°F | Wt 204.2 lb

## 2022-10-13 DIAGNOSIS — J4551 Severe persistent asthma with (acute) exacerbation: Secondary | ICD-10-CM | POA: Diagnosis not present

## 2022-10-13 MED ORDER — AMOXICILLIN-POT CLAVULANATE 875-125 MG PO TABS: 1.0000 | ORAL_TABLET | Freq: Two times a day (BID) | ORAL | 0 refills | Status: AC

## 2022-10-13 MED ORDER — PREDNISONE 10 MG (21) PO TBPK: ORAL_TABLET | ORAL | 0 refills | Status: DC

## 2022-10-13 NOTE — Progress Notes (Signed)
Established Patient Office Visit     CC/Reason for Visit: Dyspnea  HPI: Laura Mcdonald is a 67 y.o. female who is coming in today for the above mentioned reasons.  I have not seen her in over a year.  At that time she was treated for pneumonia.  At some point she was diagnosed with asthma but has never had formal PFTs.    Has been using albuterol inhaler every 4-6 hours for the last week without improvement in dyspnea.  This has coincided with postnasal drip.  She does not have any other URI symptoms, believes the spring allergies might be acting up.   Past Medical/Surgical History: Past Medical History:  Diagnosis Date   Allergy    Cancer ([place])    cervical anc rectal   GERD (gastroesophageal reflux disease)    past hx but better wirg weight loss    History of kidney stones    seen on a scan no treatment    Mild intermittent asthma    seasonal or allery induced    Past Surgical History:  Procedure Laterality Date   BIOPSY  01/15/2020   Procedure: BIOPSY;  Surgeon: Rush Landmark Telford Nab., MD;  Location: [facility] ENDOSCOPY;  Service: Gastroenterology;;   cervical cancer surgery  1995~   Dr Stann Mainland; "WHIP surgery"   COLONOSCOPY WITH PROPOFOL N/A 01/15/2020   Procedure: COLONOSCOPY WITH PROPOFOL;  Surgeon: Irving Copas., MD;  Location: [place];  Service: Gastroenterology;  Laterality: N/A;   COLOSTOMY REVERSAL N/A 04/26/2020   Procedure: ILEOSTOMY REVERSAL;  Surgeon: Leighton Ruff, MD;  Location: [facility];  Service: General;  Laterality: N/A;   HAND SURGERY Right    SUBMUCOSAL LIFTING INJECTION  01/15/2020   Procedure: SUBMUCOSAL LIFTING INJECTION;  Surgeon: Irving Copas., MD;  Location: [place];  Service: Gastroenterology;;   tubal preganancy     XI ROBOTIC ASSISTED LOWER ANTERIOR RESECTION N/A 02/15/2020   Procedure: ROBOTIC LOW ANTERIOR RESECTION, DIVERTING ILEOSTOMY;  Surgeon: Leighton Ruff, MD;  Location: [facility];  Service: General;  Laterality:  N/A;    Social History:  reports that she quit smoking about 39 years ago. Her smoking use included cigarettes. She has a 5.00 pack-year smoking history. She has never used smokeless tobacco. She reports that she does not drink alcohol and does not use drugs.  Allergies: Allergies  Allergen Reactions   Hydrocodone Itching    With anxiety    Family History:  Family History  Problem Relation Age of Onset   CVA Mother    Lung cancer Father    Colon polyps Sister    Colon cancer Sister    Diverticulitis Sister    Cancer Brother 9       Prostate cancer    Esophageal cancer Daughter    Stomach cancer Neg Hx    Rectal cancer Neg Hx    Pancreatic cancer Neg Hx    Liver disease Neg Hx      Current Outpatient Medications:    albuterol (VENTOLIN HFA) 108 (90 Base) MCG/ACT inhaler, USE 2 INHALATIONS BY MOUTH EVERY 6 HOURS AS NEEDED WHEEZING AND  SHORTNESS OF BREATH, Disp: 20.1 g, Rfl: 3   amoxicillin-clavulanate (AUGMENTIN) 875-125 MG tablet, Take 1 tablet by mouth 2 (two) times daily for 7 days., Disp: 14 tablet, Rfl: 0   APPLE CIDER VINEGAR PO, Take 1 tablet by mouth daily. gummies a day, Disp: , Rfl:    BLACK ELDERBERRY PO, Take 500 mg by mouth daily., Disp: ,  Rfl:    loratadine (CLARITIN) 10 MG tablet, Take 10 mg by mouth daily., Disp: , Rfl:    Multiple Vitamin (MULTIVITAMIN WITH MINERALS) TABS tablet, Take 1 tablet by mouth daily., Disp: , Rfl:    predniSONE (STERAPRED UNI-PAK 21 TAB) 10 MG (21) TBPK tablet, Take as directed, Disp: 21 tablet, Rfl: 0  Current Facility-Administered Medications:    0.9 %  sodium chloride infusion, 500 mL, Intravenous, Once, Nandigam, Kavitha V, MD  Review of Systems:  Negative unless indicated in HPI.   Physical Exam: Vitals:   10/13/22 1520 10/13/22 1524  BP: (!) 140/90 (!) 144/89  Pulse: 90   Temp: 97.8 F (36.6 C)   TempSrc: Oral   SpO2: 98%   Weight: 204 lb 3.2 oz (92.6 kg)     Body mass index is 32.96 kg/m.   Physical  Exam Vitals reviewed.  Constitutional:      Appearance: Normal appearance.  HENT:     Head: Normocephalic and atraumatic.  Eyes:     Conjunctiva/sclera: Conjunctivae normal.     Pupils: Pupils are equal, round, and reactive to light.  Cardiovascular:     Rate and Rhythm: Normal rate and regular rhythm.  Pulmonary:     Breath sounds: Decreased air movement present. Examination of the right-upper field reveals wheezing and rhonchi. Examination of the left-upper field reveals wheezing and rhonchi. Examination of the right-middle field reveals wheezing and rhonchi. Examination of the left-middle field reveals wheezing and rhonchi. Examination of the right-lower field reveals wheezing and rhonchi. Examination of the left-lower field reveals wheezing and rhonchi. Wheezing and rhonchi present.  Skin:    General: Skin is warm and dry.  Neurological:     General: No focal deficit present.     Mental Status: She is alert and oriented to person, place, and time.  Psychiatric:        Mood and Affect: Mood normal.        Behavior: Behavior normal.        Thought Content: Thought content normal.        Judgment: Judgment normal.      Impression and Plan:  Severe persistent asthma with exacerbation - Plan: predniSONE (STERAPRED UNI-PAK 21 TAB) 10 MG (21) TBPK tablet, amoxicillin-clavulanate (AUGMENTIN) 875-125 MG tablet, Ambulatory referral to Pulmonology  -She appears to have an asthma exacerbation. -Continue albuterol, start prednisone taper, antibiotics. -She has never been formally diagnosed with asthma, has never had PFTs.  Referral to pulmonology today.  Time spent:33 minutes reviewing chart, interviewing and examining patient and formulating plan of care.     Lelon Frohlich, MD [facility]

## 2022-10-29 ENCOUNTER — Ambulatory Visit: Payer: Medicare Other | Admitting: Pulmonary Disease

## 2022-10-29 ENCOUNTER — Encounter: Payer: Self-pay | Admitting: Pulmonary Disease

## 2022-10-29 VITALS — BP 128/84 | HR 58 | Ht 66.0 in | Wt 203.0 lb

## 2022-10-29 DIAGNOSIS — J302 Other seasonal allergic rhinitis: Secondary | ICD-10-CM

## 2022-10-29 DIAGNOSIS — J4530 Mild persistent asthma, uncomplicated: Secondary | ICD-10-CM

## 2022-10-29 MED ORDER — FLUTICASONE-SALMETEROL 115-21 MCG/ACT IN AERO: 2.0000 | INHALATION_SPRAY | Freq: Two times a day (BID) | RESPIRATORY_TRACT | 12 refills | Status: DC

## 2022-10-29 NOTE — Progress Notes (Signed)
Synopsis: Referred in March 2024 for Asthma by Lelon Frohlich, MD  Subjective:   PATIENT ID: Laura Mcdonald: female DOB: [birth year], MRN: [id number]  HPI  Chief Complaint  Patient presents with   Consult    Referred by PCP for asthma. States she has never been tested for asthma, was just told she had asthma a few years ago. Had a flare up about 2 weeks ago.    Laura Mcdonald is a 67 year old woman, former smoker with history of GERD and asthma who is referred to pulmonary clinic for asthma evaluation.   She was treated with prednisone and augmentin 10/13/22 for asthma exacerbation by her primary care team. She feels better with less chest pressure and headaches. She has sinus congestion and drainage. She has intermittent GERD that can aggravate her breathing too. She sleeps with a wedge pillow but can roll off this at times.  Treated for pneumonia January 2023. She reports being diagnosed with asthma 17-18 years ago due to allergies. She has been managed with albuterol and OTC allergy medicine in the past. She reports having 2-3 flares per year in her breathing, but did not always get treated with prednisone.   Former smoker, quit in 1985, 5 pack year history. Her younger sister has asthma. She works in elder care.  Her breathing is worse when she decorates for holidays as she gets the decorations down from the attic, likely dust. Cold air bothers her breathing. Has a pet dog at home. There is a cat at her work.   Past Medical History:  Diagnosis Date   Allergy    Cancer ([place])    cervical anc rectal   GERD (gastroesophageal reflux disease)    past hx but better wirg weight loss    History of kidney stones    seen on a scan no treatment    Mild intermittent asthma    seasonal or allery induced     Family History  Problem Relation Age of Onset   CVA Mother    Lung cancer Father    Colon polyps Sister    Colon cancer Sister    Diverticulitis Sister    Cancer Brother  44       Prostate cancer    Esophageal cancer Daughter    Stomach cancer Neg Hx    Rectal cancer Neg Hx    Pancreatic cancer Neg Hx    Liver disease Neg Hx      Social History   Socioeconomic History   Marital status: Married    Spouse name: Not on file   Number of children: 5   Years of education: 14   Highest education level: Associate degree: academic program  Occupational History   Occupation: health care aid   Tobacco Use   Smoking status: Former    Packs/day: 0.25    Years: 20.00    Additional pack years: 0.00    Total pack years: 5.00    Types: Cigarettes    Quit date: 08/11/1983    Years since quitting: 39.2   Smokeless tobacco: Never  Vaping Use   Vaping Use: Never used  Substance and Sexual Activity   Alcohol use: Never   Drug use: Never   Sexual activity: Not Currently    Birth control/protection: None, Post-menopausal  Other Topics Concern   Not on file  Social History Narrative   Not on file   Social Determinants of Health   Financial Resource Strain: Medium Risk (  07/22/2021)   Overall Financial Resource Strain (CARDIA)    Difficulty of Paying Living Expenses: Somewhat hard  Food Insecurity: Food Insecurity Present (07/22/2021)   Hunger Vital Sign    Worried About Running Out of Food in the Last Year: Sometimes true    Ran Out of Food in the Last Year: Never true  Transportation Needs: No Transportation Needs (07/22/2021)   PRAPARE - Hydrologist (Medical): No    Lack of Transportation (Non-Medical): No  Physical Activity: Insufficiently Active (07/22/2021)   Exercise Vital Sign    Days of Exercise per Week: 2 days    Minutes of Exercise per Session: 30 min  Stress: Stress Concern Present (07/22/2021)   [place]    Feeling of Stress : To some extent  Social Connections: Socially Integrated (07/22/2021)   Social Connection and Isolation Panel [NHANES]     Frequency of Communication with Friends and Family: More than three times a week    Frequency of Social Gatherings with Friends and Family: More than three times a week    Attends Religious Services: 1 to 4 times per year    Active Member of [employer] or Organizations: Yes    Attends Archivist Meetings: 1 to 4 times per year    Marital Status: Married  Human resources officer Violence: Not on file     Allergies  Allergen Reactions   Hydrocodone Itching    With anxiety     Outpatient Medications Prior to Visit  Medication Sig Dispense Refill   albuterol (VENTOLIN HFA) 108 (90 Base) MCG/ACT inhaler USE 2 INHALATIONS BY MOUTH EVERY 6 HOURS AS NEEDED WHEEZING AND  SHORTNESS OF BREATH 20.1 g 3   BLACK ELDERBERRY PO Take 500 mg by mouth daily.     loratadine (CLARITIN) 10 MG tablet Take 10 mg by mouth daily.     Multiple Vitamin (MULTIVITAMIN WITH MINERALS) TABS tablet Take 1 tablet by mouth daily.     APPLE CIDER VINEGAR PO Take 1 tablet by mouth daily. gummies a day     predniSONE (STERAPRED UNI-PAK 21 TAB) 10 MG (21) TBPK tablet Take as directed 21 tablet 0   Facility-Administered Medications Prior to Visit  Medication Dose Route Frequency Provider Last Rate Last Admin   0.9 %  sodium chloride infusion  500 mL Intravenous Once Nandigam, Venia Minks, MD       Review of Systems  Constitutional:  Negative for chills, fever, malaise/fatigue and weight loss.  HENT:  Positive for congestion. Negative for sinus pain and sore throat.   Eyes: Negative.   Respiratory:  Positive for cough, sputum production, shortness of breath and wheezing. Negative for hemoptysis.   Cardiovascular:  Negative for chest pain, palpitations, orthopnea, claudication and leg swelling.  Gastrointestinal:  Negative for abdominal pain, heartburn, nausea and vomiting.  Genitourinary: Negative.   Musculoskeletal:  Negative for joint pain and myalgias.  Skin:  Negative for rash.  Neurological:  Positive for  headaches. Negative for weakness.  Endo/Heme/Allergies:  Positive for environmental allergies.  Psychiatric/Behavioral: Negative.     Objective:   Vitals:   10/29/22 0827  BP: 128/84  Pulse: (!) 58  SpO2: 100%  Weight: 203 lb (92.1 kg)  Height: 5\' 6"  (1.676 m)   Physical Exam Constitutional:      General: She is not in acute distress.    Appearance: She is not ill-appearing.  HENT:     Head: Normocephalic  and atraumatic.  Eyes:     General: No scleral icterus.    Conjunctiva/sclera: Conjunctivae normal.     Pupils: Pupils are equal, round, and reactive to light.  Cardiovascular:     Rate and Rhythm: Normal rate and regular rhythm.     Pulses: Normal pulses.     Heart sounds: Normal heart sounds. No murmur heard. Pulmonary:     Effort: Pulmonary effort is normal.     Breath sounds: Normal breath sounds. No wheezing, rhonchi or rales.  Abdominal:     General: Bowel sounds are normal.     Palpations: Abdomen is soft.  Musculoskeletal:     Right lower leg: No edema.     Left lower leg: No edema.  Lymphadenopathy:     Cervical: No cervical adenopathy.  Skin:    General: Skin is warm and dry.  Neurological:     General: No focal deficit present.     Mental Status: She is alert.  Psychiatric:        Mood and Affect: Mood normal.        Behavior: Behavior normal.        Thought Content: Thought content normal.        Judgment: Judgment normal.    CBC    Component Value Date/Time   WBC 7.4 05/27/2021 0746   RBC 4.86 05/27/2021 0746   HGB 13.8 05/27/2021 0746   HCT 42.2 05/27/2021 0746   PLT 294.0 05/27/2021 0746   MCV 86.9 05/27/2021 0746   MCH 28.8 04/28/2020 0424   MCHC 32.7 05/27/2021 0746   RDW 13.6 05/27/2021 0746   LYMPHSABS 2.2 05/27/2021 0746   MONOABS 0.4 05/27/2021 0746   EOSABS 0.8 (H) 05/27/2021 0746   BASOSABS 0.1 05/27/2021 0746      Latest Ref Rng & Units 05/27/2021    7:46 AM 04/28/2020    4:24 AM 04/27/2020    3:54 AM  BMP  Glucose 70 -  99 mg/dL 97  107  137   BUN 6 - 23 mg/dL 13  9  11    Creatinine 0.40 - 1.20 mg/dL 0.85  0.72  0.92   Sodium 135 - 145 mEq/L 140  140  139   Potassium 3.5 - 5.1 mEq/L 4.1  3.8  4.0   Chloride 96 - 112 mEq/L 108  109  109   CO2 19 - 32 mEq/L 24  23  20    Calcium 8.4 - 10.5 mg/dL 10.1  9.3  9.5    Chest imaging: CXR 08/25/21 Lateral view degraded by patient arm position. Midline trachea. Normal heart size and mediastinal contours. No pleural effusion or pneumothorax. Significantly improved, nearly resolved right upper lobe airspace disease. The left lower lobe airspace disease has resolved. No new pulmonary opacity.  CT Chest 07/22/21 Cardiovascular: No significant vascular findings. Normal heart size. No pericardial effusion.   Mediastinum/Nodes: There are newly enlarged mediastinal and bilateral hilar lymph nodes, largest pretracheal node measuring 1.9 x 1.4 cm (series 2, image 17). Thyroid gland, trachea, and esophagus demonstrate no significant findings.   Lungs/Pleura: Multiple areas of somewhat geographic ground-glass airspace opacity, predominantly in the upper lobes (series 4, image 35, 61). Diffuse bilateral bronchial wall thickening. Stable, benign 0.5 cm nodule of the anterior right upper lobe (series 4, image 44). No pleural effusion or pneumothorax.  PFT:     No data to display          Labs: Abs eos 800 05/27/21  Path:  Echo:  Heart Catheterization:  Assessment & Plan:   Mild persistent asthma without complication - Plan: fluticasone-salmeterol (ADVAIR HFA) 115-21 MCG/ACT inhaler, Pulmonary Function Test  Seasonal allergies  Discussion: Laura Mcdonald is a 67 year old woman, former smoker with history of GERD and asthma who is referred to pulmonary clinic for asthma evaluation.   She appears to have mild persistent asthma. We will start her on advair HFA 115-38mcg 2 puffs twice daily and can continue albuterol inhaler as needed.   If she continues to  have symptoms we will add montelukast 10mg  daily.   Follow up in 2 months with pulmonary function tests.  Freda Jackson, MD [facility] Office: [phone]   Current Outpatient Medications:    albuterol (VENTOLIN HFA) 108 (90 Base) MCG/ACT inhaler, USE 2 INHALATIONS BY MOUTH EVERY 6 HOURS AS NEEDED WHEEZING AND  SHORTNESS OF BREATH, Disp: 20.1 g, Rfl: 3   BLACK ELDERBERRY PO, Take 500 mg by mouth daily., Disp: , Rfl:    fluticasone-salmeterol (ADVAIR HFA) 115-21 MCG/ACT inhaler, Inhale 2 puffs into the lungs 2 (two) times daily., Disp: 1 each, Rfl: 12   loratadine (CLARITIN) 10 MG tablet, Take 10 mg by mouth daily., Disp: , Rfl:    Multiple Vitamin (MULTIVITAMIN WITH MINERALS) TABS tablet, Take 1 tablet by mouth daily., Disp: , Rfl:   Current Facility-Administered Medications:    0.9 %  sodium chloride infusion, 500 mL, Intravenous, Once, Nandigam, Venia Minks, MD

## 2022-10-29 NOTE — Patient Instructions (Signed)
Start advair HFA inhaler 2 puffs twice daily - rinse mouth out after each use  Continue to use albuterol inhaler 1-2 puffs every 4-6 hours  Follow up in 2 months with pulmonary function tests

## 2023-01-08 ENCOUNTER — Ambulatory Visit (INDEPENDENT_AMBULATORY_CARE_PROVIDER_SITE_OTHER): Payer: Medicare Other | Admitting: Pulmonary Disease

## 2023-01-08 ENCOUNTER — Encounter: Payer: Self-pay | Admitting: Pulmonary Disease

## 2023-01-08 ENCOUNTER — Ambulatory Visit: Payer: Medicare Other | Admitting: Pulmonary Disease

## 2023-01-08 VITALS — BP 128/80 | HR 77 | Ht 65.5 in | Wt 203.4 lb

## 2023-01-08 DIAGNOSIS — J4530 Mild persistent asthma, uncomplicated: Secondary | ICD-10-CM | POA: Diagnosis not present

## 2023-01-08 DIAGNOSIS — J4541 Moderate persistent asthma with (acute) exacerbation: Secondary | ICD-10-CM

## 2023-01-08 LAB — PULMONARY FUNCTION TEST
DL/VA % pred: 106 %
DL/VA: 4.37 ml/min/mmHg/L
DLCO cor % pred: 61 %
DLCO cor: 12.64 ml/min/mmHg
DLCO unc % pred: 61 %
DLCO unc: 12.64 ml/min/mmHg
FEF 25-75 Post: 0.62 L/sec
FEF 25-75 Pre: 0.49 L/sec
FEF2575-%Change-Post: 25 %
FEF2575-%Pred-Post: 30 %
FEF2575-%Pred-Pre: 24 %
FEV1-%Change-Post: 6 %
FEV1-%Pred-Post: 40 %
FEV1-%Pred-Pre: 37 %
FEV1-Post: 0.98 L
FEV1-Pre: 0.92 L
FEV1FVC-%Change-Post: 6 %
FEV1FVC-%Pred-Pre: 86 %
FEV6-%Change-Post: 1 %
FEV6-%Pred-Post: 45 %
FEV6-%Pred-Pre: 44 %
FEV6-Post: 1.4 L
FEV6-Pre: 1.38 L
FEV6FVC-%Change-Post: 1 %
FEV6FVC-%Pred-Post: 104 %
FEV6FVC-%Pred-Pre: 102 %
FVC-%Change-Post: 0 %
FVC-%Pred-Post: 43 %
FVC-%Pred-Pre: 43 %
FVC-Post: 1.4 L
FVC-Pre: 1.4 L
Post FEV1/FVC ratio: 70 %
Post FEV6/FVC ratio: 100 %
Pre FEV1/FVC ratio: 66 %
Pre FEV6/FVC Ratio: 99 %
RV % pred: 127 %
RV: 2.86 L
TLC % pred: 82 %
TLC: 4.38 L

## 2023-01-08 MED ORDER — PREDNISONE 10 MG PO TABS: ORAL_TABLET | ORAL | 0 refills | Status: AC

## 2023-01-08 MED ORDER — AMOXICILLIN-POT CLAVULANATE 875-125 MG PO TABS
1.0000 | ORAL_TABLET | Freq: Two times a day (BID) | ORAL | 0 refills | Status: DC
Start: 2023-01-08 — End: 2023-03-23

## 2023-01-08 MED ORDER — MONTELUKAST SODIUM 10 MG PO TABS: 10.0000 mg | ORAL_TABLET | Freq: Every day | ORAL | 11 refills | Status: DC

## 2023-01-08 MED ORDER — FLUTICASONE-SALMETEROL 230-21 MCG/ACT IN AERO
2.0000 | INHALATION_SPRAY | Freq: Two times a day (BID) | RESPIRATORY_TRACT | 12 refills | Status: DC
Start: 2023-01-08 — End: 2023-03-23

## 2023-01-08 NOTE — Progress Notes (Signed)
Full PFT completed today ? ?

## 2023-01-08 NOTE — Patient Instructions (Addendum)
We will treat you for asthma exacerbation  Start steroid taper: 40mg  daily x 3 days 30mg  daily x 3 days 20mg  daily x 3 days 10mg  daily x 3 days  Start montelukast 10mg  daily at bedtime  Continue claritin and fluticasone nasal spray  Start advair HFA 230-69mcg 2 puffs twice dialy with spacer  Use albuterol inhaler 1-2 puffs every 4-6 hours as needed  Start augmentin antibiotic for sinus infection  Follow up in 3 months, call sooner if needed

## 2023-01-08 NOTE — Progress Notes (Unsigned)
Synopsis: Referred in March 2024 for Asthma by Chaya Jan, MD  Subjective:   PATIENT ID: Laura Mcdonald GENDER: female DOB: 16-Nov-1955, MRN: [id number]  HPI  Chief Complaint  Patient presents with   Follow-up    F/U after PFT. States she has been struggling for the past month. Increased productive coughing with clear phlegm and wheezing. Denies any fever.    Laura Mcdonald is a 67 year old woman, former smoker with history of GERD and asthma who is referred to pulmonary clinic for asthma evaluation.   Initial OV 10/29/22 She was treated with prednisone and augmentin 10/13/22 for asthma exacerbation by her primary care team. She feels better with less chest pressure and headaches. She has sinus congestion and drainage. She has intermittent GERD that can aggravate her breathing too. She sleeps with a wedge pillow but can roll off this at times.  Treated for pneumonia January 2023. She reports being diagnosed with asthma 17-18 years ago due to allergies. She has been managed with albuterol and OTC allergy medicine in the past. She reports having 2-3 flares per year in her breathing, but did not always get treated with prednisone.   Former smoker, quit in 1985, 5 pack year history. Her younger sister has asthma. She works in elder care.  Her breathing is worse when she decorates for holidays as she gets the decorations down from the attic, likely dust. Cold air bothers her breathing. Has a pet dog at home. There is a cat at her work.   Today - OV 01/08/23  She was started on advair 115-43mcg 2 puffs twice daily at last visit.   PFTs show severe obstruction with air trapping and mild diffusion defect.  She started having symptoms 5/21 which started with sinus pressure, ear fullness, cough with mucous production, had 2 days of blood tinged sputum and post nasal drip. She denies fevers or chills.    Past Medical History:  Diagnosis Date   Allergy    Cancer ([facility])    cervical anc  rectal   GERD (gastroesophageal reflux disease)    past hx but better wirg weight loss    History of kidney stones    seen on a scan no treatment    Mild intermittent asthma    seasonal or allery induced     Family History  Problem Relation Age of Onset   CVA Mother    Lung cancer Father    Colon polyps Sister    Colon cancer Sister    Diverticulitis Sister    Cancer Brother 53       Prostate cancer    Esophageal cancer Daughter    Stomach cancer Neg Hx    Rectal cancer Neg Hx    Pancreatic cancer Neg Hx    Liver disease Neg Hx      Social History   Socioeconomic History   Marital status: Married    Spouse name: Not on file   Number of children: 5   Years of education: 14   Highest education level: Associate degree: academic program  Occupational History   Occupation: health care aid   Tobacco Use   Smoking status: Former    Packs/day: 0.25    Years: 20.00    Additional pack years: 0.00    Total pack years: 5.00    Types: Cigarettes    Quit date: 08/11/1983    Years since quitting: 39.4   Smokeless tobacco: Never  Vaping Use   Vaping  Use: Never used  Substance and Sexual Activity   Alcohol use: Never   Drug use: Never   Sexual activity: Not Currently    Birth control/protection: None, Post-menopausal  Other Topics Concern   Not on file  Social History Narrative   Not on file   Social Determinants of Health   Financial Resource Strain: Medium Risk (07/22/2021)   Overall Financial Resource Strain (CARDIA)    Difficulty of Paying Living Expenses: Somewhat hard  Food Insecurity: Food Insecurity Present (07/22/2021)   Hunger Vital Sign    Worried About Running Out of Food in the Last Year: Sometimes true    Ran Out of Food in the Last Year: Never true  Transportation Needs: No Transportation Needs (07/22/2021)   PRAPARE - Administrator, Civil Service (Medical): No    Lack of Transportation (Non-Medical): No  Physical Activity: Insufficiently  Active (07/22/2021)   Exercise Vital Sign    Days of Exercise per Week: 2 days    Minutes of Exercise per Session: 30 min  Stress: Stress Concern Present (07/22/2021)   [employer] of Occupational Health - Occupational Stress Questionnaire    Feeling of Stress : To some extent  Social Connections: Socially Integrated (07/22/2021)   Social Connection and Isolation Panel [NHANES]    Frequency of Communication with Friends and Family: More than three times a week    Frequency of Social Gatherings with Friends and Family: More than three times a week    Attends Religious Services: 1 to 4 times per year    Active Member of [employer] or Organizations: Yes    Attends Banker Meetings: 1 to 4 times per year    Marital Status: Married  Catering manager Violence: Not on file     Allergies  Allergen Reactions   Hydrocodone Itching    With anxiety     Outpatient Medications Prior to Visit  Medication Sig Dispense Refill   albuterol (VENTOLIN HFA) 108 (90 Base) MCG/ACT inhaler USE 2 INHALATIONS BY MOUTH EVERY 6 HOURS AS NEEDED WHEEZING AND  SHORTNESS OF BREATH 20.1 g 3   BLACK ELDERBERRY PO Take 500 mg by mouth daily.     loratadine (CLARITIN) 10 MG tablet Take 10 mg by mouth daily.     Multiple Vitamin (MULTIVITAMIN WITH MINERALS) TABS tablet Take 1 tablet by mouth daily.     fluticasone-salmeterol (ADVAIR HFA) 115-21 MCG/ACT inhaler Inhale 2 puffs into the lungs 2 (two) times daily. 1 each 12   Facility-Administered Medications Prior to Visit  Medication Dose Route Frequency Provider Last Rate Last Admin   0.9 %  sodium chloride infusion  500 mL Intravenous Once Nandigam, Eleonore Chiquito, MD       Review of Systems  Constitutional:  Negative for chills, fever, malaise/fatigue and weight loss.  HENT:  Positive for congestion. Negative for sinus pain and sore throat.   Eyes: Negative.   Respiratory:  Positive for cough, sputum production, shortness of breath and wheezing. Negative  for hemoptysis.   Cardiovascular:  Negative for chest pain, palpitations, orthopnea, claudication and leg swelling.  Gastrointestinal:  Negative for abdominal pain, heartburn, nausea and vomiting.  Genitourinary: Negative.   Musculoskeletal:  Negative for joint pain and myalgias.  Skin:  Negative for rash.  Neurological:  Positive for headaches. Negative for weakness.  Endo/Heme/Allergies:  Positive for environmental allergies.  Psychiatric/Behavioral: Negative.     Objective:   Vitals:   01/08/23 1018  BP: 128/80  Pulse: 77  SpO2: 98%  Weight: 203 lb 6.4 oz (92.3 kg)  Height: 5' 5.5" (1.664 m)   Physical Exam Constitutional:      General: She is not in acute distress.    Appearance: She is not ill-appearing.  HENT:     Head: Normocephalic and atraumatic.  Eyes:     General: No scleral icterus.    Conjunctiva/sclera: Conjunctivae normal.  Cardiovascular:     Rate and Rhythm: Normal rate and regular rhythm.     Pulses: Normal pulses.     Heart sounds: Normal heart sounds. No murmur heard. Pulmonary:     Effort: Pulmonary effort is normal.     Breath sounds: Wheezing present. No rhonchi or rales.  Musculoskeletal:     Right lower leg: No edema.     Left lower leg: No edema.  Skin:    General: Skin is warm and dry.  Neurological:     General: No focal deficit present.     Mental Status: She is alert.    CBC    Component Value Date/Time   WBC 7.4 05/27/2021 0746   RBC 4.86 05/27/2021 0746   HGB 13.8 05/27/2021 0746   HCT 42.2 05/27/2021 0746   PLT 294.0 05/27/2021 0746   MCV 86.9 05/27/2021 0746   MCH 28.8 04/28/2020 0424   MCHC 32.7 05/27/2021 0746   RDW 13.6 05/27/2021 0746   LYMPHSABS 2.2 05/27/2021 0746   MONOABS 0.4 05/27/2021 0746   EOSABS 0.8 (H) 05/27/2021 0746   BASOSABS 0.1 05/27/2021 0746      Latest Ref Rng & Units 05/27/2021    7:46 AM 04/28/2020    4:24 AM 04/27/2020    3:54 AM  BMP  Glucose 70 - 99 mg/dL 97  308  657   BUN 6 - 23 mg/dL 13   9  11    Creatinine 0.40 - 1.20 mg/dL 8.46  9.62  9.52   Sodium 135 - 145 mEq/L 140  140  139   Potassium 3.5 - 5.1 mEq/L 4.1  3.8  4.0   Chloride 96 - 112 mEq/L 108  109  109   CO2 19 - 32 mEq/L 24  23  20    Calcium 8.4 - 10.5 mg/dL 84.1  9.3  9.5    Chest imaging: CXR 08/25/21 Lateral view degraded by patient arm position. Midline trachea. Normal heart size and mediastinal contours. No pleural effusion or pneumothorax. Significantly improved, nearly resolved right upper lobe airspace disease. The left lower lobe airspace disease has resolved. No new pulmonary opacity.  CT Chest 07/22/21 Cardiovascular: No significant vascular findings. Normal heart size. No pericardial effusion.   Mediastinum/Nodes: There are newly enlarged mediastinal and bilateral hilar lymph nodes, largest pretracheal node measuring 1.9 x 1.4 cm (series 2, image 17). Thyroid gland, trachea, and esophagus demonstrate no significant findings.   Lungs/Pleura: Multiple areas of somewhat geographic ground-glass airspace opacity, predominantly in the upper lobes (series 4, image 35, 61). Diffuse bilateral bronchial wall thickening. Stable, benign 0.5 cm nodule of the anterior right upper lobe (series 4, image 44). No pleural effusion or pneumothorax.  PFT:    Latest Ref Rng & Units 01/08/2023    8:21 AM  PFT Results  FVC-Pre L 1.40  P  FVC-Predicted Pre % 43  P  FVC-Post L 1.40  P  FVC-Predicted Post % 43  P  Pre FEV1/FVC % % 66  P  Post FEV1/FCV % % 70  P  FEV1-Pre L 0.92  P  FEV1-Predicted  Pre % 37  P  FEV1-Post L 0.98  P  DLCO uncorrected ml/min/mmHg 12.64  P  DLCO UNC% % 61  P  DLCO corrected ml/min/mmHg 12.64  P  DLCO COR %Predicted % 61  P  DLVA Predicted % 106  P  TLC L 4.38  P  TLC % Predicted % 82  P  RV % Predicted % 127  P    P Preliminary result    Labs: Abs eos 800 05/27/21  Path:  Echo:  Heart Catheterization:  Assessment & Plan:   Moderate persistent asthma with  exacerbation - Plan: predniSONE (DELTASONE) 10 MG tablet, montelukast (SINGULAIR) 10 MG tablet, amoxicillin-clavulanate (AUGMENTIN) 875-125 MG tablet, fluticasone-salmeterol (ADVAIR HFA) 230-21 MCG/ACT inhaler  Discussion: Laura Mcdonald is a 67 year old woman, former smoker with history of GERD and asthma who returns to pulmonary clinic for asthma-copd overlap syndrome.  She has asthma-copd overlap syndrome based on PFTs today with moderate obstruction with air trapping and mild diffusion defect.   She is having exacerbation of her asthma/COPD. She is to start prologned steroid taper and augmentin antibiotic for sinus infection.   We will increase her advair to 230-37mcg 2 puffs twice daily from 115-41mcg dosing. She can continue albuterol inhaler as needed. Start montelukast 10mg  daily. Continue claritin and flonase for allergies.   Follow up in 3 months, call sooner if needed.   Melody Comas, MD [facility] Office: [phone]    Current Outpatient Medications:    albuterol (VENTOLIN HFA) 108 (90 Base) MCG/ACT inhaler, USE 2 INHALATIONS BY MOUTH EVERY 6 HOURS AS NEEDED WHEEZING AND  SHORTNESS OF BREATH, Disp: 20.1 g, Rfl: 3   amoxicillin-clavulanate (AUGMENTIN) 875-125 MG tablet, Take 1 tablet by mouth 2 (two) times daily., Disp: 10 tablet, Rfl: 0   BLACK ELDERBERRY PO, Take 500 mg by mouth daily., Disp: , Rfl:    fluticasone-salmeterol (ADVAIR HFA) 230-21 MCG/ACT inhaler, Inhale 2 puffs into the lungs 2 (two) times daily., Disp: 1 each, Rfl: 12   loratadine (CLARITIN) 10 MG tablet, Take 10 mg by mouth daily., Disp: , Rfl:    montelukast (SINGULAIR) 10 MG tablet, Take 1 tablet (10 mg total) by mouth at bedtime., Disp: 30 tablet, Rfl: 11   Multiple Vitamin (MULTIVITAMIN WITH MINERALS) TABS tablet, Take 1 tablet by mouth daily., Disp: , Rfl:    predniSONE (DELTASONE) 10 MG tablet, Take 4 tablets (40 mg total) by mouth daily with breakfast for 3 days, THEN 3 tablets  (30 mg total) daily with breakfast for 3 days, THEN 2 tablets (20 mg total) daily with breakfast for 3 days, THEN 1 tablet (10 mg total) daily with breakfast for 3 days., Disp: 30 tablet, Rfl: 0  Current Facility-Administered Medications:    0.9 %  sodium chloride infusion, 500 mL, Intravenous, Once, Nandigam, Eleonore Chiquito, MD

## 2023-01-10 ENCOUNTER — Encounter: Payer: Self-pay | Admitting: Pulmonary Disease

## 2023-01-21 ENCOUNTER — Telehealth (INDEPENDENT_AMBULATORY_CARE_PROVIDER_SITE_OTHER): Payer: Medicare Other | Admitting: Family Medicine

## 2023-01-21 ENCOUNTER — Encounter: Payer: Self-pay | Admitting: Family Medicine

## 2023-01-21 VITALS — Temp 100.5°F

## 2023-01-21 DIAGNOSIS — U071 COVID-19: Secondary | ICD-10-CM

## 2023-01-21 MED ORDER — MOLNUPIRAVIR EUA 200MG CAPSULE
4.0000 | ORAL_CAPSULE | Freq: Two times a day (BID) | ORAL | 0 refills | Status: AC
Start: 2023-01-21 — End: 2023-01-26

## 2023-01-21 NOTE — Progress Notes (Signed)
Virtual Visit via Video Note  I connected with Laura Mcdonald on 01/21/23 at  4:45 PM EDT by a video enabled telemedicine application and verified that I am speaking with the correct person using two identifiers.  Location patient: home Location provider:work or home office Persons participating in the virtual visit: patient, provider  I discussed the limitations of evaluation and management by telemedicine and the availability of in person appointments. The patient expressed understanding and agreed to proceed. Chief Complaint  Patient presents with   Covid Positive    Tested last night and this morning. Symptoms started Wednesday, woke up and thought it was allergies, throat got scratchy  so took test. Then took another this morning. Both came out positive. Temp last night 99.5 from ear, took tylenol , this morning 100.5     HPI: Pt is a 67 yo female who is followed by Dr. Ardyth Harps and seen for acute concern.  Patient started feeling sick 1 day ago with allergy symptoms.  Took a COVID test that night and this morning which were both positive.  Pt with scratchy throat, fever, chills, HA, nasal congestion, productive cough.Taking tylenol for fever, Tmax 100.80F has not tried anything else for symptoms.  ROS: See pertinent positives and negatives per HPI.  Past Medical History:  Diagnosis Date   Allergy    Cancer (HCC)    cervical anc rectal   GERD (gastroesophageal reflux disease)    past hx but better wirg weight loss    History of kidney stones    seen on a scan no treatment    Mild intermittent asthma    seasonal or allery induced    Past Surgical History:  Procedure Laterality Date   BIOPSY  01/15/2020   Procedure: BIOPSY;  Surgeon: Meridee Score Netty Starring., MD;  Location: [facility] ENDOSCOPY;  Service: Gastroenterology;;   cervical cancer surgery  1995~   Dr Aldona Bar; "WHIP surgery"   COLONOSCOPY WITH PROPOFOL N/A 01/15/2020   Procedure: COLONOSCOPY WITH PROPOFOL;  Surgeon: Lemar Lofty., MD;  Location: [facility] ENDOSCOPY;  Service: Gastroenterology;  Laterality: N/A;   COLOSTOMY REVERSAL N/A 04/26/2020   Procedure: ILEOSTOMY REVERSAL;  Surgeon: Romie Levee, MD;  Location: [facility] ORS;  Service: General;  Laterality: N/A;   HAND SURGERY Right    SUBMUCOSAL LIFTING INJECTION  01/15/2020   Procedure: SUBMUCOSAL LIFTING INJECTION;  Surgeon: Lemar Lofty., MD;  Location: [facility] ENDOSCOPY;  Service: Gastroenterology;;   tubal preganancy     XI ROBOTIC ASSISTED LOWER ANTERIOR RESECTION N/A 02/15/2020   Procedure: ROBOTIC LOW ANTERIOR RESECTION, DIVERTING ILEOSTOMY;  Surgeon: Romie Levee, MD;  Location: [facility] ORS;  Service: General;  Laterality: N/A;    Family History  Problem Relation Age of Onset   CVA Mother    Lung cancer Father    Colon polyps Sister    Colon cancer Sister    Diverticulitis Sister    Cancer Brother 35       Prostate cancer    Esophageal cancer Daughter    Stomach cancer Neg Hx    Rectal cancer Neg Hx    Pancreatic cancer Neg Hx    Liver disease Neg Hx      Current Outpatient Medications:    amoxicillin-clavulanate (AUGMENTIN) 875-125 MG tablet, Take 1 tablet by mouth 2 (two) times daily., Disp: 10 tablet, Rfl: 0   BLACK ELDERBERRY PO, Take 500 mg by mouth daily., Disp: , Rfl:    fluticasone-salmeterol (ADVAIR HFA) 115-21 MCG/ACT inhaler, Inhale 2 puffs into the  lungs 2 (two) times daily., Disp: , Rfl:    fluticasone-salmeterol (ADVAIR HFA) 230-21 MCG/ACT inhaler, Inhale 2 puffs into the lungs 2 (two) times daily., Disp: 1 each, Rfl: 12   loratadine (CLARITIN) 10 MG tablet, Take 10 mg by mouth daily., Disp: , Rfl:    montelukast (SINGULAIR) 10 MG tablet, Take 1 tablet (10 mg total) by mouth at bedtime., Disp: 30 tablet, Rfl: 11   Multiple Vitamin (MULTIVITAMIN WITH MINERALS) TABS tablet, Take 1 tablet by mouth daily., Disp: , Rfl:    albuterol (VENTOLIN HFA) 108 (90 Base) MCG/ACT inhaler, USE 2 INHALATIONS BY MOUTH EVERY 6 HOURS AS NEEDED  WHEEZING AND  SHORTNESS OF BREATH (Patient not taking: Reported on 01/21/2023), Disp: 20.1 g, Rfl: 3  Current Facility-Administered Medications:    0.9 %  sodium chloride infusion, 500 mL, Intravenous, Once, Nandigam, Kavitha V, MD  EXAM:  VITALS per patient if applicable: RR between 12-20 bpm  GENERAL: alert, oriented, appears well and in no acute distress  HEENT: atraumatic, conjunctiva clear, no obvious abnormalities on inspection of external nose and ears  NECK: normal movements of the head and neck  LUNGS: Productive cough.  On inspection no signs of respiratory distress, breathing rate appears normal, no obvious gross SOB, gasping or wheezing  CV: no obvious cyanosis  MS: moves all visible extremities without noticeable abnormality  PSYCH/NEURO: pleasant and cooperative, no obvious depression or anxiety, speech and thought processing grossly intact  ASSESSMENT AND PLAN:  Discussed the following assessment and plan:  COVID-19 virus infection - Plan: molnupiravir EUA (LAGEVRIO) 200 mg CAPS capsule  Positive at home COVID test x 2.  With symptoms starting 1 day ago 01/20/2023.  Discussed r/b/a of antiviral medication and may no longer be covered by insurance.  Rx for molnupiravir sent to pharmacy.  Patient to continue supportive care including hydration, rest, Tylenol, OTC cough/cold medications, albuterol inhaler.  For worsening/uncontrolled symptoms patient advised to proceed to nearest ED.  Follow-up as needed   I discussed the assessment and treatment plan with the patient. The patient was provided an opportunity to ask questions and all were answered. The patient agreed with the plan and demonstrated an understanding of the instructions.   The patient was advised to call back or seek an in-person evaluation if the symptoms worsen or if the condition fails to improve as anticipated.   Deeann Saint, MD

## 2023-02-23 ENCOUNTER — Other Ambulatory Visit: Payer: Self-pay | Admitting: Internal Medicine

## 2023-02-23 DIAGNOSIS — J4520 Mild intermittent asthma, uncomplicated: Secondary | ICD-10-CM

## 2023-03-16 ENCOUNTER — Other Ambulatory Visit: Payer: Self-pay | Admitting: Internal Medicine

## 2023-03-16 DIAGNOSIS — Z1231 Encounter for screening mammogram for malignant neoplasm of breast: Secondary | ICD-10-CM

## 2023-03-23 ENCOUNTER — Encounter: Payer: Self-pay | Admitting: Pulmonary Disease

## 2023-03-23 ENCOUNTER — Ambulatory Visit: Payer: Medicare Other | Admitting: Pulmonary Disease

## 2023-03-23 VITALS — BP 134/82 | HR 75 | Temp 97.3°F | Ht 65.5 in | Wt 200.4 lb

## 2023-03-23 DIAGNOSIS — J4541 Moderate persistent asthma with (acute) exacerbation: Secondary | ICD-10-CM

## 2023-03-23 NOTE — Progress Notes (Signed)
Synopsis: Referred in March 2024 for Asthma by Chaya Jan, MD  Subjective:   PATIENT ID: Laura Mcdonald GENDER: female DOB: 10-10-1955, MRN: [id number]  HPI  Chief Complaint  Patient presents with   Follow-up    Breathing has been good  ACT:24   Laura Mcdonald is a 67 year old woman, former smoker with history of GERD and asthma who is referred to pulmonary clinic for asthma evaluation.   Initial OV 10/29/22 She was treated with prednisone and augmentin 10/13/22 for asthma exacerbation by her primary care team. She feels better with less chest pressure and headaches. She has sinus congestion and drainage. She has intermittent GERD that can aggravate her breathing too. She sleeps with a wedge pillow but can roll off this at times.  Treated for pneumonia January 2023. She reports being diagnosed with asthma 17-18 years ago due to allergies. She has been managed with albuterol and OTC allergy medicine in the past. She reports having 2-3 flares per year in her breathing, but did not always get treated with prednisone.   Former smoker, quit in 1985, 5 pack year history. Her younger sister has asthma. She works in elder care.  Her breathing is worse when she decorates for holidays as she gets the decorations down from the attic, likely dust. Cold air bothers her breathing. Has a pet dog at home. There is a cat at her work.   OV 01/08/23 She was started on advair 115-82mcg 2 puffs twice daily at last visit.   PFTs show severe obstruction with air trapping and mild diffusion defect.  She started having symptoms 5/21 which started with sinus pressure, ear fullness, cough with mucous production, had 2 days of blood tinged sputum and post nasal drip. She denies fevers or chills.   Today - 03/23/23 Patient has been doing well since last visit. She denies much need for albuterol as needed.  Past Medical History:  Diagnosis Date   Allergy    Cancer (HCC)    cervical anc rectal   GERD  (gastroesophageal reflux disease)    past hx but better wirg weight loss    History of kidney stones    seen on a scan no treatment    Mild intermittent asthma    seasonal or allery induced     Family History  Problem Relation Age of Onset   CVA Mother    Lung cancer Father    Colon polyps Sister    Colon cancer Sister    Diverticulitis Sister    Cancer Brother 43       Prostate cancer    Esophageal cancer Daughter    Stomach cancer Neg Hx    Rectal cancer Neg Hx    Pancreatic cancer Neg Hx    Liver disease Neg Hx      Social History   Socioeconomic History   Marital status: Married    Spouse name: Not on file   Number of children: 5   Years of education: 14   Highest education level: Associate degree: academic program  Occupational History   Occupation: health care aid   Tobacco Use   Smoking status: Former    Current packs/day: 0.00    Average packs/day: 0.3 packs/day for 20.0 years (5.0 ttl pk-yrs)    Types: Cigarettes    Start date: 08/11/1963    Quit date: 08/11/1983    Years since quitting: 39.6   Smokeless tobacco: Never  Vaping Use   Vaping status:  Never Used  Substance and Sexual Activity   Alcohol use: Never   Drug use: Never   Sexual activity: Not Currently    Birth control/protection: None, Post-menopausal  Other Topics Concern   Not on file  Social History Narrative   Not on file   Social Determinants of Health   Financial Resource Strain: Medium Risk (07/22/2021)   Overall Financial Resource Strain (CARDIA)    Difficulty of Paying Living Expenses: Somewhat hard  Food Insecurity: Food Insecurity Present (07/22/2021)   Hunger Vital Sign    Worried About Running Out of Food in the Last Year: Sometimes true    Ran Out of Food in the Last Year: Never true  Transportation Needs: No Transportation Needs (07/22/2021)   PRAPARE - Administrator, Civil Service (Medical): No    Lack of Transportation (Non-Medical): No  Physical Activity:  Insufficiently Active (07/22/2021)   Exercise Vital Sign    Days of Exercise per Week: 2 days    Minutes of Exercise per Session: 30 min  Stress: Stress Concern Present (07/22/2021)   [employer] of Occupational Health - Occupational Stress Questionnaire    Feeling of Stress : To some extent  Social Connections: Socially Integrated (07/22/2021)   Social Connection and Isolation Panel [NHANES]    Frequency of Communication with Friends and Family: More than three times a week    Frequency of Social Gatherings with Friends and Family: More than three times a week    Attends Religious Services: 1 to 4 times per year    Active Member of [employer] or Organizations: Yes    Attends Banker Meetings: 1 to 4 times per year    Marital Status: Married  Catering manager Violence: Not on file     Allergies  Allergen Reactions   Hydrocodone Itching    With anxiety     Outpatient Medications Prior to Visit  Medication Sig Dispense Refill   albuterol (VENTOLIN HFA) 108 (90 Base) MCG/ACT inhaler USE 2 INHALATIONS BY MOUTH EVERY 6 HOURS AS NEEDED FOR WHEEZING  AND SHORTNESS OF BREATH 26.8 g 2   BLACK ELDERBERRY PO Take 500 mg by mouth daily.     fluticasone-salmeterol (ADVAIR HFA) 115-21 MCG/ACT inhaler Inhale 2 puffs into the lungs 2 (two) times daily.     loratadine (CLARITIN) 10 MG tablet Take 10 mg by mouth daily.     montelukast (SINGULAIR) 10 MG tablet Take 1 tablet (10 mg total) by mouth at bedtime. 30 tablet 11   Multiple Vitamin (MULTIVITAMIN WITH MINERALS) TABS tablet Take 1 tablet by mouth daily.     amoxicillin-clavulanate (AUGMENTIN) 875-125 MG tablet Take 1 tablet by mouth 2 (two) times daily. 10 tablet 0   fluticasone-salmeterol (ADVAIR HFA) 230-21 MCG/ACT inhaler Inhale 2 puffs into the lungs 2 (two) times daily. 1 each 12   Facility-Administered Medications Prior to Visit  Medication Dose Route Frequency Provider Last Rate Last Admin   0.9 %  sodium chloride  infusion  500 mL Intravenous Once Nandigam, Eleonore Chiquito, MD       Review of Systems  Constitutional:  Negative for chills, fever, malaise/fatigue and weight loss.  HENT:  Negative for congestion, sinus pain and sore throat.   Eyes: Negative.   Respiratory:  Negative for cough, hemoptysis, sputum production, shortness of breath and wheezing.   Cardiovascular:  Negative for chest pain, palpitations, orthopnea, claudication and leg swelling.  Gastrointestinal:  Negative for abdominal pain, heartburn, nausea and vomiting.  Genitourinary:  Negative.   Musculoskeletal:  Negative for joint pain and myalgias.  Skin:  Negative for rash.  Neurological:  Negative for weakness and headaches.  Endo/Heme/Allergies:  Positive for environmental allergies.  Psychiatric/Behavioral: Negative.     Objective:   Vitals:   03/23/23 0837  BP: 134/82  Pulse: 75  Temp: (!) 97.3 F (36.3 C)  TempSrc: Temporal  SpO2: 97%  Weight: 200 lb 6.4 oz (90.9 kg)  Height: 5' 5.5" (1.664 m)    Physical Exam Constitutional:      General: She is not in acute distress.    Appearance: She is not ill-appearing.  HENT:     Head: Normocephalic and atraumatic.  Eyes:     General: No scleral icterus.    Conjunctiva/sclera: Conjunctivae normal.  Cardiovascular:     Rate and Rhythm: Normal rate and regular rhythm.     Pulses: Normal pulses.     Heart sounds: Normal heart sounds. No murmur heard. Pulmonary:     Effort: Pulmonary effort is normal.     Breath sounds: No wheezing, rhonchi or rales.  Musculoskeletal:     Right lower leg: No edema.     Left lower leg: No edema.  Skin:    General: Skin is warm and dry.  Neurological:     Mental Status: She is alert.    CBC    Component Value Date/Time   WBC 7.4 05/27/2021 0746   RBC 4.86 05/27/2021 0746   HGB 13.8 05/27/2021 0746   HCT 42.2 05/27/2021 0746   PLT 294.0 05/27/2021 0746   MCV 86.9 05/27/2021 0746   MCH 28.8 04/28/2020 0424   MCHC 32.7 05/27/2021  0746   RDW 13.6 05/27/2021 0746   LYMPHSABS 2.2 05/27/2021 0746   MONOABS 0.4 05/27/2021 0746   EOSABS 0.8 (H) 05/27/2021 0746   BASOSABS 0.1 05/27/2021 0746      Latest Ref Rng & Units 05/27/2021    7:46 AM 04/28/2020    4:24 AM 04/27/2020    3:54 AM  BMP  Glucose 70 - 99 mg/dL 97  161  096   BUN 6 - 23 mg/dL 13  9  11    Creatinine 0.40 - 1.20 mg/dL 0.45  4.09  8.11   Sodium 135 - 145 mEq/L 140  140  139   Potassium 3.5 - 5.1 mEq/L 4.1  3.8  4.0   Chloride 96 - 112 mEq/L 108  109  109   CO2 19 - 32 mEq/L 24  23  20    Calcium 8.4 - 10.5 mg/dL 91.4  9.3  9.5    Chest imaging: CXR 08/25/21 Lateral view degraded by patient arm position. Midline trachea. Normal heart size and mediastinal contours. No pleural effusion or pneumothorax. Significantly improved, nearly resolved right upper lobe airspace disease. The left lower lobe airspace disease has resolved. No new pulmonary opacity.  CT Chest 07/22/21 Cardiovascular: No significant vascular findings. Normal heart size. No pericardial effusion.   Mediastinum/Nodes: There are newly enlarged mediastinal and bilateral hilar lymph nodes, largest pretracheal node measuring 1.9 x 1.4 cm (series 2, image 17). Thyroid gland, trachea, and esophagus demonstrate no significant findings.   Lungs/Pleura: Multiple areas of somewhat geographic ground-glass airspace opacity, predominantly in the upper lobes (series 4, image 35, 61). Diffuse bilateral bronchial wall thickening. Stable, benign 0.5 cm nodule of the anterior right upper lobe (series 4, image 44). No pleural effusion or pneumothorax.  PFT:    Latest Ref Rng & Units 01/08/2023  8:21 AM  PFT Results  FVC-Pre L 1.40   FVC-Predicted Pre % 43   FVC-Post L 1.40   FVC-Predicted Post % 43   Pre FEV1/FVC % % 66   Post FEV1/FCV % % 70   FEV1-Pre L 0.92   FEV1-Predicted Pre % 37   FEV1-Post L 0.98   DLCO uncorrected ml/min/mmHg 12.64   DLCO UNC% % 61   DLCO corrected  ml/min/mmHg 12.64   DLCO COR %Predicted % 61   DLVA Predicted % 106   TLC L 4.38   TLC % Predicted % 82   RV % Predicted % 127     Labs: Abs eos 800 05/27/21  Path:  Echo:  Heart Catheterization:  Assessment & Plan:   Moderate persistent asthma with exacerbation  Discussion: Laura Mcdonald is a 67 year old woman, former smoker with history of GERD and asthma who returns to pulmonary clinic for asthma-copd overlap syndrome.  PFTs with moderate obstruction with air trapping and mild diffusion defect.   She has been well controlled since last visit using advair hfa 115-53mcg 2 puffs twice daily. She can use albuterol inhaler as needed. Continue montelukast 10mg  daily. Continue claritin and flonase for allergies.   Follow up in 6 months.  Laura Comas, MD [facility] Office: [phone]    Current Outpatient Medications:    albuterol (VENTOLIN HFA) 108 (90 Base) MCG/ACT inhaler, USE 2 INHALATIONS BY MOUTH EVERY 6 HOURS AS NEEDED FOR WHEEZING  AND SHORTNESS OF BREATH, Disp: 26.8 g, Rfl: 2   BLACK ELDERBERRY PO, Take 500 mg by mouth daily., Disp: , Rfl:    fluticasone-salmeterol (ADVAIR HFA) 115-21 MCG/ACT inhaler, Inhale 2 puffs into the lungs 2 (two) times daily., Disp: , Rfl:    loratadine (CLARITIN) 10 MG tablet, Take 10 mg by mouth daily., Disp: , Rfl:    montelukast (SINGULAIR) 10 MG tablet, Take 1 tablet (10 mg total) by mouth at bedtime., Disp: 30 tablet, Rfl: 11   Multiple Vitamin (MULTIVITAMIN WITH MINERALS) TABS tablet, Take 1 tablet by mouth daily., Disp: , Rfl:   Current Facility-Administered Medications:    0.9 %  sodium chloride infusion, 500 mL, Intravenous, Once, Nandigam, Eleonore Chiquito, MD

## 2023-03-23 NOTE — Patient Instructions (Addendum)
Continue advair HFA inhaler 115-53mcg 2 puffs twice daily - rinse mouth out after each use  Continue montelukast 10mg  daily  Use albuterol inhaler 1-2 puffs every 4-6 hours as needed  Please message [place] if you have increased use of your albuterol and we will increase the dose of your advair inhaler  Follow up in 6 months

## 2023-03-29 ENCOUNTER — Encounter: Payer: Self-pay | Admitting: Pulmonary Disease

## 2023-05-08 ENCOUNTER — Ambulatory Visit
Admission: RE | Admit: 2023-05-08 | Discharge: 2023-05-08 | Disposition: A | Discharge status: Home / Self Care | Payer: Medicare Other | Source: Ambulatory Visit | Attending: Internal Medicine | Admitting: Internal Medicine

## 2023-05-08 DIAGNOSIS — Z1231 Encounter for screening mammogram for malignant neoplasm of breast: Secondary | ICD-10-CM | POA: Diagnosis not present

## 2023-05-08 LAB — HM MAMMOGRAPHY

## 2023-05-31 ENCOUNTER — Telehealth: Payer: Self-pay

## 2023-05-31 NOTE — Telephone Encounter (Signed)
Unsuccessful attempts to reach patient on preferred number listed in notes for scheduled AWV. Left message on voicemail ok to reschedule.

## 2023-08-25 ENCOUNTER — Ambulatory Visit: Payer: Medicare Other

## 2023-08-25 ENCOUNTER — Encounter: Payer: Medicare Other | Admitting: Internal Medicine

## 2023-08-26 ENCOUNTER — Telehealth: Payer: Self-pay

## 2023-08-26 NOTE — Telephone Encounter (Signed)
Contacted patient on preferred number listed in notes for scheduled AWV. Due to computer issues unable to complete visit. Please reschedule.

## 2023-09-07 ENCOUNTER — Encounter: Payer: Self-pay | Admitting: Internal Medicine

## 2023-09-07 ENCOUNTER — Ambulatory Visit: Payer: Medicare Other | Admitting: Internal Medicine

## 2023-09-07 VITALS — BP 130/84 | HR 64 | Temp 97.8°F | Ht 66.0 in | Wt 202.7 lb

## 2023-09-07 DIAGNOSIS — Z23 Encounter for immunization: Secondary | ICD-10-CM

## 2023-09-07 DIAGNOSIS — C539 Malignant neoplasm of cervix uteri, unspecified: Secondary | ICD-10-CM

## 2023-09-07 DIAGNOSIS — Z780 Asymptomatic menopausal state: Secondary | ICD-10-CM

## 2023-09-07 DIAGNOSIS — J4520 Mild intermittent asthma, uncomplicated: Secondary | ICD-10-CM | POA: Diagnosis not present

## 2023-09-07 DIAGNOSIS — Z0000 Encounter for general adult medical examination without abnormal findings: Secondary | ICD-10-CM

## 2023-09-07 DIAGNOSIS — Z1159 Encounter for screening for other viral diseases: Secondary | ICD-10-CM

## 2023-09-07 DIAGNOSIS — E559 Vitamin D deficiency, unspecified: Secondary | ICD-10-CM | POA: Diagnosis not present

## 2023-09-07 LAB — COMPREHENSIVE METABOLIC PANEL
ALT: 17 U/L (ref 0–35)
AST: 19 U/L (ref 0–37)
Albumin: 4.7 g/dL (ref 3.5–5.2)
Alkaline Phosphatase: 67 U/L (ref 39–117)
BUN: 14 mg/dL (ref 6–23)
CO2: 26 meq/L (ref 19–32)
Calcium: 10.6 mg/dL — ABNORMAL HIGH (ref 8.4–10.5)
Chloride: 108 meq/L (ref 96–112)
Creatinine, Ser: 0.93 mg/dL (ref 0.40–1.20)
GFR: 62.58 mL/min (ref >60.00)
Glucose, Bld: 102 mg/dL — ABNORMAL HIGH (ref 70–99)
Potassium: 4.7 meq/L (ref 3.5–5.1)
Sodium: 141 meq/L (ref 135–145)
Total Bilirubin: 0.5 mg/dL (ref 0.2–1.2)
Total Protein: 7.2 g/dL (ref 6.0–8.3)

## 2023-09-07 LAB — CBC WITH DIFFERENTIAL/PLATELET
Basophils Absolute: 0 10*3/uL (ref 0.0–0.1)
Basophils Relative: 0.7 % (ref 0.0–3.0)
Eosinophils Absolute: 0.6 10*3/uL (ref 0.0–0.7)
Eosinophils Relative: 8 % — ABNORMAL HIGH (ref 0.0–5.0)
HCT: 42.3 % (ref 36.0–46.0)
Hemoglobin: 14 g/dL (ref 12.0–15.0)
Lymphocytes Relative: 25.9 % (ref 12.0–46.0)
Lymphs Abs: 1.9 10*3/uL (ref 0.7–4.0)
MCHC: 33 g/dL (ref 30.0–36.0)
MCV: 88.5 fL (ref 78.0–100.0)
Monocytes Absolute: 0.4 10*3/uL (ref 0.1–1.0)
Monocytes Relative: 5.9 % (ref 3.0–12.0)
Neutro Abs: 4.3 10*3/uL (ref 1.4–7.7)
Neutrophils Relative %: 59.5 % (ref 43.0–77.0)
Platelets: 309 10*3/uL (ref 150.0–400.0)
RBC: 4.78 Mil/uL (ref 3.87–5.11)
RDW: 13.5 % (ref 11.5–15.5)
WBC: 7.2 10*3/uL (ref 4.0–10.5)

## 2023-09-07 LAB — LIPID PANEL
Cholesterol: 202 mg/dL — ABNORMAL HIGH (ref 0–200)
HDL: 65.3 mg/dL (ref >39.00)
LDL Cholesterol: 115 mg/dL — ABNORMAL HIGH (ref 0–99)
NonHDL: 136.34
Total CHOL/HDL Ratio: 3
Triglycerides: 106 mg/dL (ref 0.0–149.0)
VLDL: 21.2 mg/dL (ref 0.0–40.0)

## 2023-09-07 LAB — VITAMIN B12: Vitamin B-12: 269 pg/mL (ref 211–911)

## 2023-09-07 LAB — TSH: TSH: 1.95 u[IU]/mL (ref 0.35–5.50)

## 2023-09-07 LAB — VITAMIN D 25 HYDROXY (VIT D DEFICIENCY, FRACTURES): VITD: 30.06 ng/mL (ref 30.00–100.00)

## 2023-09-07 NOTE — Patient Instructions (Signed)
-  Nice seeing you today!!  -Lab work today; will notify you once results are available.  -Update: Tdap, shingles, RSV and COVID at the pharmacy.

## 2023-09-07 NOTE — Addendum Note (Signed)
Addended by: Kern Reap B on: 09/07/2023 09:01 AM   Modules accepted: Orders

## 2023-09-07 NOTE — Progress Notes (Signed)
Medicare Wellness question were answered 09/06/23.

## 2023-09-07 NOTE — Progress Notes (Signed)
Established Patient Office Visit     CC/Reason for Visit: Annual preventive exam and subsequent Medicare wellness visit  HPI: Laura Mcdonald is a 68 y.o. female who is coming in today for the above mentioned reasons. Past Medical History is significant for: Mild intermittent asthma.  Has been feeling well without major concerns or complaints.  Is due for flu, COVID, RSV, Tdap, shingles vaccines.  Is due for bone density.  She follows routinely with GYN due to history of cervical cancer.   Past Medical/Surgical History: Past Medical History:  Diagnosis Date   Allergy    Cancer (HCC)    cervical anc rectal   GERD (gastroesophageal reflux disease)    past hx but better wirg weight loss    History of kidney stones    seen on a scan no treatment    Mild intermittent asthma    seasonal or allery induced    Past Surgical History:  Procedure Laterality Date   BIOPSY  01/15/2020   Procedure: BIOPSY;  Surgeon: Meridee Score Netty Starring., MD;  Location: [facility] ENDOSCOPY;  Service: Gastroenterology;;   cervical cancer surgery  1995~   Dr Aldona Bar; "WHIP surgery"   COLONOSCOPY WITH PROPOFOL N/A 01/15/2020   Procedure: COLONOSCOPY WITH PROPOFOL;  Surgeon: Lemar Lofty., MD;  Location: [facility] ENDOSCOPY;  Service: Gastroenterology;  Laterality: N/A;   COLOSTOMY REVERSAL N/A 04/26/2020   Procedure: ILEOSTOMY REVERSAL;  Surgeon: Romie Levee, MD;  Location: [facility] ORS;  Service: General;  Laterality: N/A;   HAND SURGERY Right    SUBMUCOSAL LIFTING INJECTION  01/15/2020   Procedure: SUBMUCOSAL LIFTING INJECTION;  Surgeon: Lemar Lofty., MD;  Location: [facility] ENDOSCOPY;  Service: Gastroenterology;;   tubal preganancy     XI ROBOTIC ASSISTED LOWER ANTERIOR RESECTION N/A 02/15/2020   Procedure: ROBOTIC LOW ANTERIOR RESECTION, DIVERTING ILEOSTOMY;  Surgeon: Romie Levee, MD;  Location: [facility] ORS;  Service: General;  Laterality: N/A;    Social History:  reports that she quit smoking about  40 years ago. Her smoking use included cigarettes. She started smoking about 60 years ago. She has a 5 pack-year smoking history. She has never used smokeless tobacco. She reports that she does not drink alcohol and does not use drugs.  Allergies: Allergies  Allergen Reactions   Hydrocodone Itching    With anxiety    Family History:  Family History  Problem Relation Age of Onset   CVA Mother    Lung cancer Father    Colon polyps Sister    Colon cancer Sister    Diverticulitis Sister    Cancer Brother 31       Prostate cancer    Esophageal cancer Daughter    Stomach cancer Neg Hx    Rectal cancer Neg Hx    Pancreatic cancer Neg Hx    Liver disease Neg Hx      Current Outpatient Medications:    albuterol (VENTOLIN HFA) 108 (90 Base) MCG/ACT inhaler, USE 2 INHALATIONS BY MOUTH EVERY 6 HOURS AS NEEDED FOR WHEEZING  AND SHORTNESS OF BREATH, Disp: 26.8 g, Rfl: 2   BLACK ELDERBERRY PO, Take 500 mg by mouth daily., Disp: , Rfl:    fluticasone-salmeterol (ADVAIR HFA) 115-21 MCG/ACT inhaler, Inhale 2 puffs into the lungs 2 (two) times daily., Disp: , Rfl:    loratadine (CLARITIN) 10 MG tablet, Take 10 mg by mouth daily., Disp: , Rfl:    montelukast (SINGULAIR) 10 MG tablet, Take 1 tablet (10 mg total) by mouth at  bedtime., Disp: 30 tablet, Rfl: 11   Multiple Vitamin (MULTIVITAMIN WITH MINERALS) TABS tablet, Take 1 tablet by mouth daily., Disp: , Rfl:   Current Facility-Administered Medications:    0.9 %  sodium chloride infusion, 500 mL, Intravenous, Once, Nandigam, Kavitha V, MD  Review of Systems:  Negative unless indicated in HPI.   Physical Exam: Vitals:   09/07/23 0759  BP: 130/84  Pulse: 64  Temp: 97.8 F (36.6 C)  TempSrc: Oral  SpO2: 98%  Weight: 202 lb 11.2 oz (91.9 kg)  Height: 5\' 6"  (1.676 m)    Body mass index is 32.72 kg/m.   Physical Exam Vitals reviewed.  Constitutional:      General: She is not in acute distress.    Appearance: Normal appearance.  She is not ill-appearing, toxic-appearing or diaphoretic.  HENT:     Head: Normocephalic.     Right Ear: Tympanic membrane, ear canal and external ear normal. There is no impacted cerumen.     Left Ear: Tympanic membrane, ear canal and external ear normal. There is no impacted cerumen.     Nose: Nose normal.     Mouth/Throat:     Mouth: Mucous membranes are moist.     Pharynx: Oropharynx is clear. No oropharyngeal exudate or posterior oropharyngeal erythema.  Eyes:     General: No scleral icterus.       Right eye: No discharge.        Left eye: No discharge.     Conjunctiva/sclera: Conjunctivae normal.     Pupils: Pupils are equal, round, and reactive to light.  Neck:     Vascular: No carotid bruit.  Cardiovascular:     Rate and Rhythm: Normal rate and regular rhythm.     Pulses: Normal pulses.     Heart sounds: Normal heart sounds.  Pulmonary:     Effort: Pulmonary effort is normal. No respiratory distress.     Breath sounds: Normal breath sounds.  Abdominal:     General: Abdomen is flat. Bowel sounds are normal.     Palpations: Abdomen is soft.  Musculoskeletal:        General: Normal range of motion.     Cervical back: Normal range of motion.  Skin:    General: Skin is warm and dry.  Neurological:     General: No focal deficit present.     Mental Status: She is alert and oriented to person, place, and time. Mental status is at baseline.  Psychiatric:        Mood and Affect: Mood normal.        Behavior: Behavior normal.        Thought Content: Thought content normal.        Judgment: Judgment normal.   Subsequent Medicare wellness visit   1. Risk factors, based on past  M,S,F - Cardiac Risk Factors include: advanced age (>53men, >35 women);hypertension   2.  Physical activities: Dietary issues and exercise activities discussed:      3.  Depression/mood:  Flowsheet Row Clinical Support from 09/07/2023 in [facility] Total  Score 0        4.  ADL's:    09/06/2023    4:27 PM  In your present state of health, do you have any difficulty performing the following activities:  Hearing? 0  Vision? 0  Difficulty concentrating or making decisions? 1  Walking or climbing stairs? 0  Dressing or bathing? 0  Doing errands, shopping?  0  Preparing Food and eating ? N  Using the Toilet? N  In the past six months, have you accidently leaked urine? N  Do you have problems with loss of bowel control? N  Managing your Medications? N  Managing your Finances? N  Housekeeping or managing your Housekeeping? N     5.  Fall risk:     07/29/2020   12:22 PM 05/27/2021    7:04 AM 07/22/2021    4:36 PM 10/13/2022    3:24 PM 09/06/2023    4:29 PM  Fall Risk  Falls in the past year?  0 0 0 0  Was there an injury with Fall?  0  0 0  Fall Risk Category Calculator  0  0 0  Fall Risk Category (Retired)  Low     (RETIRED) Patient Fall Risk Level Low fall risk      Fall risk Follow up    Falls evaluation completed Falls evaluation completed     6.  Home safety: No problems identified   7.  Height weight, and visual acuity: height and weight as above, vision/hearing: Vision Screening   Right eye Left eye Both eyes  Without correction     With correction 20/20 20/20 20/20      8.  Counseling: Counseling given: Not Answered    9. Lab orders based on risk factors: Laboratory update will be reviewed   10. Cognitive assessment:        09/06/2023    4:31 PM  6CIT Screen  What Year? 0 points  What month? 0 points  Count back from 20 0 points  Months in reverse 0 points  Repeat phrase 0 points     11. Screening: Patient provided with a written and personalized 5-10 year screening schedule in the AVS. Health Maintenance  Topic Date Due   Hepatitis C Screening  Never done   DTaP/Tdap/Td vaccine (1 - Tdap) Never done   Zoster (Shingles) Vaccine (1 of 2) Never done   COVID-19 Vaccine (3 - Pfizer risk series)  12/11/2019   Flu Shot  03/11/2023   Colon Cancer Screening  04/21/2024   Medicare Annual Wellness Visit  09/06/2024   Mammogram  05/07/2025   Pneumonia Vaccine  Completed   DEXA scan (bone density measurement)  Completed   HPV Vaccine  Aged Out    12. Provider List Update: Patient Care Team    Relationship Specialty Notifications Start End  Philip Aspen, Limmie Patricia, MD PCP - General Internal Medicine  01/30/20   Mansouraty, Netty Starring., MD Consulting Physician Gastroenterology  01/23/20   Romie Levee, MD Consulting Physician General Surgery  01/23/20      13. Advance Directives: Does Patient Have a Medical Advance Directive?: Yes Type of Advance Directive: Healthcare Power of Attorney, Living will, Out of facility DNR (pink MOST or yellow form) Does patient want to make changes to medical advance directive?: No - Patient declined Copy of Healthcare Power of Attorney in Chart?: No - copy requested  14. Opioids: Patient is not on any opioid prescriptions and has no risk factors for a substance use disorder.   15.   Goals      Stay Active and Independent-Low Back Pain         I have personally reviewed and noted the following in the patient's chart:   Medical and social history Use of alcohol, tobacco or illicit drugs  Current medications and supplements Functional ability and status Nutritional status Physical activity Advanced  directives List of other physicians Hospitalizations, surgeries, and ER visits in previous 12 months Vitals Screenings to include cognitive, depression, and falls Referrals and appointments  In addition, I have reviewed and discussed with patient certain preventive protocols, quality metrics, and best practice recommendations. A written personalized care plan for preventive services as well as general preventive health recommendations were provided to patient.    Impression and Plan:  Medicare annual wellness visit, subsequent  Vitamin  D deficiency -     VITAMIN D 25 Hydroxy (Vit-D Deficiency, Fractures); Future  Mild intermittent asthma without complication -     CBC with Differential/Platelet; Future -     Comprehensive metabolic panel; Future -     Lipid panel; Future -     TSH; Future -     Vitamin B12; Future  Malignant neoplasm of cervix, unspecified site [facility])  Immunization due  Encounter for hepatitis C screening test for low risk patient  Postmenopausal estrogen deficiency -     DG Bone Density; Future   -Recommend routine eye and dental care. -Healthy lifestyle discussed in detail. -Labs to be updated today. -Prostate cancer screening: N/A Health Maintenance  Topic Date Due   Hepatitis C Screening  Never done   DTaP/Tdap/Td vaccine (1 - Tdap) Never done   Zoster (Shingles) Vaccine (1 of 2) Never done   COVID-19 Vaccine (3 - Pfizer risk series) 12/11/2019   Flu Shot  03/11/2023   Colon Cancer Screening  04/21/2024   Medicare Annual Wellness Visit  09/06/2024   Mammogram  05/07/2025   Pneumonia Vaccine  Completed   DEXA scan (bone density measurement)  Completed   HPV Vaccine  Aged Out     -Flu vaccine administered today. -Advised to update Tdap, shingles, COVID and RSV at pharmacy. -Obtain records from GYN.     Chaya Jan, MD [facility]

## 2023-09-15 ENCOUNTER — Encounter: Payer: Self-pay | Admitting: Internal Medicine

## 2023-09-21 ENCOUNTER — Ambulatory Visit (HOSPITAL_BASED_OUTPATIENT_CLINIC_OR_DEPARTMENT_OTHER)
Admission: RE | Admit: 2023-09-21 | Discharge: 2023-09-21 | Disposition: A | Discharge status: Home / Self Care | Payer: No Typology Code available for payment source | Source: Ambulatory Visit | Attending: Internal Medicine | Admitting: Internal Medicine

## 2023-09-21 DIAGNOSIS — Z780 Asymptomatic menopausal state: Secondary | ICD-10-CM | POA: Insufficient documentation

## 2023-10-14 ENCOUNTER — Encounter: Payer: Self-pay | Admitting: Internal Medicine

## 2023-10-18 ENCOUNTER — Telehealth: Payer: Self-pay | Admitting: Pulmonary Disease

## 2023-10-18 DIAGNOSIS — J4541 Moderate persistent asthma with (acute) exacerbation: Secondary | ICD-10-CM

## 2023-10-18 MED ORDER — PREDNISONE 10 MG PO TABS: ORAL_TABLET | ORAL | 0 refills | Status: AC

## 2023-10-18 MED ORDER — AZITHROMYCIN 250 MG PO TABS: ORAL_TABLET | ORAL | 0 refills | Status: DC

## 2023-10-18 MED ORDER — ALBUTEROL SULFATE HFA 108 (90 BASE) MCG/ACT IN AERS
2.0000 | INHALATION_SPRAY | Freq: Four times a day (QID) | RESPIRATORY_TRACT | 6 refills | Status: DC | PRN
Start: 2023-10-18 — End: 2023-11-08

## 2023-10-18 NOTE — Telephone Encounter (Signed)
 She is to use advair 2 puff twice daily - rinse mouth out after each use  I sent in albuterol inhaler to use 1-2 puffs every 4-6 hours as needed for cough, wheezing or shortness of breath  I sent in extended steroid taper and Zpak antibiotic  Hope she feels better.  Dr. Francine Graven

## 2023-10-18 NOTE — Telephone Encounter (Signed)
 Spoke with pt regarding Dr. Cindi Carbon note. Pt verbalized understanding and is going to start using medications that were called in and continue using advair inhaler. Pt doesn't have any concerns. NFN

## 2023-10-18 NOTE — Telephone Encounter (Signed)
 Called and spoke to patient.  Patient reports of nasal congestion, prod cough with yellow to green sputum, wheezing and increased SOB with exertion. Sx developed Saturday. Denied f/c/s or additional sx.  She does not wear supplemental oxygen. She does not monitor oxygen levels. She is using Advair TID. She thought that Advair was to be used as needed. She does not have albuterol.  Neg covid test Saturday and this morning.  Dr. Francine Graven, please advise. Thanks

## 2023-10-18 NOTE — Telephone Encounter (Signed)
 PT writes on [facility]:  Coughing and spittle is dark yellow and green ish and nose congestion.   Please call to advise. She does not want this to get worse. Her # is [phone]

## 2023-11-04 ENCOUNTER — Other Ambulatory Visit: Payer: Self-pay | Admitting: Internal Medicine

## 2023-11-04 DIAGNOSIS — J4541 Moderate persistent asthma with (acute) exacerbation: Secondary | ICD-10-CM

## 2024-01-13 ENCOUNTER — Other Ambulatory Visit: Payer: Self-pay | Admitting: Internal Medicine

## 2024-01-13 DIAGNOSIS — J4541 Moderate persistent asthma with (acute) exacerbation: Secondary | ICD-10-CM

## 2024-01-17 ENCOUNTER — Encounter: Payer: Self-pay | Admitting: Internal Medicine

## 2024-01-19 LAB — HM PAP SMEAR

## 2024-02-14 ENCOUNTER — Encounter: Payer: Self-pay | Admitting: Gastroenterology

## 2024-03-16 ENCOUNTER — Telehealth: Payer: Self-pay | Admitting: *Deleted

## 2024-03-16 NOTE — Telephone Encounter (Signed)
 Tina Night Nurse:   Caller states her throat is swollen and she's congested and feels like she has a head cold. It is hard to swallow and coughing makes it burn.  Coughing up yellow mucous.  I called the patient and offered an office visit, but the patient declined.  She stated that she is feeling better.

## 2024-03-29 ENCOUNTER — Ambulatory Visit: Payer: Self-pay

## 2024-03-29 NOTE — Telephone Encounter (Signed)
 Pt daughter and grandson saw Dr. Johnny on yesterday and both tested positive for strep.

## 2024-03-29 NOTE — Telephone Encounter (Signed)
 noted

## 2024-03-29 NOTE — Telephone Encounter (Signed)
 FYI Only or Action Required?: FYI only for provider.  Patient was last seen in primary care on 09/07/2023 by Theophilus Andrews, Tully GRADE, MD.  Called Nurse Triage reporting Sore Throat.  Symptoms began today.  Interventions attempted: OTC medications: tylenol .  Symptoms are: gradually worsening.  Triage Disposition: Call PCP Within 24 Hours  Patient/caregiver understands and will follow disposition?: Yes    Copied from CRM #[id number]. Topic: Clinical - Red Word Triage >> Mar 29, 2024 10:07 AM [place] J wrote: [facility] that prompted transfer to Nurse Triage: Patient believes she has strep throat. Her daughter and grandson tested posted. Her throat feels like it's burning and her lymph nodes are swollen.  -------------------------------------------------------------------------- From previous Reason for Contact - Scheduling: Patient/patient representative is calling to schedule an appointment. Refer to attachments for appointment information. Reason for Disposition  [1] Strep throat EXPOSURE within past 10 days AND [2] sore throat  Answer Assessment - Initial Assessment Questions 1. STREP EXPOSURE: Was the exposure to someone who lives within your home? If not, ask: How much contact did you have with the sick person?      Yes, daughter and grandson were in the car with her for hours and grandson drank out of her cup 2. ONSET: How many days ago did the contact occur?      4-5 days 3. PROVEN STREP: Are you sure the person with strep had a positive throat culture or rapid strep test?      Yes was tested yesterday at the pcp office 4. STREP SYMPTOMS: Do YOU have a sore throat, fever, or other symptoms suggestive of strep?      Sore throat and swollen lymph nodes 5. VIRAL SYMPTOMS: Are there any symptoms of a cold, such as a runny nose, cough, hoarse voice?     Runny nose and cough, nausea  Protocols used: Strep Throat Exposure-A-AH

## 2024-03-30 ENCOUNTER — Ambulatory Visit: Admitting: Internal Medicine

## 2024-04-11 ENCOUNTER — Ambulatory Visit (AMBULATORY_SURGERY_CENTER): Admitting: *Deleted

## 2024-04-11 ENCOUNTER — Telehealth: Payer: Self-pay | Admitting: Gastroenterology

## 2024-04-11 ENCOUNTER — Other Ambulatory Visit: Payer: Self-pay

## 2024-04-11 ENCOUNTER — Telehealth: Payer: Self-pay

## 2024-04-11 VITALS — Ht 65.5 in | Wt 197.0 lb

## 2024-04-11 DIAGNOSIS — Z860100 Personal history of colon polyps, unspecified: Secondary | ICD-10-CM

## 2024-04-11 DIAGNOSIS — Z85038 Personal history of other malignant neoplasm of large intestine: Secondary | ICD-10-CM

## 2024-04-11 DIAGNOSIS — Z85048 Personal history of other malignant neoplasm of rectum, rectosigmoid junction, and anus: Secondary | ICD-10-CM

## 2024-04-11 MED ORDER — PEG 3350-KCL-NA BICARB-NACL 420 G PO SOLR: 4000.0000 mL | Freq: Once | ORAL | 0 refills | Status: AC

## 2024-04-11 MED ORDER — SUTAB 1479-225-188 MG PO TABS: ORAL_TABLET | ORAL | 0 refills | Status: DC

## 2024-04-11 NOTE — Progress Notes (Signed)
 Pt's name and DOB verified at the beginning of the pre-visit with 2 identifiers  Pt denies any difficulty with ambulating,sitting, laying down or rolling side to side  Pt has no issues moving head neck or swallowing  No egg or soy allergy known to patient   No issues known to pt with past sedation  No FH of Malignant Hyperthermia  Pt is not on home 02   Pt is not on blood thinners   Pt denies issues with constipation   Pt is not on dialysis  Pt denise any abnormal heart rhythms   Pt denies any upcoming cardiac testing  Patient's chart reviewed by Norleen Schillings CNRA prior to pre-visit and patient appropriate for the [facility].  Pre-visit completed and red dot placed by patient's name on their procedure day (on provider's schedule).    Visit by phone  Pt states weight is 197 lb   Pt given  both [facility] main # and MD on call # prior to instructions.  Informed pt to come in at the time discussed and is shown on PV instructions.  Pt instructed to use Singlecare.com or GoodRx for a price reduction on prep  Instructed pt to review instructions again prior to procedure and call main # given if has any questions or any issues. Pt states they will. Instructed pt where to find PV instructions in My Chart  Instructed pt on all aspects of written instructions including med holds clothing to wear and foods to eat and not eat as well as after procedure legal restrictions and to call MD on call if needed.. Pt states understanding.

## 2024-04-11 NOTE — Telephone Encounter (Signed)
 Returned call to patient r/t different colon prep medication needed; no answer; left vm for return call.

## 2024-04-11 NOTE — Telephone Encounter (Signed)
 Inbound call from patient stating that she had her PV this morning and was called in Sutab  for her prep. She states its going to be too expensive and is requesting a call back from nurse to discuss getting a different prep. Please advise.

## 2024-04-11 NOTE — Telephone Encounter (Signed)
 RN attempted again to contact patient regarding the need for a less expensive prep medication. RN left message telling patient that she is going to send an MD order for Golytely to patient pharmacy and instructions for how to take this will be sent to patient's My Chart, which she has recently accessed.  New prep instructions have been sent to patient. New prescription has been sent to pharmacy.

## 2024-04-18 ENCOUNTER — Encounter: Payer: Self-pay | Admitting: Gastroenterology

## 2024-04-27 ENCOUNTER — Telehealth: Payer: Self-pay | Admitting: *Deleted

## 2024-04-27 MED ORDER — PEG 3350-KCL-NA BICARB-NACL 420 G PO SOLR: 4000.0000 mL | Freq: Once | ORAL | 0 refills | Status: AC

## 2024-04-27 NOTE — Telephone Encounter (Signed)
 Patient requesting f/u call in regards to previous note. Please advise.   [phone]

## 2024-04-27 NOTE — Addendum Note (Signed)
 Addended by: MALINDA ORIE CROME on: 04/27/2024 12:39 PM   Modules accepted: Orders

## 2024-04-27 NOTE — Telephone Encounter (Signed)
 Prep medication RX sent to the [facility] pharmacy. Instructed pt to call and verify they received RX and to call back if the have not. No other questions or concerns at this time.

## 2024-04-27 NOTE — Telephone Encounter (Signed)
 Verified by 2 ID's

## 2024-05-04 ENCOUNTER — Telehealth: Payer: Self-pay | Admitting: Gastroenterology

## 2024-05-04 NOTE — Telephone Encounter (Signed)
 Patient paged the on-call.  States that she messed up and accidentally ate a salad this evening for dinner after starting the prep.  Discussed options, but since procedure is scheduled for tomorrow morning and there is a possibility of an incomplete prep, she would like to cancel tomorrow's procedure and reschedule to a future date.  All questions answered and appreciative for the phone call.

## 2024-05-05 ENCOUNTER — Encounter: Payer: Self-pay | Admitting: Gastroenterology

## 2024-05-05 ENCOUNTER — Encounter: Admitting: Gastroenterology

## 2024-05-11 DIAGNOSIS — Z1231 Encounter for screening mammogram for malignant neoplasm of breast: Secondary | ICD-10-CM | POA: Diagnosis not present

## 2024-05-11 LAB — HM MAMMOGRAPHY

## 2024-06-12 ENCOUNTER — Ambulatory Visit (AMBULATORY_SURGERY_CENTER): Admitting: *Deleted

## 2024-06-12 VITALS — Ht 65.5 in | Wt 197.0 lb

## 2024-06-12 DIAGNOSIS — Z1211 Encounter for screening for malignant neoplasm of colon: Secondary | ICD-10-CM

## 2024-06-12 MED ORDER — NA SULFATE-K SULFATE-MG SULF 17.5-3.13-1.6 GM/177ML PO SOLN: 1.0000 | Freq: Once | ORAL | 0 refills | Status: AC

## 2024-06-12 NOTE — Progress Notes (Signed)
 Pt's name and DOB verified at the beginning of the pre-visit with 2 identifiers  Pt denies any difficulty with ambulating,sitting, laying down or rolling side to side  Pt has no issues moving head neck or swallowing  No egg or soy allergy known to patient   No issues known to pt with past sedation  No FH of Malignant Hyperthermia  Pt is not on home 02   Pt is not on blood thinners   Pt denies issues with constipation   Pt is not on dialysis  Pt denise any abnormal heart rhythms   Pt denies any upcoming cardiac testing  Patient's chart reviewed by Norleen Schillings CNRA prior to pre-visit and patient appropriate for the [facility].  Pre-visit completed and red dot placed by patient's name on their procedure day (on provider's schedule).    Visit in person  Pt states weight is 197 lb   Pt given  both [facility] main # and MD on call # prior to instructions.  Informed pt to come in at the time discussed and is shown on PV instructions.  Pt instructed to use Singlecare.com or GoodRx for a price reduction on prep  Instructed pt where to find PV instructions in My Ch. Copy of instructions given to pt Instructed pt on all aspects of written instructions including med holds clothing to wear and foods to eat and not eat as well as after procedure legal restrictions and to call MD on call if needed.. Pt states understanding. Instructed pt to review instructions again prior to procedure and call main # given if has any questions or any issues. Pt states they will.

## 2024-06-20 ENCOUNTER — Encounter: Payer: Self-pay | Admitting: Gastroenterology

## 2024-06-26 ENCOUNTER — Ambulatory Visit: Admitting: Gastroenterology

## 2024-06-26 ENCOUNTER — Encounter: Payer: Self-pay | Admitting: Gastroenterology

## 2024-06-26 VITALS — BP 128/86 | HR 67 | Temp 97.2°F | Resp 14 | Ht 65.5 in | Wt 197.0 lb

## 2024-06-26 DIAGNOSIS — K644 Residual hemorrhoidal skin tags: Secondary | ICD-10-CM

## 2024-06-26 DIAGNOSIS — Z85048 Personal history of other malignant neoplasm of rectum, rectosigmoid junction, and anus: Secondary | ICD-10-CM

## 2024-06-26 DIAGNOSIS — K648 Other hemorrhoids: Secondary | ICD-10-CM

## 2024-06-26 DIAGNOSIS — Z800 Family history of malignant neoplasm of digestive organs: Secondary | ICD-10-CM | POA: Diagnosis not present

## 2024-06-26 DIAGNOSIS — Z860101 Personal history of adenomatous and serrated colon polyps: Secondary | ICD-10-CM | POA: Diagnosis not present

## 2024-06-26 DIAGNOSIS — Z1211 Encounter for screening for malignant neoplasm of colon: Secondary | ICD-10-CM | POA: Diagnosis not present

## 2024-06-26 DIAGNOSIS — D120 Benign neoplasm of cecum: Secondary | ICD-10-CM

## 2024-06-26 DIAGNOSIS — K5730 Diverticulosis of large intestine without perforation or abscess without bleeding: Secondary | ICD-10-CM

## 2024-06-26 MED ORDER — SODIUM CHLORIDE 0.9 % IV SOLN: 500.0000 mL | INTRAVENOUS | Status: DC

## 2024-06-26 NOTE — Progress Notes (Unsigned)
 [facility] History and Physical   Primary Care Physician:  Rox Charleston, MD   Reason for Procedure:  History of adenomatous colon polyps, history of rectal cancer  Plan:    Surveillance colonoscopy with possible interventions as needed     HPI: Laura Mcdonald is a very pleasant 68 y.o. female here for surveillance colonoscopy. Denies any nausea, vomiting, abdominal pain, melena or bright red blood per rectum  The risks and benefits as well as alternatives of endoscopic procedure(s) have been discussed and reviewed.  The patient was provided an opportunity to ask questions and all were answered. The patient agreed with the plan and demonstrated an understanding of the instructions.   Past Medical History:  Diagnosis Date   Allergy    Blood transfusion without reported diagnosis    Cancer (HCC)    cervical anc rectal   Chronic kidney disease    GERD (gastroesophageal reflux disease)    past hx but better wirg weight loss    History of kidney stones    seen on a scan no treatment    Mild intermittent asthma    seasonal or allery induced    Past Surgical History:  Procedure Laterality Date   BIOPSY  01/15/2020   Procedure: BIOPSY;  Surgeon: Wilhelmenia Aloha Raddle., MD;  Location: [facility] ENDOSCOPY;  Service: Gastroenterology;;   cervical cancer surgery  1995~   Dr Rox; [facility] surgery   COLONOSCOPY WITH PROPOFOL  N/A 01/15/2020   Procedure: COLONOSCOPY WITH PROPOFOL ;  Surgeon: Wilhelmenia Aloha Raddle., MD;  Location: [facility] ENDOSCOPY;  Service: Gastroenterology;  Laterality: N/A;   COLOSTOMY REVERSAL N/A 04/26/2020   Procedure: ILEOSTOMY REVERSAL;  Surgeon: Debby Hila, MD;  Location: [facility] ORS;  Service: General;  Laterality: N/A;   HAND SURGERY Right    SUBMUCOSAL LIFTING INJECTION  01/15/2020   Procedure: SUBMUCOSAL LIFTING INJECTION;  Surgeon: Wilhelmenia Aloha Raddle., MD;  Location: [facility] ENDOSCOPY;  Service: Gastroenterology;;   tubal preganancy     XI ROBOTIC  ASSISTED LOWER ANTERIOR RESECTION N/A 02/15/2020   Procedure: ROBOTIC LOW ANTERIOR RESECTION, DIVERTING ILEOSTOMY;  Surgeon: Debby Hila, MD;  Location: [facility] ORS;  Service: General;  Laterality: N/A;    Prior to Admission medications   Medication Sig Start Date End Date Taking? Authorizing Provider  albuterol  (VENTOLIN  HFA) 108 (90 Base) MCG/ACT inhaler USE 2 INHALATIONS BY MOUTH EVERY 6 HOURS AS NEEDED FOR WHEEZING  AND SHORTNESS OF BREATH 01/17/24   Theophilus Andrews, Tully GRADE, MD  azithromycin  (ZITHROMAX ) 250 MG tablet Take as directed 10/18/23   Kara Dorn NOVAK, MD  BLACK ELDERBERRY PO Take 500 mg by mouth daily.    [provider]  cholecalciferol (VITAMIN D3) 25 MCG (1000 UNIT) tablet Take 1,000 Units by mouth daily.    [provider]  fluticasone -salmeterol (ADVAIR HFA) 115-21 MCG/ACT inhaler Inhale 2 puffs into the lungs 2 (two) times daily.    [provider]  loratadine (CLARITIN) 10 MG tablet Take 10 mg by mouth daily.    [provider]  Magnesium 100 MG CAPS Take by mouth daily at 12 noon.    [provider]  montelukast  (SINGULAIR ) 10 MG tablet Take 1 tablet (10 mg total) by mouth at bedtime. 01/08/23   Kara Dorn NOVAK, MD  Multiple Vitamin (MULTIVITAMIN WITH MINERALS) TABS tablet Take 1 tablet by mouth daily.    [provider]  OVER THE COUNTER MEDICATION daily at 12 noon. [place] Patient not taking: Reported on 06/12/2024    [provider]  OVER THE COUNTER MEDICATION daily at 6 (six) AM. Collagen    [provider]    Current Outpatient Medications  Medication Sig Dispense Refill   albuterol  (VENTOLIN  HFA) 108 (90 Base) MCG/ACT inhaler USE 2 INHALATIONS BY MOUTH EVERY 6 HOURS AS NEEDED FOR WHEEZING  AND SHORTNESS OF BREATH 26.8 g 2   azithromycin  (ZITHROMAX ) 250 MG tablet Take as directed 6 tablet 0   BLACK ELDERBERRY PO Take 500 mg by mouth daily.     cholecalciferol (VITAMIN D3) 25 MCG (1000 UNIT)  tablet Take 1,000 Units by mouth daily.     fluticasone -salmeterol (ADVAIR HFA) 115-21 MCG/ACT inhaler Inhale 2 puffs into the lungs 2 (two) times daily.     loratadine (CLARITIN) 10 MG tablet Take 10 mg by mouth daily.     Magnesium 100 MG CAPS Take by mouth daily at 12 noon.     montelukast  (SINGULAIR ) 10 MG tablet Take 1 tablet (10 mg total) by mouth at bedtime. 30 tablet 11   Multiple Vitamin (MULTIVITAMIN WITH MINERALS) TABS tablet Take 1 tablet by mouth daily.     OVER THE COUNTER MEDICATION daily at 12 noon. [place] (Patient not taking: Reported on 06/12/2024)     OVER THE COUNTER MEDICATION daily at 6 (six) AM. Collagen     Current Facility-Administered Medications  Medication Dose Route Frequency Provider Last Rate Last Admin   0.9 %  sodium chloride  infusion  500 mL Intravenous Once Loyde Orth V, MD        Allergies as of 06/26/2024 - Review Complete 06/12/2024  Allergen Reaction Noted   Hydrocodone  Itching 04/04/2021    Family History  Problem Relation Age of Onset   CVA Mother    Lung cancer Father    Colon polyps Sister    Colon cancer Sister    Diverticulitis Sister    Cancer Brother 25       Prostate cancer    Stomach cancer Neg Hx    Rectal cancer Neg Hx    Pancreatic cancer Neg Hx    Liver disease Neg Hx    Esophageal cancer Neg Hx     Social History   Socioeconomic History   Marital status: Married    Spouse name: Not on file   Number of children: 5   Years of education: 14   Highest education level: Associate degree: academic program  Occupational History   Occupation: health care aid   Tobacco Use   Smoking status: Former    Current packs/day: 0.00    Average packs/day: 0.3 packs/day for 20.0 years (5.0 ttl pk-yrs)    Types: Cigarettes    Start date: 08/11/1963    Quit date: 08/11/1983    Years since quitting: 40.9   Smokeless tobacco: Never  Vaping Use   Vaping status: Never Used  Substance and Sexual Activity   Alcohol use: Never    Drug use: Never   Sexual activity: Not Currently    Birth control/protection: None, Post-menopausal  Other Topics Concern   Not on file  Social History Narrative   Not on file   Social Drivers of Health   Financial Resource Strain: Medium Risk (09/06/2023)   Overall Financial Resource Strain (CARDIA)    Difficulty of Paying Living Expenses: Somewhat hard  Food Insecurity: Food Insecurity Present (09/06/2023)   Hunger Vital Sign    Worried About Running Out of Food in the Last Year: Sometimes true    Ran Out of Food in  the Last Year: Sometimes true  Transportation Needs: No Transportation Needs (09/06/2023)   PRAPARE - Administrator, Civil Service (Medical): No    Lack of Transportation (Non-Medical): No  Physical Activity: Insufficiently Active (09/06/2023)   Exercise Vital Sign    Days of Exercise per Week: 2 days    Minutes of Exercise per Session: 30 min  Stress: Stress Concern Present (09/06/2023)   [employer] of Occupational Health - Occupational Stress Questionnaire    Feeling of Stress : To some extent  Social Connections: Moderately Integrated (09/06/2023)   Social Connection and Isolation Panel    Frequency of Communication with Friends and Family: More than three times a week    Frequency of Social Gatherings with Friends and Family: More than three times a week    Attends Religious Services: More than 4 times per year    Active Member of [employer] or Organizations: Yes    Attends Banker Meetings: More than 4 times per year    Marital Status: Separated  Intimate Partner Violence: Not At Risk (09/06/2023)   Humiliation, Afraid, Rape, and Kick questionnaire    Fear of Current or Ex-Partner: No    Emotionally Abused: No    Physically Abused: No    Sexually Abused: No    Review of Systems:  All other review of systems negative except as mentioned in the HPI.  Physical Exam: Vital signs in last 24 hours: There were no vitals taken for this  visit. General:   Alert, NAD Lungs:  Clear .   Heart:  Regular rate and rhythm Abdomen:  Soft, nontender and nondistended. Neuro/Psych:  Alert and cooperative. Normal mood and affect. A and O x 3  Reviewed labs, radiology imaging, old records and pertinent past GI work up  Patient is appropriate for planned procedure(s) and anesthesia in an ambulatory setting   K. Veena Emilliano Dilworth , MD [phone]

## 2024-06-26 NOTE — Progress Notes (Unsigned)
 Pt's states no medical or surgical changes since previsit or office visit.

## 2024-06-26 NOTE — Progress Notes (Unsigned)
 Report given to PACU, vss

## 2024-06-26 NOTE — Op Note (Signed)
 [facility] Patient Name: Laura Mcdonald Procedure Date: 06/26/2024 11:09 AM MRN: [id number] Endoscopist: Gustav ALONSO Mcgee , MD, [id number] Age: 68 Referring MD:  Date of Birth: [birth year] Gender: Female Account #: [id number] Procedure:                Colonoscopy Indications:              High risk colon cancer surveillance: Personal                            history of multiple (3 or more) adenomas, High risk                            colon cancer surveillance: Personal history of                            adenoma less than 10 mm in size, High risk colon                            cancer surveillance: Personal history of colorectal                            cancer Medicines:                Monitored Anesthesia Care Procedure:                Pre-Anesthesia Assessment:                           - Prior to the procedure, a History and Physical                            was performed, and patient medications and                            allergies were reviewed. The patient's tolerance of                            previous anesthesia was also reviewed. The risks                            and benefits of the procedure and the sedation                            options and risks were discussed with the patient.                            All questions were answered, and informed consent                            was obtained. Prior Anticoagulants: The patient has                            taken no anticoagulant or antiplatelet agents. ASA  Grade Assessment: II - A patient with mild systemic                            disease. After reviewing the risks and benefits,                            the patient was deemed in satisfactory condition to                            undergo the procedure.                           After obtaining informed consent, the colonoscope                            was passed under direct vision. Throughout the                             procedure, the patient's blood pressure, pulse, and                            oxygen saturations were monitored continuously. The                            [id number]-HQ190L Colonoscope [id number] was introduced                            through the anus and advanced to the the cecum,                            identified by appendiceal orifice and ileocecal                            valve. The colonoscopy was performed without                            difficulty. The patient tolerated the procedure                            well. The quality of the bowel preparation was                            good. The ileocecal valve, appendiceal orifice, and                            rectum were photographed. Scope In: 11:19:31 AM Scope Out: 11:30:08 AM Scope Withdrawal Time: 0 hours 7 minutes 57 seconds  Total Procedure Duration: 0 hours 10 minutes 37 seconds  Findings:                 The perianal and digital rectal examinations were                            normal.  There was evidence of a prior functional end-to-end                            colo-colonic anastomosis in the recto-sigmoid                            colon. This was patent and was characterized by                            healthy appearing mucosa. The anastomosis was                            traversed.                           A less than 1 mm polyp was found in the cecum. The                            polyp was sessile. The polyp was removed with a                            cold biopsy forceps. Resection and retrieval were                            complete.                           Scattered small-mouthed diverticula were found in                            the descending colon, transverse colon and                            ascending colon.                           Non-bleeding external and internal hemorrhoids were                            found during retroflexion.  The hemorrhoids were                            medium-sized. Complications:            No immediate complications. Estimated Blood Loss:     Estimated blood loss was minimal. Impression:               - Patent functional end-to-end colo-colonic                            anastomosis, characterized by healthy appearing                            mucosa.                           - One less than 1 mm polyp  in the cecum, removed                            with a cold biopsy forceps. Resected and retrieved.                           - Diverticulosis in the descending colon, in the                            transverse colon and in the ascending colon.                           - Non-bleeding external and internal hemorrhoids. Recommendation:           - Resume previous diet.                           - Continue present medications.                           - Await pathology results.                           - Repeat colonoscopy in 5 years for surveillance                            based on pathology results. Tharon Kitch V. Jontez Redfield, MD 06/26/2024 11:37:01 AM This report has been signed electronically.

## 2024-06-26 NOTE — Patient Instructions (Signed)
-  Await pathology results -Handout on polyps, hemorrhoids and diverticulosisprovided  YOU HAD AN ENDOSCOPIC PROCEDURE TODAY AT THE [facility]:   Refer to the procedure report that was given to you for any specific questions about what was found during the examination.  If the procedure report does not answer your questions, please call your gastroenterologist to clarify.  If you requested that your care partner not be given the details of your procedure findings, then the procedure report has been included in a sealed envelope for you to review at your convenience later.  YOU SHOULD EXPECT: Some feelings of bloating in the abdomen. Passage of more gas than usual.  Walking can help get rid of the air that was put into your GI tract during the procedure and reduce the bloating. If you had a lower endoscopy (such as a colonoscopy or flexible sigmoidoscopy) you may notice spotting of blood in your stool or on the toilet paper. If you underwent a bowel prep for your procedure, you may not have a normal bowel movement for a few days.  Please Note:  You might notice some irritation and congestion in your nose or some drainage.  This is from the oxygen used during your procedure.  There is no need for concern and it should clear up in a day or so.  SYMPTOMS TO REPORT IMMEDIATELY:  Following lower endoscopy (colonoscopy or flexible sigmoidoscopy):  Excessive amounts of blood in the stool  Significant tenderness or worsening of abdominal pains  Swelling of the abdomen that is new, acute  Fever of 100F or higher  For urgent or emergent issues, a gastroenterologist can be reached at any hour by calling [phone]. Do not use MyChart messaging for urgent concerns.    DIET:  We do recommend a small meal at first, but then you may proceed to your regular diet.  Drink plenty of fluids but you should avoid alcoholic beverages for 24 hours.  ACTIVITY:  You should plan to take it easy for the  rest of today and you should NOT DRIVE or use heavy machinery until tomorrow (because of the sedation medicines used during the test).    FOLLOW UP: Our staff will call the number listed on your records the next business day following your procedure.  We will call around 7:15- 8:00 am to check on you and address any questions or concerns that you may have regarding the information given to you following your procedure. If we do not reach you, we will leave a message.     If any biopsies were taken you will be contacted by phone or by letter within the next 1-3 weeks.  Please call us  at [phone] if you have not heard about the biopsies in 3 weeks.    SIGNATURES/CONFIDENTIALITY: You and/or your care partner have signed paperwork which will be entered into your electronic medical record.  These signatures attest to the fact that that the information above on your After Visit Summary has been reviewed and is understood.  Full responsibility of the confidentiality of this discharge information lies with you and/or your care-partner.

## 2024-06-27 ENCOUNTER — Telehealth: Payer: Self-pay

## 2024-06-27 NOTE — Telephone Encounter (Signed)
  Follow up Call-     06/26/2024   10:11 AM  Call back number  Post procedure Call Back phone  # [phone]  Permission to leave phone message Yes     Patient questions:  Do you have a fever, pain , or abdominal swelling? No. Pain Score  0 *  Have you tolerated food without any problems? Yes.    Have you been able to return to your normal activities? Yes.    Do you have any questions about your discharge instructions: Diet   No. Medications  No. Follow up visit  No.  Do you have questions or concerns about your Care? No.  Actions: * If pain score is 4 or above: No action needed, pain <4.

## 2024-06-28 LAB — SURGICAL PATHOLOGY

## 2024-07-12 ENCOUNTER — Telehealth: Payer: Self-pay | Admitting: Internal Medicine

## 2024-07-12 NOTE — Telephone Encounter (Signed)
 Copied from CRM #[id number]. Topic: Clinical - Lab/Test Results >> Jul 12, 2024  9:13 AM Robinson H wrote: Reason for CRM: Patient had A1C screening done today by home health nurse and reading was 7.8 second time was 6.7 after she had patient drink water. Home health nurse wanted to report while patient was on the line.  [place] [phone]

## 2024-07-12 NOTE — Telephone Encounter (Signed)
 Appointment scheduled.

## 2024-07-17 ENCOUNTER — Ambulatory Visit: Admitting: Internal Medicine

## 2024-07-17 ENCOUNTER — Encounter: Payer: Self-pay | Admitting: Internal Medicine

## 2024-07-17 VITALS — BP 190/100 | HR 59 | Temp 98.2°F | Wt 205.7 lb

## 2024-07-17 DIAGNOSIS — Z23 Encounter for immunization: Secondary | ICD-10-CM

## 2024-07-17 DIAGNOSIS — R7302 Impaired glucose tolerance (oral): Secondary | ICD-10-CM

## 2024-07-17 DIAGNOSIS — J4520 Mild intermittent asthma, uncomplicated: Secondary | ICD-10-CM | POA: Diagnosis not present

## 2024-07-17 DIAGNOSIS — R030 Elevated blood-pressure reading, without diagnosis of hypertension: Secondary | ICD-10-CM | POA: Diagnosis not present

## 2024-07-17 LAB — POCT GLYCOSYLATED HEMOGLOBIN (HGB A1C): Hemoglobin A1C: 5.5 % (ref 4.0–5.6)

## 2024-07-17 NOTE — Progress Notes (Signed)
 Established Patient Office Visit     CC/Reason for Visit: Follow-up chronic conditions  HPI: Laura Mcdonald is a 68 y.o. female who is coming in today for the above mentioned reasons. Past Medical History is significant for: Asthma, impaired glucose tolerance.  Feeling well, no acute concerns or complaints.   Past Medical/Surgical History: Past Medical History:  Diagnosis Date   Allergy    Blood transfusion without reported diagnosis    Cancer (HCC)    cervical anc rectal   Chronic kidney disease    GERD (gastroesophageal reflux disease)    past hx but better wirg weight loss    History of kidney stones    seen on a scan no treatment    Mild intermittent asthma    seasonal or allery induced    Past Surgical History:  Procedure Laterality Date   BIOPSY  01/15/2020   Procedure: BIOPSY;  Surgeon: Wilhelmenia Aloha Raddle., MD;  Location: [facility] ENDOSCOPY;  Service: Gastroenterology;;   cervical cancer surgery  1995~   Dr Rox; [facility] surgery   COLONOSCOPY WITH PROPOFOL  N/A 01/15/2020   Procedure: COLONOSCOPY WITH PROPOFOL ;  Surgeon: Wilhelmenia Aloha Raddle., MD;  Location: [facility] ENDOSCOPY;  Service: Gastroenterology;  Laterality: N/A;   COLOSTOMY REVERSAL N/A 04/26/2020   Procedure: ILEOSTOMY REVERSAL;  Surgeon: Debby Hila, MD;  Location: [facility] ORS;  Service: General;  Laterality: N/A;   HAND SURGERY Right    SUBMUCOSAL LIFTING INJECTION  01/15/2020   Procedure: SUBMUCOSAL LIFTING INJECTION;  Surgeon: Wilhelmenia Aloha Raddle., MD;  Location: [facility] ENDOSCOPY;  Service: Gastroenterology;;   tubal preganancy     XI ROBOTIC ASSISTED LOWER ANTERIOR RESECTION N/A 02/15/2020   Procedure: ROBOTIC LOW ANTERIOR RESECTION, DIVERTING ILEOSTOMY;  Surgeon: Debby Hila, MD;  Location: [facility] ORS;  Service: General;  Laterality: N/A;    Social History:  reports that she quit smoking about 40 years ago. Her smoking use included cigarettes. She started smoking about 60 years ago. She has a 5  pack-year smoking history. She has never used smokeless tobacco. She reports that she does not drink alcohol and does not use drugs.  Allergies: Allergies  Allergen Reactions   Hydrocodone  Itching    With anxiety    Family History:  Family History  Problem Relation Age of Onset   CVA Mother    Lung cancer Father    Colon polyps Sister    Colon cancer Sister    Diverticulitis Sister    Cancer Brother 32       Prostate cancer    Stomach cancer Neg Hx    Rectal cancer Neg Hx    Pancreatic cancer Neg Hx    Liver disease Neg Hx    Esophageal cancer Neg Hx      Current Outpatient Medications:    albuterol  (VENTOLIN  HFA) 108 (90 Base) MCG/ACT inhaler, USE 2 INHALATIONS BY MOUTH EVERY 6 HOURS AS NEEDED FOR WHEEZING  AND SHORTNESS OF BREATH, Disp: 26.8 g, Rfl: 2   cholecalciferol (VITAMIN D3) 25 MCG (1000 UNIT) tablet, Take 1,000 Units by mouth daily., Disp: , Rfl:    fluticasone -salmeterol (ADVAIR HFA) 115-21 MCG/ACT inhaler, Inhale 2 puffs into the lungs 2 (two) times daily., Disp: , Rfl:    loratadine (CLARITIN) 10 MG tablet, Take 10 mg by mouth daily., Disp: , Rfl:    Magnesium 100 MG CAPS, Take by mouth daily at 12 noon., Disp: , Rfl:    montelukast  (SINGULAIR ) 10 MG tablet, Take 1 tablet (10 mg total)  by mouth at bedtime., Disp: 30 tablet, Rfl: 11   Multiple Vitamin (MULTIVITAMIN WITH MINERALS) TABS tablet, Take 1 tablet by mouth daily., Disp: , Rfl:    BLACK ELDERBERRY PO, Take 500 mg by mouth daily. (Patient not taking: Reported on 07/17/2024), Disp: , Rfl:    OVER THE COUNTER MEDICATION, daily at 6 (six) AM. Collagen, Disp: , Rfl:   Current Facility-Administered Medications:    0.9 %  sodium chloride  infusion, 500 mL, Intravenous, Once, Nandigam, Kavitha V, MD  Review of Systems:  Negative unless indicated in HPI.   Physical Exam: Vitals:   07/17/24 0730 07/17/24 0738 07/17/24 0754  BP: (!) 160/98 (!) 160/90 (!) 190/100  Pulse:  (!) 59   Temp:  98.2 F (36.8 C)    SpO2:  98%   Weight:  205 lb 11.2 oz (93.3 kg)     Body mass index is 33.71 kg/m.   Physical Exam Vitals reviewed.  Constitutional:      Appearance: Normal appearance.  HENT:     Head: Normocephalic and atraumatic.  Eyes:     Conjunctiva/sclera: Conjunctivae normal.  Cardiovascular:     Rate and Rhythm: Normal rate and regular rhythm.  Pulmonary:     Effort: Pulmonary effort is normal.     Breath sounds: Normal breath sounds.  Skin:    General: Skin is warm and dry.  Neurological:     General: No focal deficit present.     Mental Status: She is alert and oriented to person, place, and time.  Psychiatric:        Mood and Affect: Mood normal.        Behavior: Behavior normal.        Thought Content: Thought content normal.        Judgment: Judgment normal.      Impression and Plan:  IGT (impaired glucose tolerance) -     POCT glycosylated hemoglobin (Hb A1C)  Mild intermittent asthma without complication  Immunization due  Elevated BP reading w/ no diagnosis of HTN  - A1c is well-controlled at 5.5.  Continue to work on lifestyle changes. - Flu vaccine administered in office today. - Asthma has been well-controlled on Advair, as needed albuterol , Singulair  and antihistamines. - Blood pressure is noted to be quite elevated today on 2 measurements.  Has never been diagnosed with hypertension.  She will do ambulatory blood pressure measurements and return in 4 weeks for follow-up.   Time spent:31 minutes reviewing chart, interviewing and examining patient and formulating plan of care.     Tully Theophilus Andrews, MD [facility]

## 2024-07-31 ENCOUNTER — Ambulatory Visit: Payer: Self-pay | Admitting: Gastroenterology

## 2024-08-14 ENCOUNTER — Encounter: Payer: Self-pay | Admitting: Internal Medicine

## 2024-08-14 ENCOUNTER — Ambulatory Visit: Admitting: Internal Medicine

## 2024-08-14 VITALS — BP 131/72 | HR 59 | Temp 98.0°F | Wt 198.5 lb

## 2024-08-14 DIAGNOSIS — I10 Essential (primary) hypertension: Secondary | ICD-10-CM

## 2024-08-14 DIAGNOSIS — J4541 Moderate persistent asthma with (acute) exacerbation: Secondary | ICD-10-CM

## 2024-08-14 MED ORDER — AMLODIPINE BESYLATE 5 MG PO TABS: 5.0000 mg | ORAL_TABLET | Freq: Every day | ORAL | 1 refills | Status: AC

## 2024-08-14 MED ORDER — MONTELUKAST SODIUM 10 MG PO TABS: 10.0000 mg | ORAL_TABLET | Freq: Every day | ORAL | 1 refills | Status: AC

## 2024-08-14 MED ORDER — FLUTICASONE-SALMETEROL 115-21 MCG/ACT IN AERO: 2.0000 | INHALATION_SPRAY | Freq: Two times a day (BID) | RESPIRATORY_TRACT | 2 refills | Status: AC

## 2024-08-14 NOTE — Progress Notes (Signed)
 "    Established Patient Office Visit     CC/Reason for Visit: Follow-up blood pressure  HPI: Laura Mcdonald is a 69 y.o. female who is coming in today for the above mentioned reasons. Past Medical History is significant for: Asthma, impaired glucose tolerance.  At last visit she was seen for elevated blood pressure, was asked to do ambulatory measurements and return today for follow-up.  Blood pressure remains elevated in office today to 150/90 and this seems consistent with her home measurements.  She is feeling well and has no acute concerns or complaints.   Past Medical/Surgical History: Past Medical History:  Diagnosis Date   Allergy    Blood transfusion without reported diagnosis    Cancer (HCC)    cervical anc rectal   Chronic kidney disease    GERD (gastroesophageal reflux disease)    past hx but better wirg weight loss    History of kidney stones    seen on a scan no treatment    Mild intermittent asthma    seasonal or allery induced    Past Surgical History:  Procedure Laterality Date   BIOPSY  01/15/2020   Procedure: BIOPSY;  Surgeon: Wilhelmenia Aloha Raddle., MD;  Location: [facility] ENDOSCOPY;  Service: Gastroenterology;;   cervical cancer surgery  1995~   Dr Rox; [facility] surgery   COLONOSCOPY WITH PROPOFOL  N/A 01/15/2020   Procedure: COLONOSCOPY WITH PROPOFOL ;  Surgeon: Wilhelmenia Aloha Raddle., MD;  Location: [facility] ENDOSCOPY;  Service: Gastroenterology;  Laterality: N/A;   COLOSTOMY REVERSAL N/A 04/26/2020   Procedure: ILEOSTOMY REVERSAL;  Surgeon: Debby Hila, MD;  Location: [facility] ORS;  Service: General;  Laterality: N/A;   HAND SURGERY Right    SUBMUCOSAL LIFTING INJECTION  01/15/2020   Procedure: SUBMUCOSAL LIFTING INJECTION;  Surgeon: Wilhelmenia Aloha Raddle., MD;  Location: [facility] ENDOSCOPY;  Service: Gastroenterology;;   tubal preganancy     XI ROBOTIC ASSISTED LOWER ANTERIOR RESECTION N/A 02/15/2020   Procedure: ROBOTIC LOW ANTERIOR RESECTION, DIVERTING ILEOSTOMY;   Surgeon: Debby Hila, MD;  Location: [facility] ORS;  Service: General;  Laterality: N/A;    Social History:  reports that she quit smoking about 41 years ago. Her smoking use included cigarettes. She started smoking about 61 years ago. She has a 5 pack-year smoking history. She has never used smokeless tobacco. She reports that she does not drink alcohol and does not use drugs.  Allergies: Allergies[1]  Family History:  Family History  Problem Relation Age of Onset   CVA Mother    Lung cancer Father    Colon polyps Sister    Colon cancer Sister    Diverticulitis Sister    Cancer Brother 74       Prostate cancer    Stomach cancer Neg Hx    Rectal cancer Neg Hx    Pancreatic cancer Neg Hx    Liver disease Neg Hx    Esophageal cancer Neg Hx     Current Medications[2]  Review of Systems:  Negative unless indicated in HPI.   Physical Exam: Vitals:   08/14/24 0744 08/14/24 0747  BP: (!) 150/98 131/72  Pulse: (!) 59   Temp: 98 F (36.7 C)   TempSrc: Oral   SpO2: 100%   Weight: 198 lb 8 oz (90 kg)     Body mass index is 32.53 kg/m.   Physical Exam Vitals reviewed.  Constitutional:      Appearance: Normal appearance.  HENT:     Head: Normocephalic and atraumatic.  Eyes:  Conjunctiva/sclera: Conjunctivae normal.  Cardiovascular:     Rate and Rhythm: Normal rate and regular rhythm.  Pulmonary:     Effort: Pulmonary effort is normal.     Breath sounds: Normal breath sounds.  Skin:    General: Skin is warm and dry.  Neurological:     General: No focal deficit present.     Mental Status: She is alert and oriented to person, place, and time.  Psychiatric:        Mood and Affect: Mood normal.        Behavior: Behavior normal.        Thought Content: Thought content normal.        Judgment: Judgment normal.      Impression and Plan:  Primary hypertension -     amLODIPine  Besylate; Take 1 tablet (5 mg total) by mouth daily.  Dispense: 90 tablet; Refill:  1  Moderate persistent asthma with exacerbation -     Fluticasone -Salmeterol; Inhale 2 puffs into the lungs 2 (two) times daily.  Dispense: 1 each; Refill: 2 -     Montelukast  Sodium; Take 1 tablet (10 mg total) by mouth at bedtime.  Dispense: 90 tablet; Refill: 1  -I will make diagnosis of hypertension, start amlodipine  5 mg daily.  Advised to continue daily blood pressure measurement and return in 6 weeks for follow-up. - Asthma medications have been refilled as well.   Time spent:30 minutes reviewing chart, interviewing and examining patient and formulating plan of care.     Tully Theophilus Andrews, MD [facility]     [1]  Allergies Allergen Reactions   Hydrocodone  Itching    With anxiety  [2]  Current Outpatient Medications:    albuterol  (VENTOLIN  HFA) 108 (90 Base) MCG/ACT inhaler, USE 2 INHALATIONS BY MOUTH EVERY 6 HOURS AS NEEDED FOR WHEEZING  AND SHORTNESS OF BREATH, Disp: 26.8 g, Rfl: 2   amLODipine  (NORVASC ) 5 MG tablet, Take 1 tablet (5 mg total) by mouth daily., Disp: 90 tablet, Rfl: 1   BLACK ELDERBERRY PO, Take 500 mg by mouth daily., Disp: , Rfl:    cholecalciferol (VITAMIN D3) 25 MCG (1000 UNIT) tablet, Take 1,000 Units by mouth daily., Disp: , Rfl:    loratadine (CLARITIN) 10 MG tablet, Take 10 mg by mouth daily., Disp: , Rfl:    Magnesium 100 MG CAPS, Take by mouth daily at 12 noon., Disp: , Rfl:    Multiple Vitamin (MULTIVITAMIN WITH MINERALS) TABS tablet, Take 1 tablet by mouth daily., Disp: , Rfl:    OVER THE COUNTER MEDICATION, daily at 6 (six) AM. Collagen, Disp: , Rfl:    fluticasone -salmeterol (ADVAIR HFA) 115-21 MCG/ACT inhaler, Inhale 2 puffs into the lungs 2 (two) times daily., Disp: 1 each, Rfl: 2   montelukast  (SINGULAIR ) 10 MG tablet, Take 1 tablet (10 mg total) by mouth at bedtime., Disp: 90 tablet, Rfl: 1  Current Facility-Administered Medications:    0.9 %  sodium chloride  infusion, 500 mL, Intravenous, Once, Nandigam,  Kavitha V, MD  "

## 2024-08-22 ENCOUNTER — Telehealth: Payer: Self-pay | Admitting: *Deleted

## 2024-08-22 ENCOUNTER — Encounter: Admitting: Internal Medicine

## 2024-08-22 NOTE — Telephone Encounter (Signed)
 Advair is no longer covered by insurance.  The covered alternatives are:  Symbicort , breo;fluticasone /salmeterol, dulera.  Would you like one of the alternatives or start a PA?

## 2024-08-23 MED ORDER — BUDESONIDE-FORMOTEROL FUMARATE 160-4.5 MCG/ACT IN AERO: 2.0000 | INHALATION_SPRAY | Freq: Two times a day (BID) | RESPIRATORY_TRACT | 3 refills | Status: AC

## 2024-08-23 NOTE — Telephone Encounter (Signed)
 Left message on machine for patient to return our call

## 2024-09-11 ENCOUNTER — Ambulatory Visit: Admitting: Internal Medicine

## 2024-09-25 ENCOUNTER — Ambulatory Visit: Admitting: Internal Medicine

## 2024-10-10 ENCOUNTER — Encounter: Admitting: Internal Medicine

## 2024-12-20 ENCOUNTER — Encounter: Admitting: Internal Medicine
